# Patient Record
Sex: Female | Born: 1960
Health system: Southern US, Community
[De-identification: ages and names within clinical notes are randomized; demographics above are authoritative.]

## PROBLEM LIST (undated history)

## (undated) DIAGNOSIS — I499 Cardiac arrhythmia, unspecified: Secondary | ICD-10-CM

## (undated) DIAGNOSIS — R569 Unspecified convulsions: Secondary | ICD-10-CM

## (undated) DIAGNOSIS — C801 Malignant (primary) neoplasm, unspecified: Secondary | ICD-10-CM

## (undated) DIAGNOSIS — E079 Disorder of thyroid, unspecified: Secondary | ICD-10-CM

## (undated) DIAGNOSIS — E039 Hypothyroidism, unspecified: Secondary | ICD-10-CM

## (undated) DIAGNOSIS — T753XXA Motion sickness, initial encounter: Secondary | ICD-10-CM

## (undated) HISTORY — PX: BREAST CYST ASPIRATION: SHX578

## (undated) HISTORY — PX: VAGINAL HYSTERECTOMY: SUR661

## (undated) HISTORY — PX: APPENDECTOMY: SHX54

## (undated) HISTORY — PX: THYROIDECTOMY: SHX17

## (undated) HISTORY — DX: Disorder of thyroid, unspecified: E07.9

## (undated) HISTORY — DX: Unspecified convulsions: R56.9

---

## 2014-03-07 ENCOUNTER — Ambulatory Visit: Payer: Self-pay | Admitting: Emergency Medicine

## 2014-12-07 ENCOUNTER — Ambulatory Visit (INDEPENDENT_AMBULATORY_CARE_PROVIDER_SITE_OTHER): Payer: 59 | Admitting: Family Medicine

## 2014-12-07 ENCOUNTER — Encounter: Payer: Self-pay | Admitting: Family Medicine

## 2014-12-07 VITALS — BP 130/80 | HR 72 | Ht 67.0 in | Wt 240.0 lb

## 2014-12-07 DIAGNOSIS — Z1211 Encounter for screening for malignant neoplasm of colon: Secondary | ICD-10-CM

## 2014-12-07 DIAGNOSIS — G40A09 Absence epileptic syndrome, not intractable, without status epilepticus: Secondary | ICD-10-CM

## 2014-12-07 DIAGNOSIS — R69 Illness, unspecified: Secondary | ICD-10-CM

## 2014-12-07 DIAGNOSIS — E039 Hypothyroidism, unspecified: Secondary | ICD-10-CM

## 2014-12-07 DIAGNOSIS — Z1239 Encounter for other screening for malignant neoplasm of breast: Secondary | ICD-10-CM | POA: Diagnosis not present

## 2014-12-07 MED ORDER — LEVOTHYROXINE SODIUM 150 MCG PO TABS: 150.0000 ug | ORAL_TABLET | ORAL | Status: DC

## 2014-12-07 MED ORDER — DIVALPROEX SODIUM 125 MG PO DR TAB: 125.0000 mg | DELAYED_RELEASE_TABLET | Freq: Every day | ORAL | Status: DC

## 2014-12-07 MED ORDER — DIVALPROEX SODIUM ER 500 MG PO TB24: 500.0000 mg | ORAL_TABLET | Freq: Every day | ORAL | Status: DC

## 2014-12-07 MED ORDER — LEVOTHYROXINE SODIUM 175 MCG PO TABS: 175.0000 ug | ORAL_TABLET | ORAL | Status: DC

## 2014-12-07 NOTE — Progress Notes (Signed)
Name: Karen Franco   MRN: [id number]    DOB: 1960/11/19   Date:12/07/2014       Progress Note  Subjective  Chief Complaint  Chief Complaint  Patient presents with  . Seizures  . Thyroid Problem    Seizures  This is a chronic problem. The problem has not changed (stable) since onset.Associated symptoms include confusion and nausea. Pertinent negatives include no headaches, no sore throat, no chest pain, no cough and no diarrhea. nausea/ uable to speak/ postictal stage The seizure(s) had no focality.  Thyroid Problem Presents for follow-up visit. Symptoms include weight gain. Patient reports no anxiety, cold intolerance, constipation, depressed mood, diaphoresis, diarrhea, dry skin, fatigue, hair loss, heat intolerance, hoarse voice, leg swelling, nail problem, palpitations, tremors, visual change or weight loss. The symptoms have been stable. Past treatments include levothyroxine. There are no known risk factors.    No problem-specific assessment & plan notes found for this encounter.   Past Medical History  Diagnosis Date  . Thyroid disease   . Seizures     Past Surgical History  Procedure Laterality Date  . Vaginal hysterectomy    . Cesarean section      x 2  . Appendectomy    . Thyroidectomy      Family History  Problem Relation Age of Onset  . Cancer Mother   . Heart disease Father   . Diabetes Maternal Grandfather     History   Social History  . Marital Status: Married    Spouse Name: N/A  . Number of Children: N/A  . Years of Education: N/A   Occupational History  . Not on file.   Social History Main Topics  . Smoking status: Never Smoker   . Smokeless tobacco: Not on file  . Alcohol Use: 0.0 oz/week    0 Standard drinks or equivalent per week  . Drug Use: No  . Sexual Activity: Yes   Other Topics Concern  . Not on file   Social History Narrative  . No narrative on file    No Known Allergies   Review of Systems  Constitutional:  Positive for weight gain. Negative for fever, chills, weight loss, malaise/fatigue, diaphoresis and fatigue.  HENT: Negative for ear discharge, ear pain, hoarse voice and sore throat.   Eyes: Negative for blurred vision.  Respiratory: Negative for cough, sputum production, shortness of breath and wheezing.   Cardiovascular: Negative for chest pain, palpitations and leg swelling.  Gastrointestinal: Positive for nausea. Negative for heartburn, abdominal pain, diarrhea, constipation, blood in stool and melena.  Genitourinary: Negative for dysuria, urgency, frequency and hematuria.  Musculoskeletal: Positive for back pain. Negative for myalgias, joint pain and neck pain.  Skin: Negative for rash.  Neurological: Positive for seizures. Negative for dizziness, tingling, tremors, sensory change, focal weakness and headaches.  Endo/Heme/Allergies: Negative for environmental allergies, cold intolerance, heat intolerance and polydipsia. Does not bruise/bleed easily.  Psychiatric/Behavioral: Positive for confusion. Negative for depression and suicidal ideas. The patient is not nervous/anxious and does not have insomnia.      Objective  Filed Vitals:   12/07/14 0809  BP: 130/80  Pulse: 72  Height: 5\' 7"  (1.702 m)  Weight: 240 lb (108.863 kg)    Physical Exam  Constitutional: She is well-developed, well-nourished, and in no distress. No distress.  HENT:  Head: Normocephalic and atraumatic.  Right Ear: External ear normal.  Left Ear: External ear normal.  Nose: Nose normal.  Mouth/Throat: Oropharynx is clear and moist.  Eyes: Conjunctivae and EOM are normal. Pupils are equal, round, and reactive to light. Right eye exhibits no discharge. Left eye exhibits no discharge.  Neck: Normal range of motion. Neck supple. No JVD present. No thyromegaly present.  Cardiovascular: Normal rate, regular rhythm, normal heart sounds and intact distal pulses.  Exam reveals no gallop and no friction rub.   No  murmur heard. Pulmonary/Chest: Effort normal and breath sounds normal. Right breast exhibits no inverted nipple, no mass and no skin change. Left breast exhibits no inverted nipple, no mass and no skin change. Breasts are symmetrical.  Abdominal: Soft. Bowel sounds are normal. She exhibits no mass. There is no tenderness. There is no guarding.  Musculoskeletal: Normal range of motion. She exhibits no edema.  Lymphadenopathy:    She has no cervical adenopathy.  Neurological: She is alert. She has normal reflexes.  Skin: Skin is warm and dry. She is not diaphoretic.  Psychiatric: Mood and affect normal.      Assessment & Plan  Problem List Items Addressed This Visit      Endocrine   Acquired hypothyroidism - Primary   Relevant Medications   levothyroxine (SYNTHROID, LEVOTHROID) 150 MCG tablet   levothyroxine (SYNTHROID, LEVOTHROID) 175 MCG tablet   Other Relevant Orders   TSH     Nervous and Auditory   Absence seizure disorder   Relevant Medications   divalproex (DEPAKOTE ER) 500 MG 24 hr tablet   divalproex (DEPAKOTE) 125 MG DR tablet   Other Relevant Orders   Valproic Acid level     Other   Taking medication for chronic disease   Relevant Orders   Renal Function Panel   Lipid Profile   CBC   Hepatic function panel   Valproic Acid level    Other Visit Diagnoses    Breast cancer screening        Relevant Orders    MM Digital Screening    Colon cancer screening        Relevant Orders    Ambulatory referral to Gastroenterology         Dr. Otilio Miu [facility]  12/07/2014

## 2014-12-10 LAB — HEPATIC FUNCTION PANEL
ALT: 22 IU/L (ref 0–32)
AST: 19 IU/L (ref 0–40)
Alkaline Phosphatase: 84 IU/L (ref 39–117)
Bilirubin Total: 0.3 mg/dL (ref 0.0–1.2)
Bilirubin, Direct: 0.08 mg/dL (ref 0.00–0.40)
Total Protein: 6.4 g/dL (ref 6.0–8.5)

## 2014-12-10 LAB — RENAL FUNCTION PANEL
Albumin: 3.8 g/dL (ref 3.5–5.5)
BUN/Creatinine Ratio: 19 (ref 9–23)
BUN: 12 mg/dL (ref 6–24)
CO2: 26 mmol/L (ref 18–29)
Calcium: 9.2 mg/dL (ref 8.7–10.2)
Chloride: 97 mmol/L (ref 97–108)
Creatinine, Ser: 0.63 mg/dL (ref 0.57–1.00)
GFR calc Af Amer: 119 mL/min/{1.73_m2} (ref >59)
GFR, EST NON AFRICAN AMERICAN: 103 mL/min/{1.73_m2} (ref >59)
Glucose: 83 mg/dL (ref 65–99)
Phosphorus: 3.5 mg/dL (ref 2.5–4.5)
Potassium: 5.3 mmol/L — ABNORMAL HIGH (ref 3.5–5.2)
SODIUM: 138 mmol/L (ref 134–144)

## 2014-12-10 LAB — LIPID PANEL
CHOLESTEROL TOTAL: 257 mg/dL — AB (ref 100–199)
Chol/HDL Ratio: 3.9 ratio units (ref 0.0–4.4)
HDL: 66 mg/dL (ref >39)
LDL Calculated: 171 mg/dL — ABNORMAL HIGH (ref 0–99)
Triglycerides: 101 mg/dL (ref 0–149)
VLDL Cholesterol Cal: 20 mg/dL (ref 5–40)

## 2014-12-10 LAB — CBC
HEMOGLOBIN: 14 g/dL (ref 11.1–15.9)
Hematocrit: 42.4 % (ref 34.0–46.6)
MCH: 28.9 pg (ref 26.6–33.0)
MCHC: 33 g/dL (ref 31.5–35.7)
MCV: 88 fL (ref 79–97)
Platelets: 418 10*3/uL — ABNORMAL HIGH (ref 150–379)
RBC: 4.84 x10E6/uL (ref 3.77–5.28)
RDW: 14 % (ref 12.3–15.4)
WBC: 7.8 10*3/uL (ref 3.4–10.8)

## 2014-12-10 LAB — VALPROIC ACID LEVEL: Valproic Acid Lvl: 24 ug/mL — ABNORMAL LOW (ref 50–100)

## 2014-12-10 LAB — TSH: TSH: 0.509 u[IU]/mL (ref 0.450–4.500)

## 2014-12-14 ENCOUNTER — Telehealth: Payer: Self-pay | Admitting: Gastroenterology

## 2014-12-14 ENCOUNTER — Ambulatory Visit
Admission: RE | Admit: 2014-12-14 | Discharge: 2014-12-14 | Disposition: A | Discharge status: Home / Self Care | Payer: 59 | Source: Ambulatory Visit | Attending: Family Medicine | Admitting: Family Medicine

## 2014-12-14 DIAGNOSIS — Z1239 Encounter for other screening for malignant neoplasm of breast: Secondary | ICD-10-CM

## 2014-12-14 DIAGNOSIS — Z1231 Encounter for screening mammogram for malignant neoplasm of breast: Secondary | ICD-10-CM | POA: Diagnosis present

## 2014-12-14 NOTE — Telephone Encounter (Signed)
Patient returning your phone call regarding colonoscopy triage

## 2014-12-16 ENCOUNTER — Other Ambulatory Visit: Payer: Self-pay

## 2014-12-19 NOTE — Telephone Encounter (Signed)
Phoned patient for colon triage, No answer will mail letter

## 2014-12-26 ENCOUNTER — Other Ambulatory Visit: Payer: Self-pay

## 2014-12-26 DIAGNOSIS — Z1212 Encounter for screening for malignant neoplasm of rectum: Principal | ICD-10-CM

## 2014-12-26 DIAGNOSIS — Z1211 Encounter for screening for malignant neoplasm of colon: Secondary | ICD-10-CM

## 2014-12-26 NOTE — Progress Notes (Signed)
Gastroenterology Pre-Procedure Review  Request Date: 01-20-15 Requesting Physician: Dr. Otilio Miu  PATIENT REVIEW QUESTIONS: The patient responded to the following health history questions as indicated:    1. Are you having any GI issues? no 2. Do you have a personal history of Polyps? no 3. Do you have a family history of Colon Cancer or Polyps? no 4. Diabetes Mellitus? no 5. Joint replacements in the past 12 months?no 6. Major health problems in the past 3 months?no 7. Any artificial heart valves, MVP, or defibrillator?no    MEDICATIONS & ALLERGIES:    Patient reports the following regarding taking any anticoagulation/antiplatelet therapy:   Plavix, Coumadin, Eliquis, Xarelto, Lovenox, Pradaxa, Brilinta, or Effient? no Aspirin? no  Patient confirms/reports the following medications:  Current Outpatient Prescriptions  Medication Sig Dispense Refill  . Calcium Carbonate-Vitamin D (CALCIUM-VITAMIN D) 500-200 MG-UNIT per tablet Take 1 tablet by mouth daily.    . divalproex (DEPAKOTE ER) 500 MG 24 hr tablet Take 1 tablet (500 mg total) by mouth daily. 30 tablet 5  . divalproex (DEPAKOTE) 125 MG DR tablet Take 1 tablet (125 mg total) by mouth daily. 30 tablet 5  . levothyroxine (SYNTHROID, LEVOTHROID) 150 MCG tablet Take 1 tablet (150 mcg total) by mouth 3 (three) times a week. 22 tablet 5  . levothyroxine (SYNTHROID, LEVOTHROID) 175 MCG tablet Take 1 tablet (175 mcg total) by mouth 4 (four) times a week. 30 tablet 5  . Multiple Vitamin (MULTIVITAMIN) capsule Take 1 capsule by mouth daily.    . vitamin B-12 (CYANOCOBALAMIN) 1000 MCG tablet Take 1,000 mcg by mouth daily.     No current facility-administered medications for this visit.    Patient confirms/reports the following allergies:  No Known Allergies  No orders of the defined types were placed in this encounter.    AUTHORIZATION INFORMATION Primary Insurance: 1D#: Group #:  Secondary Insurance: 1D#: Group  #:  SCHEDULE INFORMATION: Date: 01-20-15 Time: Location: [place]

## 2015-01-05 ENCOUNTER — Other Ambulatory Visit: Payer: Self-pay

## 2015-01-10 ENCOUNTER — Encounter: Payer: Self-pay | Admitting: *Deleted

## 2015-01-18 NOTE — Discharge Instructions (Signed)

## 2015-01-20 ENCOUNTER — Other Ambulatory Visit: Payer: Self-pay | Admitting: Gastroenterology

## 2015-01-20 ENCOUNTER — Ambulatory Visit: Payer: 59 | Admitting: Anesthesiology

## 2015-01-20 ENCOUNTER — Encounter
Admission: RE | Disposition: A | Discharge status: Home / Self Care | Payer: Self-pay | Source: Ambulatory Visit | Attending: Gastroenterology

## 2015-01-20 ENCOUNTER — Ambulatory Visit
Admission: RE | Admit: 2015-01-20 | Discharge: 2015-01-20 | Disposition: A | Discharge status: Home / Self Care | Payer: 59 | Source: Ambulatory Visit | Attending: Gastroenterology | Admitting: Gastroenterology

## 2015-01-20 DIAGNOSIS — R002 Palpitations: Secondary | ICD-10-CM | POA: Diagnosis not present

## 2015-01-20 DIAGNOSIS — D122 Benign neoplasm of ascending colon: Secondary | ICD-10-CM | POA: Insufficient documentation

## 2015-01-20 DIAGNOSIS — Z803 Family history of malignant neoplasm of breast: Secondary | ICD-10-CM | POA: Insufficient documentation

## 2015-01-20 DIAGNOSIS — Z79899 Other long term (current) drug therapy: Secondary | ICD-10-CM | POA: Insufficient documentation

## 2015-01-20 DIAGNOSIS — K640 First degree hemorrhoids: Secondary | ICD-10-CM | POA: Diagnosis not present

## 2015-01-20 DIAGNOSIS — E039 Hypothyroidism, unspecified: Secondary | ICD-10-CM | POA: Insufficient documentation

## 2015-01-20 DIAGNOSIS — Z833 Family history of diabetes mellitus: Secondary | ICD-10-CM | POA: Diagnosis not present

## 2015-01-20 DIAGNOSIS — Z6837 Body mass index (BMI) 37.0-37.9, adult: Secondary | ICD-10-CM | POA: Insufficient documentation

## 2015-01-20 DIAGNOSIS — Z8249 Family history of ischemic heart disease and other diseases of the circulatory system: Secondary | ICD-10-CM | POA: Insufficient documentation

## 2015-01-20 DIAGNOSIS — Z90710 Acquired absence of both cervix and uterus: Secondary | ICD-10-CM | POA: Diagnosis not present

## 2015-01-20 DIAGNOSIS — Z1211 Encounter for screening for malignant neoplasm of colon: Secondary | ICD-10-CM | POA: Diagnosis not present

## 2015-01-20 HISTORY — PX: POLYPECTOMY: SHX5525

## 2015-01-20 HISTORY — DX: Hypothyroidism, unspecified: E03.9

## 2015-01-20 HISTORY — DX: Motion sickness, initial encounter: T75.3XXA

## 2015-01-20 HISTORY — DX: Cardiac arrhythmia, unspecified: I49.9

## 2015-01-20 HISTORY — PX: COLONOSCOPY WITH PROPOFOL: SHX5780

## 2015-01-20 SURGERY — COLONOSCOPY WITH PROPOFOL
Anesthesia: Monitor Anesthesia Care | Wound class: Contaminated

## 2015-01-20 MED ORDER — SODIUM CHLORIDE 0.9 % IV SOLN: INTRAVENOUS | Status: DC

## 2015-01-20 MED ORDER — LIDOCAINE HCL (CARDIAC) 20 MG/ML IV SOLN
INTRAVENOUS | Status: DC | PRN
Start: 2015-01-20 — End: 2015-01-20
  Administered 2015-01-20: 50 mg via INTRAVENOUS

## 2015-01-20 MED ORDER — OXYCODONE HCL 5 MG PO TABS: 5.0000 mg | ORAL_TABLET | Freq: Once | ORAL | Status: DC | PRN

## 2015-01-20 MED ORDER — PROPOFOL 10 MG/ML IV BOLUS
INTRAVENOUS | Status: DC | PRN
  Administered 2015-01-20: 40 mg via INTRAVENOUS
  Administered 2015-01-20: 60 mg via INTRAVENOUS
  Administered 2015-01-20: 20 mg via INTRAVENOUS
  Administered 2015-01-20 (×4): 30 mg via INTRAVENOUS

## 2015-01-20 MED ORDER — LACTATED RINGERS IV SOLN
INTRAVENOUS | Status: DC
  Administered 2015-01-20: 09:00:00 via INTRAVENOUS

## 2015-01-20 MED ORDER — OXYCODONE HCL 5 MG/5ML PO SOLN: 5.0000 mg | Freq: Once | ORAL | Status: DC | PRN

## 2015-01-20 MED ORDER — STERILE WATER FOR IRRIGATION IR SOLN
Status: DC | PRN
  Administered 2015-01-20: 09:00:00

## 2015-01-20 SURGICAL SUPPLY — 28 items
CANISTER SUCT 1200ML W/VALVE (MISCELLANEOUS) ×4 IMPLANT
FCP ESCP3.2XJMB 240X2.8X (MISCELLANEOUS)
FORCEPS BIOP RAD 4 LRG CAP 4 (CUTTING FORCEPS) IMPLANT
FORCEPS BIOP RJ4 240 W/NDL (MISCELLANEOUS)
FORCEPS ESCP3.2XJMB 240X2.8X (MISCELLANEOUS) IMPLANT
GOWN CVR UNV OPN BCK APRN NK (MISCELLANEOUS) ×4 IMPLANT
GOWN ISOL THUMB LOOP REG UNIV (MISCELLANEOUS) ×4
HEMOCLIP INSTINCT (CLIP) IMPLANT
INJECTOR VARIJECT VIN23 (MISCELLANEOUS) IMPLANT
KIT CO2 TUBING (TUBING) IMPLANT
KIT DEFENDO VALVE AND CONN (KITS) IMPLANT
KIT ENDO PROCEDURE OLY (KITS) ×4 IMPLANT
LIGATOR MULTIBAND 6SHOOTER MBL (MISCELLANEOUS) IMPLANT
MARKER SPOT ENDO TATTOO 5ML (MISCELLANEOUS) IMPLANT
PAD GROUND ADULT SPLIT (MISCELLANEOUS) IMPLANT
SNARE SHORT THROW 13M SML OVAL (MISCELLANEOUS) ×4 IMPLANT
SNARE SHORT THROW 30M LRG OVAL (MISCELLANEOUS) IMPLANT
SPOT EX ENDOSCOPIC TATTOO (MISCELLANEOUS)
SUCTION POLY TRAP 4CHAMBER (MISCELLANEOUS) IMPLANT
TRAP SUCTION POLY (MISCELLANEOUS) ×4 IMPLANT
TUBING CONN 6MMX3.1M (TUBING)
TUBING SUCTION CONN 0.25 STRL (TUBING) IMPLANT
UNDERPAD 30X60 958B10 (PK) (MISCELLANEOUS) IMPLANT
VALVE BIOPSY ENDO (VALVE) IMPLANT
VARIJECT INJECTOR VIN23 (MISCELLANEOUS)
WATER AUXILLARY (MISCELLANEOUS) IMPLANT
WATER STERILE IRR 250ML POUR (IV SOLUTION) ×4 IMPLANT
WATER STERILE IRR 500ML POUR (IV SOLUTION) IMPLANT

## 2015-01-20 NOTE — Anesthesia Procedure Notes (Signed)
Procedure Name: MAC Performed by: Syretta Kochel Pre-anesthesia Checklist: Patient identified, Emergency Drugs available, Suction available, Timeout performed and Patient being monitored Patient Re-evaluated:Patient Re-evaluated prior to inductionOxygen Delivery Method: Nasal cannula Placement Confirmation: positive ETCO2       

## 2015-01-20 NOTE — Anesthesia Preprocedure Evaluation (Signed)
Anesthesia Evaluation  Patient identified by MRN, date of birth, ID band  Reviewed: NPO status   History of Anesthesia Complications Negative for: history of anesthetic complications  Airway Mallampati: II  TM Distance: >3 FB Neck ROM: full    Dental no notable dental hx.    Pulmonary neg pulmonary ROS,    Pulmonary exam normal        Cardiovascular Exercise Tolerance: Good negative cardio ROS Normal cardiovascular exam     Neuro/Psych Seizures - (petit mal sz under control),  negative psych ROS   GI/Hepatic negative GI ROS, Neg liver ROS,   Endo/Other  Hypothyroidism Morbid obesity (bmi=37)  Renal/GU negative Renal ROS  negative genitourinary   Musculoskeletal   Abdominal   Peds  Hematology negative hematology ROS (+)   Anesthesia Other Findings   Reproductive/Obstetrics                             Anesthesia Physical Anesthesia Plan  ASA: II  Anesthesia Plan: MAC   Post-op Pain Management:    Induction:   Airway Management Planned:   Additional Equipment:   Intra-op Plan:   Post-operative Plan:   Informed Consent: I have reviewed the patients History and Physical, chart, labs and discussed the procedure including the risks, benefits and alternatives for the proposed anesthesia with the patient or authorized representative who has indicated his/her understanding and acceptance.     Plan Discussed with: CRNA  Anesthesia Plan Comments:         Anesthesia Quick Evaluation

## 2015-01-20 NOTE — Op Note (Signed)
[facility] Patient Name: Karen Franco Procedure Date: 01/20/2015 9:18 AM MRN: [id number] Account #: [id number] Date of Birth: 01-13-1961 Admit Type: Outpatient Age: 54 Room: [facility] OR ROOM 01 Gender: Female Note Status: Finalized Procedure:         Colonoscopy Indications:       Screening for colorectal malignant neoplasm Providers:         Lucilla Lame, MD Referring MD:      Juline Patch, MD (Referring MD) Medicines:         Propofol per Anesthesia Complications:     No immediate complications. Procedure:         Pre-Anesthesia Assessment:                    - Prior to the procedure, a History and Physical was                     performed, and patient medications and allergies were                     reviewed. The patient's tolerance of previous anesthesia                     was also reviewed. The risks and benefits of the procedure                     and the sedation options and risks were discussed with the                     patient. All questions were answered, and informed consent                     was obtained. Prior Anticoagulants: The patient has taken                     no previous anticoagulant or antiplatelet agents. ASA                     Grade Assessment: II - A patient with mild systemic                     disease. After reviewing the risks and benefits, the                     patient was deemed in satisfactory condition to undergo                     the procedure.                    After obtaining informed consent, the colonoscope was                     passed under direct vision. Throughout the procedure, the                     patient's blood pressure, pulse, and oxygen saturations                     were monitored continuously. The was introduced through                     the anus and advanced to the the cecum, identified by  appendiceal orifice and ileocecal valve. The colonoscopy               was performed without difficulty. The patient tolerated                     the procedure well. The quality of the bowel preparation                     was excellent. Findings:      The perianal and digital rectal examinations were normal.      A 4 mm polyp was found in the ascending colon. The polyp was sessile.       The polyp was removed with a cold snare. Resection and retrieval were       complete.      Non-bleeding internal hemorrhoids were found during retroflexion. The       hemorrhoids were Grade I (internal hemorrhoids that do not prolapse). Impression:        - One 4 mm polyp in the ascending colon. Resected and                     retrieved.                    - Non-bleeding internal hemorrhoids. Recommendation:    - Await pathology results.                    - Repeat colonoscopy in 5 years if polyp adenoma and 10                     years if hyperplastic Procedure Code(s): --- Professional ---                    [phone], Colonoscopy, flexible; with removal of tumor(s),                     polyp(s), or other lesion(s) by snare technique Diagnosis Code(s): --- Professional ---                    Z12.11, Encounter for screening for malignant neoplasm of                     colon                    D12.2, Benign neoplasm of ascending colon CPT copyright 2014 American Medical Association. All rights reserved. The codes documented in this report are preliminary and upon coder review may  be revised to meet current compliance requirements. Lucilla Lame, MD 01/20/2015 9:42:25 AM This report has been signed electronically. Number of Addenda: 0 Note Initiated On: 01/20/2015 9:18 AM Scope Withdrawal Time: 0 hours 8 minutes 6 seconds  Total Procedure Duration: 0 hours 10 minutes 56 seconds       [facility]

## 2015-01-20 NOTE — Anesthesia Postprocedure Evaluation (Signed)
  Anesthesia Post-op Note  Patient: Karen Franco  Procedure(s) Performed: Procedure(s): COLONOSCOPY WITH PROPOFOL (N/A) POLYPECTOMY  Anesthesia type:MAC  Patient location: PACU  Post pain: Pain level controlled  Post assessment: Post-op Vital signs reviewed, Patient's Cardiovascular Status Stable, Respiratory Function Stable, Patent Airway and No signs of Nausea or vomiting  Post vital signs: Reviewed and stable  Last Vitals:  Filed Vitals:   01/20/15 0941  Pulse: 79  Temp: 36.4 C  Resp: 20    Level of consciousness: awake, alert  and patient cooperative  Complications: No apparent anesthesia complications

## 2015-01-20 NOTE — Transfer of Care (Signed)
Immediate Anesthesia Transfer of Care Note  Patient: Karen Franco  Procedure(s) Performed: Procedure(s): COLONOSCOPY WITH PROPOFOL (N/A) POLYPECTOMY  Patient Location: PACU  Anesthesia Type: MAC  Level of Consciousness: awake, alert  and patient cooperative  Airway and Oxygen Therapy: Patient Spontanous Breathing and Patient connected to supplemental oxygen  Post-op Assessment: Post-op Vital signs reviewed, Patient's Cardiovascular Status Stable, Respiratory Function Stable, Patent Airway and No signs of Nausea or vomiting  Post-op Vital Signs: Reviewed and stable  Complications: No apparent anesthesia complications

## 2015-01-20 NOTE — H&P (Signed)
[facility]  [address] [place] [employer], [place] [address] Phone: [phone] Fax : [phone]  Primary Care Physician:  Otilio Miu, MD Primary Gastroenterologist:  Dr. Allen Norris  Pre-Procedure History & Physical: HPI:  Karen Franco is a 54 y.o. female is here for a screening colonoscopy.   Past Medical History  Diagnosis Date  . Thyroid disease   . Seizures     22 yrs ago - controlled on Depakote  . Dysrhythmia     palpatations if thyroid meds are "off"  . Hypothyroidism   . Motion sickness     cars - back seat    Past Surgical History  Procedure Laterality Date  . Vaginal hysterectomy    . Cesarean section      x 2  . Appendectomy    . Thyroidectomy    . Breast cyst aspiration      not sure which side-neg    Prior to Admission medications   Medication Sig Start Date End Date Taking? Authorizing Provider  Calcium Carbonate-Vitamin D (CALCIUM-VITAMIN D) 500-200 MG-UNIT per tablet Take 1 tablet by mouth daily.   Yes Historical Provider, MD  divalproex (DEPAKOTE ER) 500 MG 24 hr tablet Take 1 tablet (500 mg total) by mouth daily. 12/07/14  Yes Juline Patch, MD  divalproex (DEPAKOTE) 125 MG DR tablet Take 1 tablet (125 mg total) by mouth daily. 12/07/14  Yes Juline Patch, MD  levothyroxine (SYNTHROID, LEVOTHROID) 150 MCG tablet Take 1 tablet (150 mcg total) by mouth 3 (three) times a week. 12/07/14  Yes Juline Patch, MD  levothyroxine (SYNTHROID, LEVOTHROID) 175 MCG tablet Take 1 tablet (175 mcg total) by mouth 4 (four) times a week. 12/07/14  Yes Juline Patch, MD  Multiple Vitamin (MULTIVITAMIN) capsule Take 1 capsule by mouth daily.   Yes Historical Provider, MD  vitamin B-12 (CYANOCOBALAMIN) 1000 MCG tablet Take 1,000 mcg by mouth daily.   Yes Historical Provider, MD    Allergies as of 12/26/2014  . (No Known Allergies)    Family History  Problem Relation Age of Onset  . Cancer Mother   . Heart disease Father   . Diabetes Maternal  Grandfather   . Breast cancer Paternal Aunt     Social History   Social History  . Marital Status: Married    Spouse Name: N/A  . Number of Children: N/A  . Years of Education: N/A   Occupational History  . Not on file.   Social History Main Topics  . Smoking status: Never Smoker   . Smokeless tobacco: Not on file  . Alcohol Use: 0.0 oz/week    0 Standard drinks or equivalent per week     Comment: 1 glass wine/month  . Drug Use: No  . Sexual Activity: Yes   Other Topics Concern  . Not on file   Social History Narrative    Review of Systems: See HPI, otherwise negative ROS  Physical Exam: Temp(Src) 97.9 F (36.6 C) (Tympanic)  Resp 16  Ht 5\' 7"  (1.702 m)  Wt 239 lb (108.41 kg)  BMI 37.42 kg/m2 General:   Alert,  pleasant and cooperative in NAD Head:  Normocephalic and atraumatic. Neck:  Supple; no masses or thyromegaly. Lungs:  Clear throughout to auscultation.    Heart:  Regular rate and rhythm. Abdomen:  Soft, nontender and nondistended. Normal bowel sounds, without guarding, and without rebound.   Neurologic:  Alert and  oriented x4;  grossly normal neurologically.  Impression/Plan: Karen Franco  Karen Franco is now here to undergo a screening colonoscopy.  Risks, benefits, and alternatives regarding colonoscopy have been reviewed with the patient.  Questions have been answered.  All parties agreeable.

## 2015-01-23 ENCOUNTER — Encounter: Payer: Self-pay | Admitting: Gastroenterology

## 2015-05-14 HISTORY — PX: MOHS SURGERY: SHX181

## 2015-06-09 ENCOUNTER — Ambulatory Visit
Admission: RE | Admit: 2015-06-09 | Discharge: 2015-06-09 | Disposition: A | Discharge status: Home / Self Care | Payer: 59 | Source: Ambulatory Visit | Attending: Family Medicine | Admitting: Family Medicine

## 2015-06-09 ENCOUNTER — Ambulatory Visit (INDEPENDENT_AMBULATORY_CARE_PROVIDER_SITE_OTHER): Payer: 59 | Admitting: Family Medicine

## 2015-06-09 ENCOUNTER — Encounter: Payer: Self-pay | Admitting: Family Medicine

## 2015-06-09 ENCOUNTER — Ambulatory Visit: Payer: Self-pay | Admitting: Family Medicine

## 2015-06-09 VITALS — BP 130/90 | HR 78 | Ht 67.0 in | Wt 249.0 lb

## 2015-06-09 DIAGNOSIS — E039 Hypothyroidism, unspecified: Secondary | ICD-10-CM | POA: Diagnosis not present

## 2015-06-09 DIAGNOSIS — M533 Sacrococcygeal disorders, not elsewhere classified: Secondary | ICD-10-CM | POA: Diagnosis present

## 2015-06-09 DIAGNOSIS — M519 Unspecified thoracic, thoracolumbar and lumbosacral intervertebral disc disorder: Secondary | ICD-10-CM

## 2015-06-09 DIAGNOSIS — G40909 Epilepsy, unspecified, not intractable, without status epilepticus: Secondary | ICD-10-CM | POA: Diagnosis not present

## 2015-06-09 DIAGNOSIS — G40A09 Absence epileptic syndrome, not intractable, without status epilepticus: Secondary | ICD-10-CM

## 2015-06-09 DIAGNOSIS — M4647 Discitis, unspecified, lumbosacral region: Secondary | ICD-10-CM | POA: Diagnosis present

## 2015-06-09 MED ORDER — DIVALPROEX SODIUM 125 MG PO DR TAB: 125.0000 mg | DELAYED_RELEASE_TABLET | Freq: Every day | ORAL | Status: DC

## 2015-06-09 MED ORDER — DIVALPROEX SODIUM ER 500 MG PO TB24: 500.0000 mg | ORAL_TABLET | Freq: Every day | ORAL | Status: DC

## 2015-06-09 MED ORDER — LEVOTHYROXINE SODIUM 175 MCG PO TABS: 175.0000 ug | ORAL_TABLET | ORAL | Status: DC

## 2015-06-09 MED ORDER — LEVOTHYROXINE SODIUM 150 MCG PO TABS: 150.0000 ug | ORAL_TABLET | ORAL | Status: DC

## 2015-06-09 NOTE — Progress Notes (Signed)
Name: Karen Franco   MRN: [id number]    DOB: 16-Jan-1961   Date:06/09/2015       Progress Note  Subjective  Chief Complaint  Chief Complaint  Patient presents with  . Seizures  . Hypothyroidism  . Back Pain    constant pain in tailbone area. Standing up helps pain    Seizures  This is a chronic problem. The current episode started more than 1 week ago. The problem has been gradually improving. Pertinent negatives include no sleepiness, no confusion, no headaches, no speech difficulty, no visual disturbance, no neck stiffness, no sore throat, no chest pain, no cough, no nausea, no vomiting, no diarrhea and no muscle weakness. Characteristics do not include eye blinking, eye deviation, bowel incontinence, bladder incontinence, rhythmic jerking, loss of consciousness, bit tongue, apnea or cyanosis. The seizure(s) had no focality. There has been no fever.  Back Pain This is a recurrent problem. The current episode started more than 1 year ago. The problem occurs daily. The problem has been gradually worsening since onset. The pain is present in the sacro-iliac and lumbar spine. The quality of the pain is described as aching. The pain is at a severity of 3/10. The symptoms are aggravated by bending. Pertinent negatives include no abdominal pain, bladder incontinence, bowel incontinence, chest pain, dysuria, fever, headaches, leg pain, numbness, paresis, pelvic pain, perianal numbness, tingling, weakness or weight loss. She has tried analgesics and chiropractic manipulation for the symptoms. The treatment provided no relief.  Thyroid Problem Patient reports no anxiety, cold intolerance, constipation, depressed mood, diaphoresis, diarrhea, dry skin, fatigue, hair loss, heat intolerance, hoarse voice, leg swelling, menstrual problem, nail problem, palpitations, tremors, visual change, weight gain or weight loss. The symptoms have been stable. Past treatments include nothing. The treatment provided  mild relief. There is no history of atrial fibrillation, dementia, diabetes, Graves' ophthalmopathy, heart failure, hyperlipidemia, neuropathy, obesity or osteopenia.    No problem-specific assessment & plan notes found for this encounter.   Past Medical History  Diagnosis Date  . Thyroid disease   . Seizures ([place])     22 yrs ago - controlled on Depakote  . Dysrhythmia     palpatations if thyroid meds are "off"  . Hypothyroidism   . Motion sickness     cars - back seat    Past Surgical History  Procedure Laterality Date  . Vaginal hysterectomy    . Cesarean section      x 2  . Appendectomy    . Thyroidectomy    . Breast cyst aspiration      not sure which side-neg  . Colonoscopy with propofol N/A 01/20/2015    Procedure: COLONOSCOPY WITH PROPOFOL;  Surgeon: Lucilla Lame, MD;  Location: [place];  Service: Endoscopy;  Laterality: N/A;  . Polypectomy  01/20/2015    Procedure: POLYPECTOMY;  Surgeon: Lucilla Lame, MD;  Location: [place];  Service: Endoscopy;;  . Mohs surgery  05/2015    L) ear    Family History  Problem Relation Age of Onset  . Cancer Mother   . Heart disease Father   . Diabetes Maternal Grandfather   . Breast cancer Paternal Aunt     Social History   Social History  . Marital Status: Married    Spouse Name: N/A  . Number of Children: N/A  . Years of Education: N/A   Occupational History  . Not on file.   Social History Main Topics  . Smoking status: Never Smoker   .  Smokeless tobacco: Not on file  . Alcohol Use: 0.0 oz/week    0 Standard drinks or equivalent per week     Comment: 1 glass wine/month  . Drug Use: No  . Sexual Activity: Yes   Other Topics Concern  . Not on file   Social History Narrative    No Known Allergies   Review of Systems  Constitutional: Negative for fever, chills, weight loss, weight gain, malaise/fatigue, diaphoresis and fatigue.  HENT: Negative for ear discharge, ear pain, hoarse voice  and sore throat.   Eyes: Negative for blurred vision and visual disturbance.  Respiratory: Negative for apnea, cough, sputum production, shortness of breath and wheezing.   Cardiovascular: Negative for chest pain, palpitations, leg swelling and cyanosis.  Gastrointestinal: Negative for heartburn, nausea, vomiting, abdominal pain, diarrhea, constipation, blood in stool, melena and bowel incontinence.  Genitourinary: Negative for bladder incontinence, dysuria, urgency, frequency, hematuria, menstrual problem and pelvic pain.  Musculoskeletal: Positive for back pain. Negative for myalgias, joint pain and neck pain.  Skin: Negative for rash.  Neurological: Positive for seizures. Negative for dizziness, tingling, tremors, sensory change, focal weakness, loss of consciousness, speech difficulty, weakness, numbness and headaches.  Endo/Heme/Allergies: Negative for environmental allergies, cold intolerance, heat intolerance and polydipsia. Does not bruise/bleed easily.  Psychiatric/Behavioral: Negative for depression, suicidal ideas and confusion. The patient is not nervous/anxious and does not have insomnia.      Objective  Filed Vitals:   06/09/15 1530  BP: 130/90  Pulse: 78  Height: 5\' 7"  (1.702 m)  Weight: 249 lb (112.946 kg)    Physical Exam  Constitutional: She is well-developed, well-nourished, and in no distress. No distress.  HENT:  Head: Normocephalic and atraumatic.  Right Ear: Tympanic membrane, external ear and ear canal normal.  Left Ear: Tympanic membrane, external ear and ear canal normal.  Nose: Nose normal.  Mouth/Throat: Oropharynx is clear and moist.  Eyes: Conjunctivae and EOM are normal. Pupils are equal, round, and reactive to light. Right eye exhibits no discharge. Left eye exhibits no discharge.  Neck: Normal range of motion. Neck supple. No JVD present. No thyromegaly present.  Cardiovascular: Normal rate, regular rhythm, normal heart sounds and intact distal  pulses.  Exam reveals no gallop and no friction rub.   No murmur heard. Pulmonary/Chest: Effort normal and breath sounds normal.  Abdominal: Soft. Bowel sounds are normal. She exhibits no mass. There is no tenderness. There is no guarding.  Musculoskeletal: Normal range of motion. She exhibits no edema.  Lymphadenopathy:    She has no cervical adenopathy.  Neurological: She is alert. She has normal reflexes.  Skin: Skin is warm and dry. She is not diaphoretic.  Psychiatric: Mood and affect normal.  Nursing note and vitals reviewed.     Assessment & Plan  Problem List Items Addressed This Visit      Endocrine   Acquired hypothyroidism - Primary   Relevant Medications   levothyroxine (SYNTHROID, LEVOTHROID) 150 MCG tablet   levothyroxine (SYNTHROID, LEVOTHROID) 175 MCG tablet   Other Relevant Orders   TSH    Other Visit Diagnoses    Seizure disorder ([place])        Sacroiliac joint pain        Relevant Orders    DG Lumbar Spine 2-3 Views    Lumbosacral disc disease        Relevant Orders    DG Lumbar Spine 2-3 Views    Absence epileptic syndrome, not intractable, without status epilepticus ([place])  Relevant Medications    divalproex (DEPAKOTE ER) 500 MG 24 hr tablet    divalproex (DEPAKOTE) 125 MG DR tablet         Dr. [facility]  06/09/2015

## 2015-06-09 NOTE — Patient Instructions (Signed)
Sacroiliac Joint Dysfunction Sacroiliac joint dysfunction is a condition that causes inflammation on one or both sides of the sacroiliac (SI) joint. The SI joint connects the lower part of the spine (sacrum) with the two upper portions of the pelvis (ilium). This condition causes deep aching or burning pain in the low back. In some cases, the pain may also spread into one or both buttocks or hips or spread down the legs. CAUSES This condition may be caused by:  Pregnancy. During pregnancy, extra stress is put on the SI joints because the pelvis widens.  Injury, such as:  Car accidents.  Sport-related injuries.  Work-related injuries.  Having one leg that is shorter than the other.  Conditions that affect the joints, such as:  Rheumatoid arthritis.  Gout.  Psoriatic arthritis.  Joint infection (septic arthritis). Sometimes, the cause of SI joint dysfunction is not known. SYMPTOMS Symptoms of this condition include:  Aching or burning pain in the lower back. The pain may also spread to other areas, such as:  Buttocks.  Groin.  Thighs and legs.  Muscle spasms in or around the painful areas.  Increased pain when standing, walking, running, stair climbing, bending, or lifting. DIAGNOSIS Your health care provider will do a physical exam and take your medical history. During the exam, the health care provider may move one or both of your legs to different positions to check for pain. Various tests may be done to help verify the diagnosis, including:  Imaging tests to look for other causes of pain. These may include:  MRI.  CT scan.  Bone scan.  Diagnostic injection. A numbing medicine is injected into the SI joint using a needle. If the pain is temporarily improved or stopped after the injection, this can indicate that SI joint dysfunction is the problem. TREATMENT Treatment may vary depending on the cause and severity of your condition. Treatment options may  include:  Applying ice or heat to the lower back area. This can help to reduce pain and muscle spasms.  Medicines to relieve pain or inflammation or to relax the muscles.  Wearing a back brace (sacroiliac brace) to help support the joint while your back is healing.  Physical therapy to increase muscle strength around the joint and flexibility at the joint. This may also involve learning proper body positions and ways of moving to relieve stress on the joint.  Direct manipulation of the SI joint.  Injections of steroid medicine into the joint in order to reduce pain and swelling.  Radiofrequency ablation to burn away nerves that are carrying pain messages from the joint.  Use of a device that provides electrical stimulation in order to reduce pain at the joint.  Surgery to put in screws and plates that limit or prevent joint motion. This is rare. HOME CARE INSTRUCTIONS  Rest as needed. Limit your activities as directed by your health care provider.  Take medicines only as directed by your health care provider.  If directed, apply ice to the affected area:  Put ice in a plastic bag.  Place a towel between your skin and the bag.  Leave the ice on for 20 minutes, 2-3 times per day.  Use a heating pad or a moist heat pack as directed by your health care provider.  Exercise as directed by your health care provider or physical therapist.  Keep all follow-up visits as directed by your health care provider. This is important. SEEK MEDICAL CARE IF:  Your pain is not controlled   with medicine.  You have a fever.  You have increasingly severe pain. SEEK IMMEDIATE MEDICAL CARE IF:  You have weakness, numbness, or tingling in your legs or feet.  You lose control of your bladder or bowel.   This information is not intended to replace advice given to you by your health care provider. Make sure you discuss any questions you have with your health care provider.   Document Released:  07/26/2008 Document Revised: 09/13/2014 Document Reviewed: 01/04/2014 Elsevier Interactive Patient Education 2016 [employer]  

## 2015-06-10 LAB — TSH: TSH: 1.33 u[IU]/mL (ref 0.450–4.500)

## 2015-06-25 ENCOUNTER — Other Ambulatory Visit: Payer: Self-pay | Admitting: Family Medicine

## 2015-11-07 ENCOUNTER — Ambulatory Visit (INDEPENDENT_AMBULATORY_CARE_PROVIDER_SITE_OTHER): Payer: 59 | Admitting: Family Medicine

## 2015-11-07 ENCOUNTER — Encounter: Payer: Self-pay | Admitting: Family Medicine

## 2015-11-07 VITALS — BP 120/88 | HR 72 | Ht 67.0 in | Wt 228.0 lb

## 2015-11-07 DIAGNOSIS — R69 Illness, unspecified: Secondary | ICD-10-CM | POA: Diagnosis not present

## 2015-11-07 DIAGNOSIS — Z1239 Encounter for other screening for malignant neoplasm of breast: Secondary | ICD-10-CM | POA: Diagnosis not present

## 2015-11-07 DIAGNOSIS — E039 Hypothyroidism, unspecified: Secondary | ICD-10-CM

## 2015-11-07 DIAGNOSIS — G40A09 Absence epileptic syndrome, not intractable, without status epilepticus: Secondary | ICD-10-CM

## 2015-11-07 MED ORDER — DIVALPROEX SODIUM 125 MG PO DR TAB: 125.0000 mg | DELAYED_RELEASE_TABLET | Freq: Every day | ORAL | Status: DC

## 2015-11-07 MED ORDER — LEVOTHYROXINE SODIUM 150 MCG PO TABS: 150.0000 ug | ORAL_TABLET | ORAL | Status: DC

## 2015-11-07 MED ORDER — DIVALPROEX SODIUM ER 500 MG PO TB24: 500.0000 mg | ORAL_TABLET | Freq: Every day | ORAL | Status: DC

## 2015-11-07 MED ORDER — LEVOTHYROXINE SODIUM 175 MCG PO TABS: 175.0000 ug | ORAL_TABLET | ORAL | Status: DC

## 2015-11-07 NOTE — Progress Notes (Signed)
Name: Karen Franco   MRN: [id number]    DOB: 02/21/1961   Date:11/07/2015       Progress Note  Subjective  Chief Complaint  Chief Complaint  Patient presents with  . Hypothyroidism  . Seizures    Seizures  This is a chronic problem. The problem has not changed since onset.Number of times: zero. Associated symptoms include speech difficulty and nausea. Pertinent negatives include no headaches, no sore throat, no chest pain, no cough and no diarrhea. nausea/ unable to talk There was the sensation of an aura present (taste/ euphoric memory).  Thyroid Problem Presents for follow-up visit. Symptoms include weight loss. Patient reports no anxiety, cold intolerance, constipation, depressed mood, diaphoresis, diarrhea, dry skin, fatigue, hair loss, heat intolerance, hoarse voice, leg swelling, nail problem, palpitations, tremors, visual change or weight gain. (Weight loss by design) The symptoms have been stable. Prior procedures include thyroidectomy.  Gastroesophageal Reflux She complains of nausea. She reports no abdominal pain, no chest pain, no coughing, no heartburn, no hoarse voice, no sore throat or no wheezing. Associated symptoms include weight loss. Pertinent negatives include no fatigue or melena.    No problem-specific assessment & plan notes found for this encounter.   Past Medical History  Diagnosis Date  . Thyroid disease   . Seizures ([place])     22 yrs ago - controlled on Depakote  . Dysrhythmia     palpatations if thyroid meds are "off"  . Hypothyroidism   . Motion sickness     cars - back seat    Past Surgical History  Procedure Laterality Date  . Vaginal hysterectomy    . Cesarean section      x 2  . Appendectomy    . Thyroidectomy    . Breast cyst aspiration      not sure which side-neg  . Colonoscopy with propofol N/A 01/20/2015    Procedure: COLONOSCOPY WITH PROPOFOL;  Surgeon: Lucilla Lame, MD;  Location: [place];  Service: Endoscopy;   Laterality: N/A;  . Polypectomy  01/20/2015    Procedure: POLYPECTOMY;  Surgeon: Lucilla Lame, MD;  Location: [place];  Service: Endoscopy;;  . Mohs surgery  05/2015    L) ear    Family History  Problem Relation Age of Onset  . Cancer Mother   . Heart disease Father   . Diabetes Maternal Grandfather   . Breast cancer Paternal Aunt     Social History   Social History  . Marital Status: Married    Spouse Name: N/A  . Number of Children: N/A  . Years of Education: N/A   Occupational History  . Not on file.   Social History Main Topics  . Smoking status: Never Smoker   . Smokeless tobacco: Not on file  . Alcohol Use: 0.0 oz/week    0 Standard drinks or equivalent per week     Comment: 1 glass wine/month  . Drug Use: No  . Sexual Activity: Yes   Other Topics Concern  . Not on file   Social History Narrative    No Known Allergies   Review of Systems  Constitutional: Positive for weight loss. Negative for fever, chills, weight gain, malaise/fatigue, diaphoresis and fatigue.  HENT: Negative for ear discharge, ear pain, hoarse voice and sore throat.   Eyes: Negative for blurred vision.  Respiratory: Negative for cough, sputum production, shortness of breath and wheezing.   Cardiovascular: Negative for chest pain, palpitations and leg swelling.  Gastrointestinal: Positive for  nausea. Negative for heartburn, abdominal pain, diarrhea, constipation, blood in stool and melena.  Genitourinary: Negative for dysuria, urgency, frequency and hematuria.  Musculoskeletal: Negative for myalgias, back pain, joint pain and neck pain.  Skin: Negative for rash.  Neurological: Positive for seizures and speech difficulty. Negative for dizziness, tingling, tremors, sensory change, focal weakness and headaches.  Endo/Heme/Allergies: Negative for environmental allergies, cold intolerance, heat intolerance and polydipsia. Does not bruise/bleed easily.  Psychiatric/Behavioral:  Negative for depression and suicidal ideas. The patient is not nervous/anxious and does not have insomnia.      Objective  Filed Vitals:   11/07/15 0805  BP: 120/88  Pulse: 72  Height: 5\' 7"  (1.702 m)  Weight: 228 lb (103.42 kg)    Physical Exam  Constitutional: She is well-developed, well-nourished, and in no distress. No distress.  HENT:  Head: Normocephalic and atraumatic.  Right Ear: External ear normal.  Left Ear: External ear normal.  Nose: Nose normal.  Mouth/Throat: Oropharynx is clear and moist.  Eyes: Conjunctivae and EOM are normal. Pupils are equal, round, and reactive to light. Right eye exhibits no discharge. Left eye exhibits no discharge.  Neck: Normal range of motion. Neck supple. No JVD present. No thyromegaly present.  Cardiovascular: Normal rate, regular rhythm, normal heart sounds and intact distal pulses.  Exam reveals no gallop and no friction rub.   No murmur heard. Pulmonary/Chest: Effort normal and breath sounds normal. She has no wheezes. She has no rales.  Abdominal: Soft. Bowel sounds are normal. She exhibits no mass. There is no hepatosplenomegaly. There is no tenderness. There is no guarding.  Musculoskeletal: Normal range of motion. She exhibits no edema.  Lymphadenopathy:    She has no cervical adenopathy.  Neurological: She is alert.  Skin: Skin is warm and dry. She is not diaphoretic.  Psychiatric: Mood and affect normal.  Nursing note and vitals reviewed.     Assessment & Plan  Problem List Items Addressed This Visit      Endocrine   Acquired hypothyroidism - Primary   Relevant Medications   levothyroxine (SYNTHROID, LEVOTHROID) 150 MCG tablet   levothyroxine (SYNTHROID, LEVOTHROID) 175 MCG tablet     Nervous and Auditory   Absence seizure disorder (HCC)   Relevant Medications   divalproex (DEPAKOTE ER) 500 MG 24 hr tablet   divalproex (DEPAKOTE) 125 MG DR tablet     Other   Taking medication for chronic disease   Relevant  Orders   Renal Function Panel   Hepatic Function Panel (6)   CBC    Other Visit Diagnoses    Absence epileptic syndrome, not intractable, without status epilepticus (HCC)        Relevant Medications    divalproex (DEPAKOTE ER) 500 MG 24 hr tablet    divalproex (DEPAKOTE) 125 MG DR tablet    Other Relevant Orders    Valproic Acid level    Renal Function Panel    Hepatic Function Panel (6)    CBC    Breast cancer screening        Relevant Orders    MM Digital Screening         Dr. Otilio Miu [facility]  11/07/2015

## 2015-11-08 LAB — CBC
Hematocrit: 43.7 % (ref 34.0–46.6)
Hemoglobin: 14.4 g/dL (ref 11.1–15.9)
MCH: 29.1 pg (ref 26.6–33.0)
MCHC: 33 g/dL (ref 31.5–35.7)
MCV: 88 fL (ref 79–97)
PLATELETS: 404 10*3/uL — AB (ref 150–379)
RBC: 4.95 x10E6/uL (ref 3.77–5.28)
RDW: 14.5 % (ref 12.3–15.4)
WBC: 7.4 10*3/uL (ref 3.4–10.8)

## 2015-11-08 LAB — HEPATIC FUNCTION PANEL (6)
ALK PHOS: 85 IU/L (ref 39–117)
ALT: 21 IU/L (ref 0–32)
AST: 16 IU/L (ref 0–40)
Bilirubin Total: 0.3 mg/dL (ref 0.0–1.2)
Bilirubin, Direct: 0.08 mg/dL (ref 0.00–0.40)

## 2015-11-08 LAB — RENAL FUNCTION PANEL
ALBUMIN: 4.1 g/dL (ref 3.5–5.5)
BUN/Creatinine Ratio: 18 (ref 9–23)
BUN: 12 mg/dL (ref 6–24)
CHLORIDE: 98 mmol/L (ref 96–106)
CO2: 23 mmol/L (ref 18–29)
CREATININE: 0.65 mg/dL (ref 0.57–1.00)
Calcium: 9.1 mg/dL (ref 8.7–10.2)
GFR, EST AFRICAN AMERICAN: 117 mL/min/{1.73_m2} (ref >59)
GFR, EST NON AFRICAN AMERICAN: 102 mL/min/{1.73_m2} (ref >59)
Glucose: 65 mg/dL (ref 65–99)
Phosphorus: 3.4 mg/dL (ref 2.5–4.5)
Potassium: 5.4 mmol/L — ABNORMAL HIGH (ref 3.5–5.2)
Sodium: 141 mmol/L (ref 134–144)

## 2015-11-08 LAB — VALPROIC ACID LEVEL: VALPROIC ACID LVL: 47 ug/mL — AB (ref 50–100)

## 2015-12-07 ENCOUNTER — Ambulatory Visit: Payer: Self-pay | Admitting: Family Medicine

## 2016-05-08 ENCOUNTER — Encounter: Payer: Self-pay | Admitting: Family Medicine

## 2016-05-08 ENCOUNTER — Ambulatory Visit (INDEPENDENT_AMBULATORY_CARE_PROVIDER_SITE_OTHER): Payer: 59 | Admitting: Family Medicine

## 2016-05-08 VITALS — BP 100/60 | HR 70 | Ht 67.0 in | Wt 216.0 lb

## 2016-05-08 DIAGNOSIS — E039 Hypothyroidism, unspecified: Secondary | ICD-10-CM

## 2016-05-08 DIAGNOSIS — Z23 Encounter for immunization: Secondary | ICD-10-CM

## 2016-05-08 DIAGNOSIS — G40A09 Absence epileptic syndrome, not intractable, without status epilepticus: Secondary | ICD-10-CM

## 2016-05-08 MED ORDER — LEVOTHYROXINE SODIUM 150 MCG PO TABS: 150.0000 ug | ORAL_TABLET | ORAL | 5 refills | Status: DC

## 2016-05-08 MED ORDER — LEVOTHYROXINE SODIUM 175 MCG PO TABS: 175.0000 ug | ORAL_TABLET | ORAL | 5 refills | Status: DC

## 2016-05-08 MED ORDER — DIVALPROEX SODIUM ER 500 MG PO TB24: 500.0000 mg | ORAL_TABLET | Freq: Every day | ORAL | 5 refills | Status: DC

## 2016-05-08 MED ORDER — DIVALPROEX SODIUM 125 MG PO DR TAB: 125.0000 mg | DELAYED_RELEASE_TABLET | Freq: Every day | ORAL | 5 refills | Status: DC

## 2016-05-08 NOTE — Progress Notes (Signed)
Name: Karen Franco   MRN: [id number]    DOB: 10/20/1960   Date:05/08/2016       Progress Note  Subjective  Chief Complaint  Chief Complaint  Patient presents with  . Hypothyroidism  . Seizures    Seizures   This is a chronic problem. The problem has been resolved (controlled on medications). Pertinent negatives include no sleepiness, no confusion, no headaches, no speech difficulty, no visual disturbance, no neck stiffness, no sore throat, no chest pain, no cough, no nausea, no vomiting, no diarrhea and no muscle weakness. There has been no fever.  Thyroid Problem  Presents for follow-up visit. Patient reports no anxiety, cold intolerance, constipation, depressed mood, diaphoresis, diarrhea, dry skin, fatigue, hair loss, heat intolerance, hoarse voice, leg swelling, menstrual problem, nail problem, palpitations, tremors, visual change, weight gain or weight loss. The symptoms have been stable.    No problem-specific Assessment & Plan notes found for this encounter.   Past Medical History:  Diagnosis Date  . Dysrhythmia    palpatations if thyroid meds are "off"  . Hypothyroidism   . Motion sickness    cars - back seat  . Seizures ([place])    22 yrs ago - controlled on Depakote  . Thyroid disease     Past Surgical History:  Procedure Laterality Date  . APPENDECTOMY    . BREAST CYST ASPIRATION     not sure which side-neg  . CESAREAN SECTION     x 2  . COLONOSCOPY WITH PROPOFOL N/A 01/20/2015   Procedure: COLONOSCOPY WITH PROPOFOL;  Surgeon: Lucilla Lame, MD;  Location: [place];  Service: Endoscopy;  Laterality: N/A;  . MOHS SURGERY  05/2015   L) ear  . POLYPECTOMY  01/20/2015   Procedure: POLYPECTOMY;  Surgeon: Lucilla Lame, MD;  Location: [place];  Service: Endoscopy;;  . THYROIDECTOMY    . VAGINAL HYSTERECTOMY      Family History  Problem Relation Age of Onset  . Cancer Mother   . Heart disease Father   . Diabetes Maternal Grandfather   .  Breast cancer Paternal Aunt     Social History   Social History  . Marital status: Married    Spouse name: N/A  . Number of children: N/A  . Years of education: N/A   Occupational History  . Not on file.   Social History Main Topics  . Smoking status: Never Smoker  . Smokeless tobacco: Not on file  . Alcohol use 0.0 oz/week     Comment: 1 glass wine/month  . Drug use: No  . Sexual activity: Yes   Other Topics Concern  . Not on file   Social History Narrative  . No narrative on file    No Known Allergies   Review of Systems  Constitutional: Negative for chills, diaphoresis, fatigue, fever, malaise/fatigue, weight gain and weight loss.  HENT: Negative for ear discharge, ear pain, hoarse voice and sore throat.   Eyes: Negative for blurred vision and visual disturbance.  Respiratory: Negative for cough, sputum production, shortness of breath and wheezing.   Cardiovascular: Negative for chest pain, palpitations and leg swelling.  Gastrointestinal: Negative for abdominal pain, blood in stool, constipation, diarrhea, heartburn, melena, nausea and vomiting.  Genitourinary: Negative for dysuria, frequency, hematuria, menstrual problem and urgency.  Musculoskeletal: Negative for back pain, joint pain, myalgias and neck pain.  Skin: Negative for rash.  Neurological: Positive for seizures. Negative for dizziness, tingling, tremors, sensory change, focal weakness, speech difficulty and  headaches.  Endo/Heme/Allergies: Negative for environmental allergies, cold intolerance, heat intolerance and polydipsia. Does not bruise/bleed easily.  Psychiatric/Behavioral: Negative for confusion, depression and suicidal ideas. The patient is not nervous/anxious and does not have insomnia.      Objective  Vitals:   05/08/16 0803  BP: 100/60  Pulse: 70  Weight: 216 lb (98 kg)  Height: 5\' 7"  (1.702 m)    Physical Exam  Constitutional: She is well-developed, well-nourished, and in no  distress. No distress.  HENT:  Head: Normocephalic and atraumatic.  Right Ear: External ear normal.  Left Ear: External ear normal.  Nose: Nose normal.  Mouth/Throat: Oropharynx is clear and moist.  Eyes: Conjunctivae and EOM are normal. Pupils are equal, round, and reactive to light. Right eye exhibits no discharge. Left eye exhibits no discharge.  Neck: Normal range of motion. Neck supple. No JVD present. No thyromegaly present.  Cardiovascular: Normal rate, regular rhythm, normal heart sounds and intact distal pulses.  Exam reveals no gallop and no friction rub.   No murmur heard. Pulmonary/Chest: Effort normal and breath sounds normal. She has no wheezes. She has no rales.  Abdominal: Soft. Bowel sounds are normal. She exhibits no mass. There is no tenderness. There is no guarding.  Musculoskeletal: Normal range of motion. She exhibits no edema, tenderness or deformity.  Lymphadenopathy:    She has no cervical adenopathy.  Neurological: She is alert. She has normal reflexes.  Skin: Skin is warm and dry. She is not diaphoretic.  Psychiatric: Mood and affect normal.  Nursing note and vitals reviewed.     Assessment & Plan  Problem List Items Addressed This Visit      Endocrine   Acquired hypothyroidism - Primary   Relevant Medications   levothyroxine (SYNTHROID, LEVOTHROID) 150 MCG tablet   levothyroxine (SYNTHROID, LEVOTHROID) 175 MCG tablet (Start on 05/09/2016)   Other Relevant Orders   Flu Vaccine QUAD 36+ mos PF IM (Fluarix & Fluzone Quad PF) (Completed)   TSH     Nervous and Auditory   Absence seizure disorder (HCC)   Relevant Medications   divalproex (DEPAKOTE) 125 MG DR tablet   divalproex (DEPAKOTE ER) 500 MG 24 hr tablet    Other Visit Diagnoses    Absence epileptic syndrome, not intractable, without status epilepticus (HCC)       Relevant Medications   divalproex (DEPAKOTE) 125 MG DR tablet   divalproex (DEPAKOTE ER) 500 MG 24 hr tablet   Immunization due        Relevant Orders   Tdap vaccine greater than or equal to 7yo IM (Completed)        Dr. [facility]  05/08/16

## 2016-05-09 LAB — TSH: TSH: 3.97 u[IU]/mL (ref 0.450–4.500)

## 2016-10-30 ENCOUNTER — Encounter: Payer: Self-pay | Admitting: Family Medicine

## 2016-10-30 ENCOUNTER — Ambulatory Visit (INDEPENDENT_AMBULATORY_CARE_PROVIDER_SITE_OTHER): Payer: 59 | Admitting: Family Medicine

## 2016-10-30 VITALS — BP 120/64 | HR 76 | Ht 67.0 in | Wt 201.0 lb

## 2016-10-30 DIAGNOSIS — E039 Hypothyroidism, unspecified: Secondary | ICD-10-CM | POA: Diagnosis not present

## 2016-10-30 DIAGNOSIS — G40A09 Absence epileptic syndrome, not intractable, without status epilepticus: Secondary | ICD-10-CM | POA: Diagnosis not present

## 2016-10-30 DIAGNOSIS — Z1159 Encounter for screening for other viral diseases: Secondary | ICD-10-CM

## 2016-10-30 DIAGNOSIS — Z114 Encounter for screening for human immunodeficiency virus [HIV]: Secondary | ICD-10-CM

## 2016-10-30 MED ORDER — DIVALPROEX SODIUM 125 MG PO DR TAB: 125.0000 mg | DELAYED_RELEASE_TABLET | Freq: Every day | ORAL | 11 refills | Status: DC

## 2016-10-30 MED ORDER — DIVALPROEX SODIUM ER 500 MG PO TB24: 500.0000 mg | ORAL_TABLET | Freq: Every day | ORAL | 11 refills | Status: DC

## 2016-10-30 MED ORDER — LEVOTHYROXINE SODIUM 175 MCG PO TABS: 175.0000 ug | ORAL_TABLET | ORAL | 11 refills | Status: DC

## 2016-10-30 MED ORDER — LEVOTHYROXINE SODIUM 150 MCG PO TABS: 150.0000 ug | ORAL_TABLET | ORAL | 11 refills | Status: DC

## 2016-10-30 NOTE — Progress Notes (Signed)
Name: Karen Franco   MRN: [id number]    DOB: 13-May-1961   Date:10/30/2016       Progress Note  Subjective  Chief Complaint  Chief Complaint  Patient presents with  . Hypothyroidism  . Seizures    Seizures   This is a chronic (last seizure > 91yrs) problem. The problem has not changed since onset.Associated symptoms include nausea. Pertinent negatives include no sleepiness, no confusion, no headaches, no speech difficulty, no visual disturbance, no neck stiffness, no sore throat, no chest pain, no cough, no vomiting, no diarrhea and no muscle weakness. nausea There was the sensation of an aura present.  Thyroid Problem  Presents for follow-up visit. Symptoms include cold intolerance and weight loss. Patient reports no anxiety, constipation, depressed mood, diaphoresis, diarrhea, dry skin, fatigue, hair loss, heat intolerance, hoarse voice, nail problem, palpitations or weight gain. (Loss by design) The symptoms have been stable.    No problem-specific Assessment & Plan notes found for this encounter.   Past Medical History:  Diagnosis Date  . Dysrhythmia    palpatations if thyroid meds are "off"  . Hypothyroidism   . Motion sickness    cars - back seat  . Seizures ([place])    22 yrs ago - controlled on Depakote  . Thyroid disease     Past Surgical History:  Procedure Laterality Date  . APPENDECTOMY    . BREAST CYST ASPIRATION     not sure which side-neg  . CESAREAN SECTION     x 2  . COLONOSCOPY WITH PROPOFOL N/A 01/20/2015   Procedure: COLONOSCOPY WITH PROPOFOL;  Surgeon: Lucilla Lame, MD;  Location: [facility];  Service: Endoscopy;  Laterality: N/A;  . MOHS SURGERY  05/2015   L) ear  . POLYPECTOMY  01/20/2015   Procedure: POLYPECTOMY;  Surgeon: Lucilla Lame, MD;  Location: [facility];  Service: Endoscopy;;  . THYROIDECTOMY    . VAGINAL HYSTERECTOMY      Family History  Problem Relation Age of Onset  . Cancer Mother   . Heart disease Father   .  Diabetes Maternal Grandfather   . Breast cancer Paternal Aunt     Social History   Social History  . Marital status: Married    Spouse name: N/A  . Number of children: N/A  . Years of education: N/A   Occupational History  . Not on file.   Social History Main Topics  . Smoking status: Never Smoker  . Smokeless tobacco: Never Used  . Alcohol use 0.0 oz/week     Comment: 1 glass wine/month  . Drug use: No  . Sexual activity: Yes   Other Topics Concern  . Not on file   Social History Narrative  . No narrative on file    No Known Allergies  Outpatient Medications Prior to Visit  Medication Sig Dispense Refill  . Calcium Carbonate-Vitamin D (CALCIUM-VITAMIN D) 500-200 MG-UNIT per tablet Take 1 tablet by mouth daily.    . cholecalciferol (VITAMIN D) 1000 units tablet Take 1,000 Units by mouth daily.    . Multiple Vitamin (MULTIVITAMIN) capsule Take 1 capsule by mouth daily.    . vitamin B-12 (CYANOCOBALAMIN) 1000 MCG tablet Take 1,000 mcg by mouth daily.    . divalproex (DEPAKOTE ER) 500 MG 24 hr tablet Take 1 tablet (500 mg total) by mouth daily. 30 tablet 5  . divalproex (DEPAKOTE) 125 MG DR tablet Take 1 tablet (125 mg total) by mouth daily. 30 tablet 5  .  levothyroxine (SYNTHROID, LEVOTHROID) 150 MCG tablet Take 1 tablet (150 mcg total) by mouth 3 (three) times a week. 22 tablet 5  . levothyroxine (SYNTHROID, LEVOTHROID) 175 MCG tablet Take 1 tablet (175 mcg total) by mouth 4 (four) times a week. 30 tablet 5   No facility-administered medications prior to visit.     Review of Systems  Constitutional: Positive for weight loss. Negative for chills, diaphoresis, fatigue, fever, malaise/fatigue and weight gain.  HENT: Negative for ear discharge, ear pain, hoarse voice and sore throat.   Eyes: Negative for blurred vision and visual disturbance.  Respiratory: Negative for cough, sputum production, shortness of breath and wheezing.   Cardiovascular: Negative for chest pain,  palpitations and leg swelling.  Gastrointestinal: Positive for nausea. Negative for abdominal pain, blood in stool, constipation, diarrhea, heartburn, melena and vomiting.  Genitourinary: Negative for dysuria, frequency, hematuria and urgency.  Musculoskeletal: Negative for back pain, joint pain, myalgias and neck pain.  Skin: Negative for rash.  Neurological: Positive for seizures. Negative for dizziness, tingling, sensory change, focal weakness, speech difficulty and headaches.  Endo/Heme/Allergies: Positive for cold intolerance. Negative for environmental allergies, heat intolerance and polydipsia. Does not bruise/bleed easily.  Psychiatric/Behavioral: Negative for confusion, depression and suicidal ideas. The patient is not nervous/anxious and does not have insomnia.      Objective  Vitals:   10/30/16 0817  BP: 120/64  Pulse: 76  Weight: 201 lb (91.2 kg)  Height: 5\' 7"  (1.702 m)    Physical Exam  Constitutional: She is well-developed, well-nourished, and in no distress. No distress.  HENT:  Head: Normocephalic and atraumatic.  Right Ear: External ear normal.  Left Ear: External ear normal.  Nose: Nose normal.  Mouth/Throat: Oropharynx is clear and moist.  Eyes: Conjunctivae and EOM are normal. Pupils are equal, round, and reactive to light. Right eye exhibits no discharge. Left eye exhibits no discharge.  Neck: Normal range of motion. Neck supple. No JVD present. No thyromegaly present.  Cardiovascular: Normal rate, regular rhythm, normal heart sounds and intact distal pulses.  Exam reveals no gallop and no friction rub.   No murmur heard. Pulmonary/Chest: Effort normal and breath sounds normal.  Abdominal: Soft. Bowel sounds are normal. She exhibits no mass. There is no tenderness. There is no guarding.  Musculoskeletal: Normal range of motion. She exhibits no edema.  Lymphadenopathy:    She has no cervical adenopathy.  Neurological: She is alert. She has normal reflexes.   Skin: Skin is warm and dry. She is not diaphoretic.  Psychiatric: Mood and affect normal.  Nursing note and vitals reviewed.     Assessment & Plan  Problem List Items Addressed This Visit      Endocrine   Acquired hypothyroidism - Primary   Relevant Medications   levothyroxine (SYNTHROID, LEVOTHROID) 175 MCG tablet (Start on 10/31/2016)   levothyroxine (SYNTHROID, LEVOTHROID) 150 MCG tablet   Other Relevant Orders   Lipid Profile   Renal Function Panel   TSH     Nervous and Auditory   Absence seizure disorder (HCC)   Relevant Medications   divalproex (DEPAKOTE) 125 MG DR tablet   divalproex (DEPAKOTE ER) 500 MG 24 hr tablet    Other Visit Diagnoses    Absence epileptic syndrome, not intractable, without status epilepticus (HCC)       Relevant Medications   divalproex (DEPAKOTE) 125 MG DR tablet   divalproex (DEPAKOTE ER) 500 MG 24 hr tablet   Other Relevant Orders   Renal Function Panel  Need for hepatitis C screening test       Relevant Orders   Hepatitis C antibody   Encounter for screening for HIV       Relevant Orders   HIV antibody      Meds ordered this encounter  Medications  . divalproex (DEPAKOTE) 125 MG DR tablet    Sig: Take 1 tablet (125 mg total) by mouth daily.    Dispense:  30 tablet    Refill:  11  . divalproex (DEPAKOTE ER) 500 MG 24 hr tablet    Sig: Take 1 tablet (500 mg total) by mouth daily.    Dispense:  30 tablet    Refill:  11  . levothyroxine (SYNTHROID, LEVOTHROID) 175 MCG tablet    Sig: Take 1 tablet (175 mcg total) by mouth 4 (four) times a week.    Dispense:  30 tablet    Refill:  11  . levothyroxine (SYNTHROID, LEVOTHROID) 150 MCG tablet    Sig: Take 1 tablet (150 mcg total) by mouth 3 (three) times a week.    Dispense:  22 tablet    Refill:  11      Dr. Quinisha Mould [facility]  10/30/16

## 2016-10-31 LAB — RENAL FUNCTION PANEL
ALBUMIN: 4.2 g/dL (ref 3.5–5.5)
BUN/Creatinine Ratio: 28 — ABNORMAL HIGH (ref 9–23)
BUN: 19 mg/dL (ref 6–24)
CALCIUM: 8.9 mg/dL (ref 8.7–10.2)
CO2: 26 mmol/L (ref 20–29)
CREATININE: 0.69 mg/dL (ref 0.57–1.00)
Chloride: 100 mmol/L (ref 96–106)
GFR calc Af Amer: 114 mL/min/{1.73_m2} (ref >59)
GFR calc non Af Amer: 99 mL/min/{1.73_m2} (ref >59)
Glucose: 65 mg/dL (ref 65–99)
PHOSPHORUS: 3.6 mg/dL (ref 2.5–4.5)
Potassium: 5.4 mmol/L — ABNORMAL HIGH (ref 3.5–5.2)
Sodium: 142 mmol/L (ref 134–144)

## 2016-10-31 LAB — TSH: TSH: 4.46 u[IU]/mL (ref 0.450–4.500)

## 2016-10-31 LAB — LIPID PANEL
CHOL/HDL RATIO: 3.4 ratio (ref 0.0–4.4)
CHOLESTEROL TOTAL: 270 mg/dL — AB (ref 100–199)
HDL: 79 mg/dL (ref >39)
LDL CALC: 177 mg/dL — AB (ref 0–99)
TRIGLYCERIDES: 72 mg/dL (ref 0–149)
VLDL Cholesterol Cal: 14 mg/dL (ref 5–40)

## 2016-10-31 LAB — HEPATITIS C ANTIBODY: Hep C Virus Ab: <0.1 s/co ratio (ref 0.0–0.9)

## 2016-10-31 LAB — HIV ANTIBODY (ROUTINE TESTING W REFLEX): HIV Screen 4th Generation wRfx: NONREACTIVE

## 2016-11-06 ENCOUNTER — Ambulatory Visit: Payer: Self-pay | Admitting: Family Medicine

## 2017-02-27 ENCOUNTER — Ambulatory Visit (INDEPENDENT_AMBULATORY_CARE_PROVIDER_SITE_OTHER): Payer: 59

## 2017-02-27 DIAGNOSIS — Z23 Encounter for immunization: Secondary | ICD-10-CM

## 2017-03-18 ENCOUNTER — Other Ambulatory Visit: Payer: Self-pay

## 2017-03-19 ENCOUNTER — Ambulatory Visit (INDEPENDENT_AMBULATORY_CARE_PROVIDER_SITE_OTHER): Payer: 59 | Admitting: Family Medicine

## 2017-03-19 ENCOUNTER — Encounter: Payer: Self-pay | Admitting: Family Medicine

## 2017-03-19 VITALS — BP 120/80 | HR 80 | Ht 67.0 in | Wt 212.0 lb

## 2017-03-19 DIAGNOSIS — F411 Generalized anxiety disorder: Secondary | ICD-10-CM | POA: Diagnosis not present

## 2017-03-19 MED ORDER — CLONAZEPAM 0.5 MG PO TABS: 0.5000 mg | ORAL_TABLET | Freq: Two times a day (BID) | ORAL | 1 refills | Status: DC | PRN

## 2017-03-19 NOTE — Progress Notes (Signed)
Name: Karen Franco   MRN: [id number]    DOB: Nov 19, 1960   Date:03/19/2017       Progress Note  Subjective  Chief Complaint  Chief Complaint  Patient presents with  . Anxiety    daughter getting married- needs something for anxiety    Anxiety  Presents for initial visit. Onset was 1 to 4 weeks ago. The problem has been gradually worsening. Symptoms include excessive worry, irritability, nervous/anxious behavior and palpitations. Patient reports no chest pain, depressed mood, dizziness, insomnia, muscle tension, nausea, shortness of breath or suicidal ideas. Symptoms occur occasionally. The severity of symptoms is moderate. The symptoms are aggravated by family issues (wedding stress). The quality of sleep is good.   Her past medical history is significant for anxiety/panic attacks. (Seizure disorder) Past treatments include nothing.    No problem-specific Assessment & Plan notes found for this encounter.   Past Medical History:  Diagnosis Date  . Dysrhythmia    palpatations if thyroid meds are "off"  . Hypothyroidism   . Motion sickness    cars - back seat  . Seizures ([place])    22 yrs ago - controlled on Depakote  . Thyroid disease     Past Surgical History:  Procedure Laterality Date  . APPENDECTOMY    . BREAST CYST ASPIRATION     not sure which side-neg  . CESAREAN SECTION     x 2  . MOHS SURGERY  05/2015   L) ear  . THYROIDECTOMY    . VAGINAL HYSTERECTOMY      Family History  Problem Relation Age of Onset  . Cancer Mother   . Heart disease Father   . Diabetes Maternal Grandfather   . Breast cancer Paternal Aunt     Social History   Socioeconomic History  . Marital status: Married    Spouse name: Not on file  . Number of children: Not on file  . Years of education: Not on file  . Highest education level: Not on file  Social Needs  . Financial resource strain: Not on file  . Food insecurity - worry: Not on file  . Food insecurity - inability: Not  on file  . Transportation needs - medical: Not on file  . Transportation needs - non-medical: Not on file  Occupational History  . Not on file  Tobacco Use  . Smoking status: Never Smoker  . Smokeless tobacco: Never Used  Substance and Sexual Activity  . Alcohol use: Yes    Alcohol/week: 0.0 oz    Comment: 1 glass wine/month  . Drug use: No  . Sexual activity: Yes  Other Topics Concern  . Not on file  Social History Narrative  . Not on file    No Known Allergies  Outpatient Medications Prior to Visit  Medication Sig Dispense Refill  . Calcium Carbonate-Vitamin D (CALCIUM-VITAMIN D) 500-200 MG-UNIT per tablet Take 1 tablet by mouth daily.    . cholecalciferol (VITAMIN D) 1000 units tablet Take 1,000 Units by mouth daily.    . divalproex (DEPAKOTE ER) 500 MG 24 hr tablet Take 1 tablet (500 mg total) by mouth daily. 30 tablet 11  . divalproex (DEPAKOTE) 125 MG DR tablet Take 1 tablet (125 mg total) by mouth daily. 30 tablet 11  . levothyroxine (SYNTHROID, LEVOTHROID) 150 MCG tablet Take 1 tablet (150 mcg total) by mouth 3 (three) times a week. 22 tablet 11  . levothyroxine (SYNTHROID, LEVOTHROID) 175 MCG tablet Take 1 tablet (175 mcg total)  by mouth 4 (four) times a week. 30 tablet 11  . Multiple Vitamin (MULTIVITAMIN) capsule Take 1 capsule by mouth daily.    . vitamin B-12 (CYANOCOBALAMIN) 1000 MCG tablet Take 1,000 mcg by mouth daily.     No facility-administered medications prior to visit.     Review of Systems  Constitutional: Positive for irritability. Negative for chills, fever, malaise/fatigue and weight loss.  HENT: Negative for ear discharge, ear pain and sore throat.   Eyes: Negative for blurred vision.  Respiratory: Negative for cough, sputum production, shortness of breath and wheezing.   Cardiovascular: Positive for palpitations. Negative for chest pain and leg swelling.  Gastrointestinal: Negative for abdominal pain, blood in stool, constipation, diarrhea,  heartburn, melena and nausea.  Genitourinary: Negative for dysuria, frequency, hematuria and urgency.  Musculoskeletal: Negative for back pain, joint pain, myalgias and neck pain.  Skin: Negative for rash.  Neurological: Negative for dizziness, tingling, sensory change, focal weakness and headaches.  Endo/Heme/Allergies: Negative for environmental allergies and polydipsia. Does not bruise/bleed easily.  Psychiatric/Behavioral: Negative for depression and suicidal ideas. The patient is nervous/anxious. The patient does not have insomnia.      Objective  Vitals:   03/19/17 0834  BP: 120/80  Pulse: 80  Weight: 212 lb (96.2 kg)  Height: 5\' 7"  (1.702 m)    Physical Exam  Constitutional: She is well-developed, well-nourished, and in no distress. No distress.  HENT:  Head: Normocephalic and atraumatic.  Right Ear: External ear normal.  Left Ear: External ear normal.  Nose: Nose normal.  Mouth/Throat: Oropharynx is clear and moist.  Eyes: Conjunctivae and EOM are normal. Pupils are equal, round, and reactive to light. Right eye exhibits no discharge. Left eye exhibits no discharge.  Neck: Normal range of motion. Neck supple. No JVD present. No thyromegaly present.  Cardiovascular: Normal rate, regular rhythm, normal heart sounds and intact distal pulses. Exam reveals no gallop and no friction rub.  No murmur heard. Pulmonary/Chest: Effort normal and breath sounds normal. She has no wheezes. She has no rales.  Abdominal: Soft. Bowel sounds are normal. She exhibits no mass. There is no tenderness. There is no guarding.  Musculoskeletal: Normal range of motion. She exhibits no edema.  Lymphadenopathy:    She has no cervical adenopathy.  Neurological: She is alert.  Skin: Skin is warm and dry. She is not diaphoretic.  Psychiatric: Mood and affect normal.  Nursing note and vitals reviewed.     Assessment & Plan  Problem List Items Addressed This Visit    None    Visit Diagnoses     Generalized anxiety disorder    -  Primary   Relevant Medications   clonazePAM (KLONOPIN) 0.5 MG tablet      Meds ordered this encounter  Medications  . clonazePAM (KLONOPIN) 0.5 MG tablet    Sig: Take 1 tablet (0.5 mg total) 2 (two) times daily as needed by mouth for anxiety.    Dispense:  20 tablet    Refill:  1      Dr. Deanna Jones [facility]  03/19/17

## 2017-10-02 ENCOUNTER — Ambulatory Visit: Payer: Self-pay | Admitting: Family Medicine

## 2017-10-03 ENCOUNTER — Ambulatory Visit (INDEPENDENT_AMBULATORY_CARE_PROVIDER_SITE_OTHER): Payer: 59 | Admitting: Family Medicine

## 2017-10-03 ENCOUNTER — Encounter: Payer: Self-pay | Admitting: Family Medicine

## 2017-10-03 VITALS — BP 112/78 | HR 68 | Ht 67.0 in | Wt 214.0 lb

## 2017-10-03 DIAGNOSIS — F411 Generalized anxiety disorder: Secondary | ICD-10-CM | POA: Diagnosis not present

## 2017-10-03 DIAGNOSIS — E039 Hypothyroidism, unspecified: Secondary | ICD-10-CM

## 2017-10-03 DIAGNOSIS — R69 Illness, unspecified: Secondary | ICD-10-CM | POA: Diagnosis not present

## 2017-10-03 DIAGNOSIS — L723 Sebaceous cyst: Secondary | ICD-10-CM | POA: Diagnosis not present

## 2017-10-03 DIAGNOSIS — G40A09 Absence epileptic syndrome, not intractable, without status epilepticus: Secondary | ICD-10-CM | POA: Diagnosis not present

## 2017-10-03 MED ORDER — LEVOTHYROXINE SODIUM 150 MCG PO TABS: 150.0000 ug | ORAL_TABLET | ORAL | 11 refills | Status: DC

## 2017-10-03 MED ORDER — LEVOTHYROXINE SODIUM 175 MCG PO TABS: 175.0000 ug | ORAL_TABLET | ORAL | 11 refills | Status: DC

## 2017-10-03 MED ORDER — CLONAZEPAM 0.5 MG PO TABS: 0.5000 mg | ORAL_TABLET | Freq: Every day | ORAL | 0 refills | Status: DC

## 2017-10-03 MED ORDER — DIVALPROEX SODIUM 125 MG PO DR TAB: 125.0000 mg | DELAYED_RELEASE_TABLET | Freq: Every day | ORAL | 11 refills | Status: DC

## 2017-10-03 MED ORDER — DIVALPROEX SODIUM ER 500 MG PO TB24: 500.0000 mg | ORAL_TABLET | Freq: Every day | ORAL | 11 refills | Status: DC

## 2017-10-03 NOTE — Assessment & Plan Note (Signed)
Possible breakthough over past couple of weeks /neurology consult

## 2017-10-03 NOTE — Assessment & Plan Note (Signed)
Stable on split dosage levothyroxin

## 2017-10-03 NOTE — Progress Notes (Signed)
Name: Karen Franco   MRN: [id number]    DOB: 28-Dec-1960   Date:10/03/2017       Progress Note  Subjective  Chief Complaint  Chief Complaint  Patient presents with  . Hypothyroidism  . Seizures    Concern about a bump with swelling/tenderness of right axillary  Seizures   This is a chronic problem. Episode onset: since 38 years/near episodes over past 4-5 weeks. The problem has been gradually worsening. The most recent episode lasted less than 30 seconds. Associated symptoms include confusion, speech difficulty, chest pain and nausea. Pertinent negatives include no headaches, no neck stiffness, no sore throat, no cough and no diarrhea. Characteristics do not include eye blinking, eye deviation, bowel incontinence, bladder incontinence, rhythmic jerking, loss of consciousness, bit tongue, apnea or cyanosis. There was the sensation of an aura present (thought and a taste). The seizure(s) had no focality. Possible causes do not include medication or dosage change, sleep deprivation, missed seizure meds, recent illness or change in alcohol use. There has been no fever.  Thyroid Problem  Presents for follow-up (hypothyroidism) visit. Symptoms include anxiety, cold intolerance and weight gain. Patient reports no constipation, depressed mood, diaphoresis, diarrhea, dry skin, fatigue, hair loss, heat intolerance, hoarse voice, leg swelling, menstrual problem, nail problem, palpitations, tremors, visual change or weight loss.  Anxiety  Presents for follow-up visit. Symptoms include chest pain, confusion, nausea, nervous/anxious behavior and panic. Patient reports no compulsions, depressed mood, dizziness, dry mouth, excessive worry, insomnia, irritability, malaise, palpitations, restlessness, shortness of breath or suicidal ideas. Symptoms occur occasionally. The severity of symptoms is mild. The quality of sleep is good.      Absence seizure disorder Possible breakthough over past couple of  weeks /neurology consult  Acquired hypothyroidism Stable on split dosage levothyroxin   Past Medical History:  Diagnosis Date  . Dysrhythmia    palpatations if thyroid meds are "off"  . Hypothyroidism   . Motion sickness    cars - back seat  . Seizures ([place])    22 yrs ago - controlled on Depakote  . Thyroid disease     Past Surgical History:  Procedure Laterality Date  . APPENDECTOMY    . BREAST CYST ASPIRATION     not sure which side-neg  . CESAREAN SECTION     x 2  . COLONOSCOPY WITH PROPOFOL N/A 01/20/2015   Procedure: COLONOSCOPY WITH PROPOFOL;  Surgeon: Lucilla Lame, MD;  Location: [facility];  Service: Endoscopy;  Laterality: N/A;  . MOHS SURGERY  05/2015   L) ear  . POLYPECTOMY  01/20/2015   Procedure: POLYPECTOMY;  Surgeon: Lucilla Lame, MD;  Location: [facility];  Service: Endoscopy;;  . THYROIDECTOMY    . VAGINAL HYSTERECTOMY      Family History  Problem Relation Age of Onset  . Cancer Mother   . Heart disease Father   . Diabetes Maternal Grandfather   . Breast cancer Paternal Aunt     Social History   Socioeconomic History  . Marital status: Married    Spouse name: Not on file  . Number of children: Not on file  . Years of education: Not on file  . Highest education level: Not on file  Occupational History  . Not on file  Social Needs  . Financial resource strain: Not on file  . Food insecurity:    Worry: Not on file    Inability: Not on file  . Transportation needs:    Medical: Not on file  Non-medical: Not on file  Tobacco Use  . Smoking status: Never Smoker  . Smokeless tobacco: Never Used  Substance and Sexual Activity  . Alcohol use: Yes    Alcohol/week: 0.0 oz    Comment: 1 glass wine/month  . Drug use: No  . Sexual activity: Yes  Lifestyle  . Physical activity:    Days per week: Not on file    Minutes per session: Not on file  . Stress: Not on file  Relationships  . Social connections:    Talks on phone: Not  on file    Gets together: Not on file    Attends religious service: Not on file    Active member of club or organization: Not on file    Attends meetings of clubs or organizations: Not on file    Relationship status: Not on file  . Intimate partner violence:    Fear of current or ex partner: Not on file    Emotionally abused: Not on file    Physically abused: Not on file    Forced sexual activity: Not on file  Other Topics Concern  . Not on file  Social History Narrative  . Not on file    No Known Allergies  Outpatient Medications Prior to Visit  Medication Sig Dispense Refill  . Calcium Carbonate-Vitamin D (CALCIUM-VITAMIN D) 500-200 MG-UNIT per tablet Take 1 tablet by mouth daily.    . cholecalciferol (VITAMIN D) 1000 units tablet Take 1,000 Units by mouth daily.    . Multiple Vitamin (MULTIVITAMIN) capsule Take 1 capsule by mouth daily.    . vitamin B-12 (CYANOCOBALAMIN) 1000 MCG tablet Take 1,000 mcg by mouth daily.    . clonazePAM (KLONOPIN) 0.5 MG tablet Take 1 tablet (0.5 mg total) 2 (two) times daily as needed by mouth for anxiety. (Patient taking differently: Take 0.5 mg by mouth daily. #30 per 6 months) 20 tablet 1  . divalproex (DEPAKOTE ER) 500 MG 24 hr tablet Take 1 tablet (500 mg total) by mouth daily. 30 tablet 11  . divalproex (DEPAKOTE) 125 MG DR tablet Take 1 tablet (125 mg total) by mouth daily. 30 tablet 11  . levothyroxine (SYNTHROID, LEVOTHROID) 150 MCG tablet Take 1 tablet (150 mcg total) by mouth 3 (three) times a week. 22 tablet 11  . levothyroxine (SYNTHROID, LEVOTHROID) 175 MCG tablet Take 1 tablet (175 mcg total) by mouth 4 (four) times a week. 30 tablet 11   No facility-administered medications prior to visit.     Review of Systems  Constitutional: Positive for malaise/fatigue and weight gain. Negative for chills, diaphoresis, fatigue, fever, irritability and weight loss.  HENT: Negative for ear discharge, ear pain, hoarse voice and sore throat.    Eyes: Negative for blurred vision.  Respiratory: Negative for apnea, cough, sputum production, shortness of breath and wheezing.   Cardiovascular: Positive for chest pain. Negative for palpitations, leg swelling and cyanosis.  Gastrointestinal: Positive for nausea. Negative for abdominal pain, blood in stool, bowel incontinence, constipation, diarrhea, heartburn and melena.  Genitourinary: Negative for bladder incontinence, dysuria, frequency, hematuria, menstrual problem and urgency.  Musculoskeletal: Negative for back pain, joint pain, myalgias and neck pain.  Skin: Negative for rash.  Neurological: Positive for seizures and speech difficulty. Negative for dizziness, tingling, tremors, sensory change, speech change, focal weakness, loss of consciousness, weakness and headaches.  Endo/Heme/Allergies: Positive for cold intolerance. Negative for environmental allergies, heat intolerance and polydipsia. Does not bruise/bleed easily.  Psychiatric/Behavioral: Positive for confusion. Negative for depression and  suicidal ideas. The patient is nervous/anxious. The patient does not have insomnia.      Objective  Vitals:   10/03/17 1342  BP: 112/78  Pulse: 68  Weight: 214 lb (97.1 kg)  Height: 5\' 7"  (1.702 m)    Physical Exam  Constitutional: She is oriented to person, place, and time. She appears well-developed and well-nourished.  HENT:  Head: Normocephalic.  Right Ear: External ear normal.  Left Ear: External ear normal.  Mouth/Throat: Oropharynx is clear and moist.  Eyes: Pupils are equal, round, and reactive to light. Conjunctivae and EOM are normal. Lids are everted and swept, no foreign bodies found. Left eye exhibits no hordeolum. No foreign body present in the left eye. Right conjunctiva is not injected. Left conjunctiva is not injected. No scleral icterus.  Neck: Normal range of motion. Neck supple. No JVD present. No tracheal deviation present. No thyromegaly present.   Cardiovascular: Normal rate, regular rhythm, normal heart sounds and intact distal pulses. Exam reveals no gallop and no friction rub.  No murmur heard. Pulmonary/Chest: Effort normal and breath sounds normal. No respiratory distress. She has no wheezes. She has no rales.  Abdominal: Soft. Bowel sounds are normal. She exhibits no mass. There is no hepatosplenomegaly. There is no tenderness. There is no rebound and no guarding.  Musculoskeletal: Normal range of motion. She exhibits no edema or tenderness.  Lymphadenopathy:    She has no cervical adenopathy.  Neurological: She is alert and oriented to person, place, and time. She has normal strength. She displays normal reflexes. No cranial nerve deficit or sensory deficit. She exhibits normal muscle tone. Coordination normal.  Skin: Skin is warm. No rash noted.  Residual cyst remnant right axillary/no tenderness nor erythema  Psychiatric: She has a normal mood and affect. Her behavior is normal. Thought content normal. Her mood appears not anxious. She does not exhibit a depressed mood.  Nursing note and vitals reviewed.     Assessment & Plan  Problem List Items Addressed This Visit      Endocrine   Acquired hypothyroidism    Stable on split dosage levothyroxin      Relevant Medications   levothyroxine (SYNTHROID, LEVOTHROID) 150 MCG tablet   levothyroxine (SYNTHROID, LEVOTHROID) 175 MCG tablet (Start on 10/04/2017)   Other Relevant Orders   TSH     Nervous and Auditory   Absence seizure disorder ([employer])    Possible breakthough over past couple of weeks /neurology consult      Relevant Medications   clonazePAM (KLONOPIN) 0.5 MG tablet   divalproex (DEPAKOTE ER) 500 MG 24 hr tablet   divalproex (DEPAKOTE) 125 MG DR tablet     Other   Taking medication for chronic disease   Relevant Orders   Valproic Acid level   Renal Function Panel   Hepatic function panel    Other Visit Diagnoses    Absence epileptic syndrome, not  intractable, without status epilepticus ([facility])    -  Primary   Relevant Medications   clonazePAM (KLONOPIN) 0.5 MG tablet   divalproex (DEPAKOTE ER) 500 MG 24 hr tablet   divalproex (DEPAKOTE) 125 MG DR tablet   Other Relevant Orders   Valproic Acid level   Ambulatory referral to Neurology   Generalized anxiety disorder       rare near panic episodes /clonazepam as needed   Relevant Medications   clonazePAM (KLONOPIN) 0.5 MG tablet   Sebaceous cyst       not infect /antibacterial soap /neosporin prn  Meds ordered this encounter  Medications  . clonazePAM (KLONOPIN) 0.5 MG tablet    Sig: Take 1 tablet (0.5 mg total) by mouth daily. #30 per 6 months    Dispense:  30 tablet    Refill:  0  . divalproex (DEPAKOTE ER) 500 MG 24 hr tablet    Sig: Take 1 tablet (500 mg total) by mouth daily.    Dispense:  30 tablet    Refill:  11  . divalproex (DEPAKOTE) 125 MG DR tablet    Sig: Take 1 tablet (125 mg total) by mouth daily.    Dispense:  30 tablet    Refill:  11  . levothyroxine (SYNTHROID, LEVOTHROID) 150 MCG tablet    Sig: Take 1 tablet (150 mcg total) by mouth 3 (three) times a week.    Dispense:  22 tablet    Refill:  11  . levothyroxine (SYNTHROID, LEVOTHROID) 175 MCG tablet    Sig: Take 1 tablet (175 mcg total) by mouth 4 (four) times a week.    Dispense:  30 tablet    Refill:  11      Dr. [facility]  10/03/17

## 2017-10-04 LAB — RENAL FUNCTION PANEL
Albumin: 4.1 g/dL (ref 3.5–5.5)
BUN / CREAT RATIO: 21 (ref 9–23)
BUN: 20 mg/dL (ref 6–24)
CHLORIDE: 100 mmol/L (ref 96–106)
CO2: 24 mmol/L (ref 20–29)
Calcium: 9.5 mg/dL (ref 8.7–10.2)
Creatinine, Ser: 0.94 mg/dL (ref 0.57–1.00)
GFR calc Af Amer: 80 mL/min/{1.73_m2} (ref >59)
GFR calc non Af Amer: 69 mL/min/{1.73_m2} (ref >59)
GLUCOSE: 73 mg/dL (ref 65–99)
PHOSPHORUS: 4 mg/dL (ref 2.5–4.5)
POTASSIUM: 4.7 mmol/L (ref 3.5–5.2)
SODIUM: 140 mmol/L (ref 134–144)

## 2017-10-04 LAB — HEPATIC FUNCTION PANEL
ALT: 18 IU/L (ref 0–32)
AST: 16 IU/L (ref 0–40)
Alkaline Phosphatase: 63 IU/L (ref 39–117)
Bilirubin Total: <0.2 mg/dL (ref 0.0–1.2)
Bilirubin, Direct: 0.07 mg/dL (ref 0.00–0.40)
TOTAL PROTEIN: 6.5 g/dL (ref 6.0–8.5)

## 2017-10-04 LAB — VALPROIC ACID LEVEL: Valproic Acid Lvl: 21 ug/mL — ABNORMAL LOW (ref 50–100)

## 2017-10-04 LAB — TSH: TSH: 9.73 u[IU]/mL — ABNORMAL HIGH (ref 0.450–4.500)

## 2017-10-07 ENCOUNTER — Encounter: Payer: Self-pay | Admitting: Family Medicine

## 2017-10-07 ENCOUNTER — Ambulatory Visit (INDEPENDENT_AMBULATORY_CARE_PROVIDER_SITE_OTHER): Payer: 59 | Admitting: Family Medicine

## 2017-10-07 VITALS — BP 120/80 | HR 64 | Ht 67.0 in | Wt 215.0 lb

## 2017-10-07 DIAGNOSIS — G40A09 Absence epileptic syndrome, not intractable, without status epilepticus: Secondary | ICD-10-CM | POA: Diagnosis not present

## 2017-10-07 DIAGNOSIS — Z1211 Encounter for screening for malignant neoplasm of colon: Secondary | ICD-10-CM

## 2017-10-07 DIAGNOSIS — Z1272 Encounter for screening for malignant neoplasm of vagina: Secondary | ICD-10-CM | POA: Diagnosis not present

## 2017-10-07 DIAGNOSIS — Z1231 Encounter for screening mammogram for malignant neoplasm of breast: Secondary | ICD-10-CM

## 2017-10-07 DIAGNOSIS — E039 Hypothyroidism, unspecified: Secondary | ICD-10-CM | POA: Diagnosis not present

## 2017-10-07 DIAGNOSIS — Z0000 Encounter for general adult medical examination without abnormal findings: Secondary | ICD-10-CM

## 2017-10-07 DIAGNOSIS — Z01419 Encounter for gynecological examination (general) (routine) without abnormal findings: Secondary | ICD-10-CM

## 2017-10-07 DIAGNOSIS — Z1239 Encounter for other screening for malignant neoplasm of breast: Secondary | ICD-10-CM

## 2017-10-07 LAB — HEMOCCULT GUIAC POC 1CARD (OFFICE): FECAL OCCULT BLD: NEGATIVE

## 2017-10-07 MED ORDER — DIVALPROEX SODIUM 125 MG PO DR TAB: 125.0000 mg | DELAYED_RELEASE_TABLET | Freq: Every day | ORAL | 0 refills | Status: DC

## 2017-10-07 MED ORDER — LEVOTHYROXINE SODIUM 175 MCG PO TABS: 175.0000 ug | ORAL_TABLET | ORAL | 1 refills | Status: DC

## 2017-10-07 NOTE — Assessment & Plan Note (Signed)
Depakote level decreased/ Will increase immediate depakote to bid until neurology consult

## 2017-10-07 NOTE — Progress Notes (Addendum)
Name: Karen Franco   MRN: [id number]    DOB: Oct 13, 1960   Date:10/07/2017       Progress Note  Subjective  Chief Complaint  Chief Complaint  Patient presents with  . Annual Exam    pap and mammo    Patient presents for annual physical exam with Pap and pelvic.    Thyroid Problem  Presents for follow-up visit. Symptoms include weight gain. Patient reports no anxiety, cold intolerance, constipation, depressed mood, diaphoresis, diarrhea, dry skin, fatigue, hair loss, heat intolerance, hoarse voice, leg swelling, menstrual problem, nail problem, palpitations, tremors, visual change or weight loss. The symptoms have been stable.  Seizures   This is a recurrent problem. The current episode started more than 1 week ago (breakthrough 4-6 week /since new refill). The problem has been gradually worsening. Number of times: none for a week. Pertinent negatives include no headaches, no sore throat, no chest pain, no cough, no nausea and no diarrhea. absence The episode was witnessed. There was the sensation of an aura present. Possible causes include medication or dosage change.    Acquired hypothyroidism TSH elevated /will increase to daily 175 mcgm  Absence seizure disorder Depakote level decreased/ Will increase immediate depakote to bid until neurology consult   Past Medical History:  Diagnosis Date  . Dysrhythmia    palpatations if thyroid meds are "off"  . Hypothyroidism   . Motion sickness    cars - back seat  . Seizures ([place])    22 yrs ago - controlled on Depakote  . Thyroid disease     Past Surgical History:  Procedure Laterality Date  . APPENDECTOMY    . BREAST CYST ASPIRATION     not sure which side-neg  . CESAREAN SECTION     x 2  . COLONOSCOPY WITH PROPOFOL N/A 01/20/2015   Procedure: COLONOSCOPY WITH PROPOFOL;  Surgeon: Lucilla Lame, MD;  Location: [facility];  Service: Endoscopy;  Laterality: N/A;  . MOHS SURGERY  05/2015   L) ear  . POLYPECTOMY   01/20/2015   Procedure: POLYPECTOMY;  Surgeon: Lucilla Lame, MD;  Location: [facility];  Service: Endoscopy;;  . THYROIDECTOMY    . VAGINAL HYSTERECTOMY      Family History  Problem Relation Age of Onset  . Cancer Mother   . Heart disease Father   . Diabetes Maternal Grandfather   . Breast cancer Paternal Aunt     Social History   Socioeconomic History  . Marital status: Married    Spouse name: Not on file  . Number of children: Not on file  . Years of education: Not on file  . Highest education level: Not on file  Occupational History  . Not on file  Social Needs  . Financial resource strain: Not on file  . Food insecurity:    Worry: Not on file    Inability: Not on file  . Transportation needs:    Medical: Not on file    Non-medical: Not on file  Tobacco Use  . Smoking status: Never Smoker  . Smokeless tobacco: Never Used  Substance and Sexual Activity  . Alcohol use: Yes    Alcohol/week: 0.0 oz    Comment: 1 glass wine/month  . Drug use: No  . Sexual activity: Yes  Lifestyle  . Physical activity:    Days per week: Not on file    Minutes per session: Not on file  . Stress: Not on file  Relationships  . Social connections:  Talks on phone: Not on file    Gets together: Not on file    Attends religious service: Not on file    Active member of club or organization: Not on file    Attends meetings of clubs or organizations: Not on file    Relationship status: Not on file  . Intimate partner violence:    Fear of current or ex partner: Not on file    Emotionally abused: Not on file    Physically abused: Not on file    Forced sexual activity: Not on file  Other Topics Concern  . Not on file  Social History Narrative  . Not on file    No Known Allergies  Outpatient Medications Prior to Visit  Medication Sig Dispense Refill  . Calcium Carbonate-Vitamin D (CALCIUM-VITAMIN D) 500-200 MG-UNIT per tablet Take 1 tablet by mouth daily.    .  cholecalciferol (VITAMIN D) 1000 units tablet Take 1,000 Units by mouth daily.    . clonazePAM (KLONOPIN) 0.5 MG tablet Take 1 tablet (0.5 mg total) by mouth daily. #30 per 6 months 30 tablet 0  . divalproex (DEPAKOTE ER) 500 MG 24 hr tablet Take 1 tablet (500 mg total) by mouth daily. 30 tablet 11  . Multiple Vitamin (MULTIVITAMIN) capsule Take 1 capsule by mouth daily.    . vitamin B-12 (CYANOCOBALAMIN) 1000 MCG tablet Take 1,000 mcg by mouth daily.    . divalproex (DEPAKOTE) 125 MG DR tablet Take 1 tablet (125 mg total) by mouth daily. 30 tablet 11  . levothyroxine (SYNTHROID, LEVOTHROID) 150 MCG tablet Take 1 tablet (150 mcg total) by mouth 3 (three) times a week. 22 tablet 11  . levothyroxine (SYNTHROID, LEVOTHROID) 175 MCG tablet Take 1 tablet (175 mcg total) by mouth 4 (four) times a week. 30 tablet 11   No facility-administered medications prior to visit.     Review of Systems  Constitutional: Positive for weight gain. Negative for chills, diaphoresis, fatigue, fever, malaise/fatigue and weight loss.  HENT: Negative for ear discharge, ear pain, hoarse voice and sore throat.   Eyes: Negative for blurred vision.  Respiratory: Negative for cough, sputum production, shortness of breath and wheezing.   Cardiovascular: Negative for chest pain, palpitations and leg swelling.  Gastrointestinal: Negative for abdominal pain, blood in stool, constipation, diarrhea, heartburn, melena and nausea.  Genitourinary: Negative for dysuria, frequency, hematuria, menstrual problem and urgency.  Musculoskeletal: Negative for back pain, joint pain, myalgias and neck pain.  Skin: Negative for rash.  Neurological: Positive for seizures. Negative for dizziness, tingling, tremors, sensory change, focal weakness and headaches.  Endo/Heme/Allergies: Negative for environmental allergies, cold intolerance, heat intolerance and polydipsia. Does not bruise/bleed easily.  Psychiatric/Behavioral: Negative for  depression and suicidal ideas. The patient is not nervous/anxious and does not have insomnia.      Objective  Vitals:   10/07/17 0843  BP: 120/80  Pulse: 64  Weight: 215 lb (97.5 kg)  Height: 5\' 7"  (1.702 m)    Physical Exam  Constitutional: Vital signs are normal. She appears well-developed. No distress.  HENT:  Head: Normocephalic and atraumatic.  Right Ear: Hearing, tympanic membrane, external ear and ear canal normal.  Left Ear: Hearing, tympanic membrane, external ear and ear canal normal.  Nose: Nose normal. No mucosal edema.  Mouth/Throat: Uvula is midline, oropharynx is clear and moist and mucous membranes are normal. Normal dentition.  Eyes: Pupils are equal, round, and reactive to light. Conjunctivae, EOM and lids are normal. Right eye exhibits no  discharge. Left eye exhibits no discharge.  Fundoscopic exam:      The right eye shows no arteriolar narrowing, no AV nicking and no exudate.       The left eye shows no arteriolar narrowing, no AV nicking and no exudate.  Neck: Trachea normal and normal range of motion. Neck supple. Normal carotid pulses, no hepatojugular reflux and no JVD present. Carotid bruit is not present. No thyromegaly present.  Cardiovascular: Normal rate, regular rhythm, S1 normal, S2 normal, normal heart sounds and intact distal pulses. PMI is not displaced. Exam reveals no gallop, no S3, no S4, no friction rub and no decreased pulses.  No murmur heard.  No systolic murmur is present.  No diastolic murmur is present. Pulses:      Carotid pulses are 2+ on the right side, and 2+ on the left side.      Radial pulses are 2+ on the right side, and 2+ on the left side.       Femoral pulses are 2+ on the right side, and 2+ on the left side.      Popliteal pulses are 2+ on the right side, and 2+ on the left side.       Dorsalis pedis pulses are 2+ on the right side, and 2+ on the left side.       Posterior tibial pulses are 2+ on the right side, and 2+ on  the left side.  Pulmonary/Chest: Effort normal and breath sounds normal. She has no decreased breath sounds. Chest wall is not dull to percussion. She exhibits no mass, no tenderness, no laceration, no crepitus, no edema, no deformity, no swelling and no retraction. Right breast exhibits no inverted nipple, no mass, no nipple discharge, no skin change and no tenderness. Left breast exhibits no inverted nipple, no mass, no nipple discharge, no skin change and no tenderness. No breast swelling, tenderness, discharge or bleeding. Breasts are symmetrical.  Abdominal: Soft. Normal appearance and bowel sounds are normal. She exhibits no distension and no mass. There is no tenderness. There is no guarding.  Genitourinary: Rectum normal and vagina normal. Pelvic exam was performed with patient supine. No labial fusion. There is no rash, tenderness, lesion or injury on the right labia. There is no rash, tenderness, lesion or injury on the left labia. Right adnexum displays no mass, no tenderness and no fullness. Left adnexum displays no mass, no tenderness and no fullness.  Musculoskeletal: Normal range of motion. She exhibits no edema.  Lymphadenopathy:       Head (right side): No submandibular adenopathy present.       Head (left side): No submandibular adenopathy present.    She has no cervical adenopathy.    She has no axillary adenopathy.  Neurological: She is alert. She has normal strength and normal reflexes. No cranial nerve deficit or sensory deficit.  Reflex Scores:      Tricep reflexes are 2+ on the right side and 2+ on the left side.      Bicep reflexes are 2+ on the right side and 2+ on the left side.      Brachioradialis reflexes are 2+ on the right side and 2+ on the left side.      Patellar reflexes are 2+ on the right side and 2+ on the left side.      Achilles reflexes are 2+ on the right side and 2+ on the left side. Skin: Skin is warm, dry and intact. She is not diaphoretic.  No pallor.       Assessment & Plan  Problem List Items Addressed This Visit      Endocrine   Acquired hypothyroidism    TSH elevated /will increase to daily 175 mcgm      Relevant Medications   levothyroxine (SYNTHROID, LEVOTHROID) 175 MCG tablet    Other Visit Diagnoses    Annual physical exam    -  Primary   Patient with normal exam and gyn exam normal   Relevant Orders   Lipid panel   Pap IG (Image Guided)   POCT Occult Blood Stool (Completed)   Absence epileptic syndrome, not intractable, without status epilepticus ([place])       send to neurology for breakthrough seizures   Relevant Medications   divalproex (DEPAKOTE) 125 MG DR tablet   Encounter for gynecological examination with Papanicolaou smear of cervix       Breast cancer screening       scheduled mammo   Relevant Orders   MM Digital Screening   Screening for vaginal cancer       Relevant Orders   Pap IG (Image Guided)   Colon cancer screening       Relevant Orders   POCT Occult Blood Stool (Completed)      Meds ordered this encounter  Medications  . divalproex (DEPAKOTE) 125 MG DR tablet    Sig: Take 1 tablet (125 mg total) by mouth daily. Take an extra for coverage/ break through    Dispense:  44 tablet    Refill:  0  . levothyroxine (SYNTHROID, LEVOTHROID) 175 MCG tablet    Sig: Take 1 tablet (175 mcg total) by mouth 4 (four) times a week. Change to daily dosing- drop 173mcg    Dispense:  30 tablet    Refill:  1  Karen Franco is a 57 y.o. female who presents today for her Complete Annual Exam. She feels well. She reports exercising some. She reports she is sleeping well.  Immunizations are reviewed and recommendations provided.   Age appropriate screening tests are discussed. Counseling given for risk factor reduction interventions.   Dr. [facility]  10/07/17

## 2017-10-07 NOTE — Assessment & Plan Note (Signed)
TSH elevated /will increase to daily 175 mcgm

## 2017-10-08 LAB — LIPID PANEL
CHOL/HDL RATIO: 3.2 ratio (ref 0.0–4.4)
Cholesterol, Total: 246 mg/dL — ABNORMAL HIGH (ref 100–199)
HDL: 76 mg/dL (ref >39)
LDL Calculated: 149 mg/dL — ABNORMAL HIGH (ref 0–99)
TRIGLYCERIDES: 107 mg/dL (ref 0–149)
VLDL Cholesterol Cal: 21 mg/dL (ref 5–40)

## 2017-10-09 LAB — PAP IG (IMAGE GUIDED): PAP Smear Comment: 0

## 2017-10-20 ENCOUNTER — Telehealth: Payer: Self-pay | Admitting: Family Medicine

## 2017-10-20 ENCOUNTER — Ambulatory Visit
Admission: RE | Admit: 2017-10-20 | Discharge: 2017-10-20 | Disposition: A | Discharge status: Home / Self Care | Payer: 59 | Source: Ambulatory Visit | Attending: Family Medicine | Admitting: Family Medicine

## 2017-10-20 DIAGNOSIS — Z1231 Encounter for screening mammogram for malignant neoplasm of breast: Secondary | ICD-10-CM | POA: Diagnosis not present

## 2017-10-20 DIAGNOSIS — Z1239 Encounter for other screening for malignant neoplasm of breast: Secondary | ICD-10-CM

## 2017-10-20 HISTORY — DX: Malignant (primary) neoplasm, unspecified: C80.1

## 2017-10-20 NOTE — Telephone Encounter (Signed)
Ms Blough walked in requesting to speak with nurse about her med's request at the pharmacy. She will also call and lvm for request change at the pharmacy on Levothyroxine 150mg (wrong dose).

## 2017-11-19 ENCOUNTER — Ambulatory Visit: Payer: Self-pay | Admitting: Family Medicine

## 2017-12-10 ENCOUNTER — Other Ambulatory Visit: Payer: Self-pay | Admitting: Family Medicine

## 2017-12-10 DIAGNOSIS — E039 Hypothyroidism, unspecified: Secondary | ICD-10-CM

## 2017-12-22 ENCOUNTER — Encounter: Payer: Self-pay | Admitting: Family Medicine

## 2017-12-22 ENCOUNTER — Ambulatory Visit (INDEPENDENT_AMBULATORY_CARE_PROVIDER_SITE_OTHER): Payer: 59 | Admitting: Family Medicine

## 2017-12-22 VITALS — BP 130/80 | HR 76 | Ht 67.0 in | Wt 219.0 lb

## 2017-12-22 DIAGNOSIS — G40A09 Absence epileptic syndrome, not intractable, without status epilepticus: Secondary | ICD-10-CM

## 2017-12-22 NOTE — Progress Notes (Signed)
Name: Karen Franco   MRN: [id number]    DOB: 03-11-1961   Date:12/22/2017       Progress Note  Subjective  Chief Complaint  Chief Complaint  Patient presents with  . Follow-up    seizures/ went to Dr Melrose Nakayama for EEG- doesn't know results    Seizures   This is a chronic problem. The current episode started more than 1 week ago. The problem has not changed since onset.Number of times: none in past week. The most recent episode lasted 30 to 120 seconds. Pertinent negatives include no headaches, no sore throat, no chest pain, no cough, no nausea and no diarrhea. nausea/post ictal disorientation Possible causes include medication or dosage change.    Absence seizure disorder Followed with [facility].  Continue Depakote at present dosing. Upcoming appt. Aug 20th at 1:00 Pm.   Past Medical History:  Diagnosis Date  . Cancer ([place])    melanoma- on left ear  . Dysrhythmia    palpatations if thyroid meds are "off"  . Hypothyroidism   . Motion sickness    cars - back seat  . Seizures ([place])    22 yrs ago - controlled on Depakote  . Thyroid disease     Past Surgical History:  Procedure Laterality Date  . APPENDECTOMY    . BREAST CYST ASPIRATION     not sure which side-neg  . CESAREAN SECTION     x 2  . COLONOSCOPY WITH PROPOFOL N/A 01/20/2015   Procedure: COLONOSCOPY WITH PROPOFOL;  Surgeon: Lucilla Lame, MD;  Location: [facility];  Service: Endoscopy;  Laterality: N/A;  . MOHS SURGERY  05/2015   L) ear  . POLYPECTOMY  01/20/2015   Procedure: POLYPECTOMY;  Surgeon: Lucilla Lame, MD;  Location: [facility];  Service: Endoscopy;;  . THYROIDECTOMY    . VAGINAL HYSTERECTOMY      Family History  Problem Relation Age of Onset  . Cancer Mother   . Heart disease Father   . Diabetes Maternal Grandfather   . Breast cancer Paternal Aunt     Social History   Socioeconomic History  . Marital status: Married    Spouse name: Not on file  . Number of children:  Not on file  . Years of education: Not on file  . Highest education level: Not on file  Occupational History  . Not on file  Social Needs  . Financial resource strain: Not on file  . Food insecurity:    Worry: Not on file    Inability: Not on file  . Transportation needs:    Medical: Not on file    Non-medical: Not on file  Tobacco Use  . Smoking status: Never Smoker  . Smokeless tobacco: Never Used  Substance and Sexual Activity  . Alcohol use: Yes    Alcohol/week: 0.0 standard drinks    Comment: 1 glass wine/month  . Drug use: No  . Sexual activity: Yes  Lifestyle  . Physical activity:    Days per week: Not on file    Minutes per session: Not on file  . Stress: Not on file  Relationships  . Social connections:    Talks on phone: Not on file    Gets together: Not on file    Attends religious service: Not on file    Active member of club or organization: Not on file    Attends meetings of clubs or organizations: Not on file    Relationship status: Not on file  .  Intimate partner violence:    Fear of current or ex partner: Not on file    Emotionally abused: Not on file    Physically abused: Not on file    Forced sexual activity: Not on file  Other Topics Concern  . Not on file  Social History Narrative  . Not on file    No Known Allergies  Outpatient Medications Prior to Visit  Medication Sig Dispense Refill  . Calcium Carbonate-Vitamin D (CALCIUM-VITAMIN D) 500-200 MG-UNIT per tablet Take 1 tablet by mouth daily.    . cholecalciferol (VITAMIN D) 1000 units tablet Take 1,000 Units by mouth daily.    . clonazePAM (KLONOPIN) 0.5 MG tablet Take 1 tablet (0.5 mg total) by mouth daily. #30 per 6 months 30 tablet 0  . divalproex (DEPAKOTE ER) 500 MG 24 hr tablet Take 1 tablet (500 mg total) by mouth daily. 30 tablet 11  . divalproex (DEPAKOTE) 125 MG DR tablet Take 1 tablet (125 mg total) by mouth daily. Take an extra for coverage/ break through 44 tablet 0  .  levothyroxine (SYNTHROID, LEVOTHROID) 175 MCG tablet TAKE 1 TABLET BY MOUTH DAILY 30 tablet 0  . Multiple Vitamin (MULTIVITAMIN) capsule Take 1 capsule by mouth daily.    . vitamin B-12 (CYANOCOBALAMIN) 1000 MCG tablet Take 1,000 mcg by mouth daily.     No facility-administered medications prior to visit.     Review of Systems  Constitutional: Negative for chills, fever, malaise/fatigue and weight loss.  HENT: Negative for ear discharge, ear pain and sore throat.   Eyes: Negative for blurred vision.  Respiratory: Negative for cough, sputum production, shortness of breath and wheezing.   Cardiovascular: Negative for chest pain, palpitations and leg swelling.  Gastrointestinal: Negative for abdominal pain, blood in stool, constipation, diarrhea, heartburn, melena and nausea.  Genitourinary: Negative for dysuria, frequency, hematuria and urgency.  Musculoskeletal: Negative for back pain, joint pain, myalgias and neck pain.  Skin: Negative for rash.  Neurological: Positive for seizures. Negative for dizziness, tingling, sensory change, focal weakness and headaches.  Endo/Heme/Allergies: Negative for environmental allergies and polydipsia. Does not bruise/bleed easily.  Psychiatric/Behavioral: Negative for depression and suicidal ideas. The patient is not nervous/anxious and does not have insomnia.      Objective  Vitals:   12/22/17 1038  BP: 130/80  Pulse: 76  Weight: 219 lb (99.3 kg)  Height: 5\' 7"  (1.702 m)    Physical Exam  Constitutional: No distress.  HENT:  Head: Normocephalic and atraumatic.  Right Ear: External ear normal.  Left Ear: External ear normal.  Nose: Nose normal.  Mouth/Throat: Oropharynx is clear and moist.  Eyes: Pupils are equal, round, and reactive to light. Conjunctivae and EOM are normal. Right eye exhibits no discharge. Left eye exhibits no discharge.  Neck: Normal range of motion. Neck supple. No JVD present. No thyromegaly present.  Cardiovascular:  Normal rate, regular rhythm, normal heart sounds and intact distal pulses. Exam reveals no gallop and no friction rub.  No murmur heard. Pulmonary/Chest: Effort normal and breath sounds normal.  Abdominal: Soft. Bowel sounds are normal. She exhibits no mass. There is no tenderness. There is no guarding.  Musculoskeletal: Normal range of motion. She exhibits no edema.  Lymphadenopathy:    She has no cervical adenopathy.  Neurological: She is alert. She has normal reflexes.  Skin: Skin is warm and dry. She is not diaphoretic.      Assessment & Plan  Problem List Items Addressed This Visit  Nervous and Auditory   Absence seizure disorder ([place]) - Primary    Followed with [facility].  Continue Depakote at present dosing. Upcoming appt. Aug 20th at 1:00 Pm.         No orders of the defined types were placed in this encounter.     Dr. [facility]  12/22/17

## 2017-12-22 NOTE — Assessment & Plan Note (Signed)
Followed with [facility].  Continue Depakote at present dosing. Upcoming appt. Aug 20th at 1:00 Pm.

## 2018-01-20 ENCOUNTER — Other Ambulatory Visit: Payer: Self-pay | Admitting: Family Medicine

## 2018-01-20 DIAGNOSIS — E039 Hypothyroidism, unspecified: Secondary | ICD-10-CM

## 2018-01-23 ENCOUNTER — Ambulatory Visit (INDEPENDENT_AMBULATORY_CARE_PROVIDER_SITE_OTHER): Payer: 59 | Admitting: Family Medicine

## 2018-01-23 ENCOUNTER — Encounter: Payer: Self-pay | Admitting: Family Medicine

## 2018-01-23 VITALS — BP 124/82 | HR 65 | Resp 16 | Ht 67.0 in | Wt 218.0 lb

## 2018-01-23 DIAGNOSIS — Z23 Encounter for immunization: Secondary | ICD-10-CM | POA: Diagnosis not present

## 2018-01-23 DIAGNOSIS — E039 Hypothyroidism, unspecified: Secondary | ICD-10-CM

## 2018-01-23 NOTE — Progress Notes (Signed)
Name: Karen Franco   MRN: [id number]    DOB: 1961-03-24   Date:01/23/2018       Progress Note  Subjective  Chief Complaint  Chief Complaint  Patient presents with  . Thyroid Problem    Thyroid Problem  Presents for follow-up visit. Patient reports no anxiety, cold intolerance, constipation, depressed mood, diaphoresis, diarrhea, dry skin, fatigue, hair loss, heat intolerance, hoarse voice, leg swelling, menstrual problem, nail problem, palpitations, tremors, visual change, weight gain or weight loss. The symptoms have been stable.    Acquired hypothyroidism Chronic Controlled Will check tsh and adjust levothyroxin  accordingly   Past Medical History:  Diagnosis Date  . Cancer ([place])    melanoma- on left ear  . Dysrhythmia    palpatations if thyroid meds are "off"  . Hypothyroidism   . Motion sickness    cars - back seat  . Seizures ([place])    22 yrs ago - controlled on Depakote  . Thyroid disease     Past Surgical History:  Procedure Laterality Date  . APPENDECTOMY    . BREAST CYST ASPIRATION     not sure which side-neg  . CESAREAN SECTION     x 2  . COLONOSCOPY WITH PROPOFOL N/A 01/20/2015   Procedure: COLONOSCOPY WITH PROPOFOL;  Surgeon: Lucilla Lame, MD;  Location: [facility];  Service: Endoscopy;  Laterality: N/A;  . MOHS SURGERY  05/2015   L) ear  . POLYPECTOMY  01/20/2015   Procedure: POLYPECTOMY;  Surgeon: Lucilla Lame, MD;  Location: [facility];  Service: Endoscopy;;  . THYROIDECTOMY    . VAGINAL HYSTERECTOMY      Family History  Problem Relation Age of Onset  . Cancer Mother   . Heart disease Father   . Diabetes Maternal Grandfather   . Breast cancer Paternal Aunt     Social History   Socioeconomic History  . Marital status: Married    Spouse name: Not on file  . Number of children: Not on file  . Years of education: Not on file  . Highest education level: Not on file  Occupational History  . Not on file  Social Needs  .  Financial resource strain: Not on file  . Food insecurity:    Worry: Not on file    Inability: Not on file  . Transportation needs:    Medical: Not on file    Non-medical: Not on file  Tobacco Use  . Smoking status: Never Smoker  . Smokeless tobacco: Never Used  Substance and Sexual Activity  . Alcohol use: Yes    Alcohol/week: 0.0 standard drinks    Comment: 1 glass wine/month  . Drug use: No  . Sexual activity: Yes  Lifestyle  . Physical activity:    Days per week: Not on file    Minutes per session: Not on file  . Stress: Not on file  Relationships  . Social connections:    Talks on phone: Not on file    Gets together: Not on file    Attends religious service: Not on file    Active member of club or organization: Not on file    Attends meetings of clubs or organizations: Not on file    Relationship status: Not on file  . Intimate partner violence:    Fear of current or ex partner: Not on file    Emotionally abused: Not on file    Physically abused: Not on file    Forced sexual activity: Not  on file  Other Topics Concern  . Not on file  Social History Narrative  . Not on file    No Known Allergies  Outpatient Medications Prior to Visit  Medication Sig Dispense Refill  . Calcium Carbonate-Vitamin D (CALCIUM-VITAMIN D) 500-200 MG-UNIT per tablet Take 1 tablet by mouth daily.    . cholecalciferol (VITAMIN D) 1000 units tablet Take 1,000 Units by mouth daily.    . clonazePAM (KLONOPIN) 0.5 MG tablet Take 1 tablet (0.5 mg total) by mouth daily. #30 per 6 months 30 tablet 0  . levothyroxine (SYNTHROID, LEVOTHROID) 175 MCG tablet TAKE 1 TABLET BY MOUTH DAILY 30 tablet 0  . Multiple Vitamin (MULTIVITAMIN) capsule Take 1 capsule by mouth daily.    . vitamin B-12 (CYANOCOBALAMIN) 1000 MCG tablet Take 1,000 mcg by mouth daily.    . divalproex (DEPAKOTE ER) 500 MG 24 hr tablet Take 1 tablet (500 mg total) by mouth daily. 30 tablet 11  . divalproex (DEPAKOTE) 125 MG DR tablet  Take 1 tablet (125 mg total) by mouth daily. Take an extra for coverage/ break through 44 tablet 0   No facility-administered medications prior to visit.     Review of Systems  Constitutional: Negative for chills, diaphoresis, fatigue, fever, malaise/fatigue, weight gain and weight loss.  HENT: Negative for ear discharge, ear pain, hoarse voice and sore throat.   Eyes: Negative for blurred vision.  Respiratory: Negative for cough, sputum production, shortness of breath and wheezing.   Cardiovascular: Negative for chest pain, palpitations and leg swelling.  Gastrointestinal: Negative for abdominal pain, blood in stool, constipation, diarrhea, heartburn, melena and nausea.  Genitourinary: Negative for dysuria, frequency, hematuria, menstrual problem and urgency.  Musculoskeletal: Negative for back pain, joint pain, myalgias and neck pain.  Skin: Negative for rash.  Neurological: Negative for dizziness, tingling, tremors, sensory change, focal weakness and headaches.  Endo/Heme/Allergies: Negative for environmental allergies, cold intolerance, heat intolerance and polydipsia. Does not bruise/bleed easily.  Psychiatric/Behavioral: Negative for depression and suicidal ideas. The patient is not nervous/anxious and does not have insomnia.      Objective  Vitals:   01/23/18 0810  BP: 124/82  Pulse: 65  Resp: 16  SpO2: 98%  Weight: 218 lb (98.9 kg)  Height: 5\' 7"  (1.702 m)    Physical Exam  Constitutional: She is oriented to person, place, and time. She appears well-developed and well-nourished.  HENT:  Head: Normocephalic.  Right Ear: External ear normal.  Left Ear: External ear normal.  Mouth/Throat: Oropharynx is clear and moist.  Eyes: Pupils are equal, round, and reactive to light. Conjunctivae and EOM are normal. Lids are everted and swept, no foreign bodies found. Left eye exhibits no hordeolum. No foreign body present in the left eye. Right conjunctiva is not injected. Left  conjunctiva is not injected. No scleral icterus.  Neck: Normal range of motion. Neck supple. Normal carotid pulses, no hepatojugular reflux and no JVD present. Carotid bruit is not present. No neck rigidity. No tracheal deviation, no edema and no erythema present. No thyroid mass and no thyromegaly present.  Cardiovascular: Normal rate, regular rhythm, normal heart sounds and intact distal pulses. Exam reveals no gallop and no friction rub.  No murmur heard. Pulmonary/Chest: Effort normal and breath sounds normal. No respiratory distress. She has no wheezes. She has no rales.  Abdominal: Soft. Bowel sounds are normal. She exhibits no mass. There is no hepatosplenomegaly. There is no tenderness. There is no rebound and no guarding.  Musculoskeletal: Normal range  of motion. She exhibits no edema or tenderness.  Lymphadenopathy:    She has no cervical adenopathy.  Neurological: She is alert and oriented to person, place, and time. She has normal strength. She displays normal reflexes. No cranial nerve deficit.  Skin: Skin is warm. No rash noted.  Psychiatric: She has a normal mood and affect. Her mood appears not anxious. She does not exhibit a depressed mood.  Nursing note and vitals reviewed.     Assessment & Plan  Problem List Items Addressed This Visit      Endocrine   Acquired hypothyroidism - Primary    Chronic Controlled Will check tsh and adjust levothyroxin  accordingly      Relevant Orders   TSH    Other Visit Diagnoses    Flu vaccine need       Dicussed and administered.   Relevant Orders   Flu Vaccine QUAD 6+ mos PF IM (Fluarix Quad PF)      No orders of the defined types were placed in this encounter.     Dr. [facility]  01/23/18

## 2018-01-23 NOTE — Assessment & Plan Note (Signed)
Chronic Controlled Will check tsh and adjust levothyroxin  accordingly

## 2018-01-24 LAB — TSH: TSH: 9.79 u[IU]/mL — AB (ref 0.450–4.500)

## 2018-01-27 ENCOUNTER — Other Ambulatory Visit: Payer: Self-pay

## 2018-01-27 MED ORDER — LEVOTHYROXINE SODIUM 200 MCG PO TABS: 200.0000 ug | ORAL_TABLET | Freq: Every day | ORAL | 0 refills | Status: DC

## 2018-03-06 ENCOUNTER — Other Ambulatory Visit: Payer: 59

## 2018-03-06 DIAGNOSIS — E039 Hypothyroidism, unspecified: Secondary | ICD-10-CM

## 2018-03-07 ENCOUNTER — Other Ambulatory Visit: Payer: Self-pay | Admitting: Family Medicine

## 2018-03-07 LAB — THYROID PANEL WITH TSH
FREE THYROXINE INDEX: 2.3 (ref 1.2–4.9)
T3 UPTAKE RATIO: 25 % (ref 24–39)
T4, Total: 9.1 ug/dL (ref 4.5–12.0)
TSH: 6.44 u[IU]/mL — ABNORMAL HIGH (ref 0.450–4.500)

## 2018-03-09 ENCOUNTER — Other Ambulatory Visit: Payer: Self-pay

## 2018-03-09 DIAGNOSIS — E039 Hypothyroidism, unspecified: Secondary | ICD-10-CM

## 2018-03-09 MED ORDER — LEVOTHYROXINE SODIUM 25 MCG PO TABS: 25.0000 ug | ORAL_TABLET | Freq: Every day | ORAL | 1 refills | Status: DC

## 2018-04-13 ENCOUNTER — Other Ambulatory Visit: Payer: Self-pay | Admitting: Family Medicine

## 2018-05-14 ENCOUNTER — Other Ambulatory Visit: Payer: Self-pay

## 2018-05-14 DIAGNOSIS — E039 Hypothyroidism, unspecified: Secondary | ICD-10-CM

## 2018-05-14 MED ORDER — LEVOTHYROXINE SODIUM 25 MCG PO TABS: 25.0000 ug | ORAL_TABLET | Freq: Every day | ORAL | 0 refills | Status: DC

## 2018-05-14 MED ORDER — LEVOTHYROXINE SODIUM 200 MCG PO TABS: ORAL_TABLET | ORAL | 0 refills | Status: DC

## 2018-05-18 ENCOUNTER — Ambulatory Visit: Payer: Self-pay | Admitting: Family Medicine

## 2018-05-25 ENCOUNTER — Ambulatory Visit: Payer: 59

## 2018-05-25 DIAGNOSIS — E039 Hypothyroidism, unspecified: Secondary | ICD-10-CM

## 2018-05-26 ENCOUNTER — Other Ambulatory Visit: Payer: Self-pay

## 2018-05-26 DIAGNOSIS — E039 Hypothyroidism, unspecified: Secondary | ICD-10-CM

## 2018-05-26 LAB — THYROID PANEL WITH TSH
Free Thyroxine Index: 3.6 (ref 1.2–4.9)
T3 Uptake Ratio: 31 % (ref 24–39)
T4, Total: 11.6 ug/dL (ref 4.5–12.0)
TSH: 0.268 u[IU]/mL — ABNORMAL LOW (ref 0.450–4.500)

## 2018-05-26 MED ORDER — LEVOTHYROXINE SODIUM 25 MCG PO TABS: 25.0000 ug | ORAL_TABLET | Freq: Every day | ORAL | 2 refills | Status: DC

## 2018-05-26 MED ORDER — LEVOTHYROXINE SODIUM 200 MCG PO TABS: ORAL_TABLET | ORAL | 2 refills | Status: DC

## 2018-06-12 ENCOUNTER — Encounter: Payer: Self-pay | Admitting: Family Medicine

## 2018-06-12 ENCOUNTER — Ambulatory Visit (INDEPENDENT_AMBULATORY_CARE_PROVIDER_SITE_OTHER): Payer: 59 | Admitting: Family Medicine

## 2018-06-12 VITALS — BP 120/80 | HR 72 | Ht 67.0 in | Wt 213.0 lb

## 2018-06-12 DIAGNOSIS — T8090XA Unspecified complication following infusion and therapeutic injection, initial encounter: Secondary | ICD-10-CM | POA: Diagnosis not present

## 2018-06-12 NOTE — Progress Notes (Signed)
Date:  06/12/2018   Name:  Karen Franco   DOB:  1960/09/13   MRN:  [id number]   Chief Complaint: reaction to MMR (received MMR on back of R) arm on 05/22/2018- has left a knot on the back of arm. Is this normal for this long?)  Rash  This is a new (local reaction to mmr) problem. The current episode started 1 to 4 weeks ago (january 10th). The problem has been gradually improving since onset. The affected locations include the right arm. The rash is characterized by redness (burning pain). Associated with: MMR. Pertinent negatives include no anorexia, congestion, cough, diarrhea, eye pain, facial edema, fatigue, fever, joint pain, nail changes, rhinorrhea, shortness of breath, sore throat or vomiting. Treatments tried: advil. The treatment provided moderate relief.    Review of Systems  Constitutional: Negative.  Negative for chills, fatigue, fever and unexpected weight change.  HENT: Negative for congestion, ear discharge, ear pain, rhinorrhea, sinus pressure, sneezing and sore throat.   Eyes: Negative for photophobia, pain, discharge, redness and itching.  Respiratory: Negative for cough, shortness of breath, wheezing and stridor.   Gastrointestinal: Negative for abdominal pain, anorexia, blood in stool, constipation, diarrhea, nausea and vomiting.  Endocrine: Negative for cold intolerance, heat intolerance, polydipsia, polyphagia and polyuria.  Genitourinary: Negative for dysuria, flank pain, frequency, hematuria, menstrual problem, pelvic pain, urgency, vaginal bleeding and vaginal discharge.  Musculoskeletal: Negative for arthralgias, back pain, joint pain and myalgias.  Skin: Positive for rash. Negative for nail changes.  Allergic/Immunologic: Negative for environmental allergies and food allergies.  Neurological: Negative for dizziness, weakness, light-headedness, numbness and headaches.  Hematological: Negative for adenopathy. Does not bruise/bleed easily.    Psychiatric/Behavioral: Negative for dysphoric mood. The patient is not nervous/anxious.     Patient Active Problem List   Diagnosis Date Noted  . Special screening for malignant neoplasms, colon   . Benign neoplasm of ascending colon   . Acquired hypothyroidism 12/07/2014  . Taking medication for chronic disease 12/07/2014    No Known Allergies  Past Surgical History:  Procedure Laterality Date  . APPENDECTOMY    . BREAST CYST ASPIRATION     not sure which side-neg  . CESAREAN SECTION     x 2  . COLONOSCOPY WITH PROPOFOL N/A 01/20/2015   Procedure: COLONOSCOPY WITH PROPOFOL;  Surgeon: Lucilla Lame, MD;  Location: [facility];  Service: Endoscopy;  Laterality: N/A;  . MOHS SURGERY  05/2015   L) ear  . POLYPECTOMY  01/20/2015   Procedure: POLYPECTOMY;  Surgeon: Lucilla Lame, MD;  Location: [facility];  Service: Endoscopy;;  . THYROIDECTOMY    . VAGINAL HYSTERECTOMY      Social History   Tobacco Use  . Smoking status: Never Smoker  . Smokeless tobacco: Never Used  Substance Use Topics  . Alcohol use: Yes    Alcohol/week: 0.0 standard drinks    Comment: 1 glass wine/month  . Drug use: No     Medication list has been reviewed and updated.  Current Meds  Medication Sig  . Calcium Carbonate-Vitamin D (CALCIUM-VITAMIN D) 500-200 MG-UNIT per tablet Take 1 tablet by mouth daily.  . cholecalciferol (VITAMIN D) 1000 units tablet Take 1,000 Units by mouth daily.  Marland Kitchen levothyroxine (SYNTHROID, LEVOTHROID) 200 MCG tablet TAKE 1 TABLET BY MOUTH DAILY BEFORE BREAKFAST. TAKE IN PLACE OF 175 MCG  . levothyroxine (SYNTHROID, LEVOTHROID) 25 MCG tablet Take 1 tablet (25 mcg total) by mouth daily before breakfast. Add to 240mg= 2270m  daily due to labs  . Multiple Vitamin (MULTIVITAMIN) capsule Take 1 capsule by mouth daily.  . vitamin B-12 (CYANOCOBALAMIN) 1000 MCG tablet Take 1,000 mcg by mouth daily.    PHQ 2/9 Scores 01/23/2018 10/03/2017 11/07/2015 06/09/2015  PHQ - 2  Score 0 0 0 0  PHQ- 9 Score - 0 - -    Physical Exam Vitals signs and nursing note reviewed. Exam conducted with a chaperone present.  Constitutional:      General: She is not in acute distress.    Appearance: She is not diaphoretic.  HENT:     Head: Normocephalic and atraumatic.     Right Ear: External ear normal.     Left Ear: External ear normal.     Nose: Nose normal.  Eyes:     General:        Right eye: No discharge.        Left eye: No discharge.     Conjunctiva/sclera: Conjunctivae normal.     Pupils: Pupils are equal, round, and reactive to light.  Neck:     Musculoskeletal: Normal range of motion and neck supple.     Thyroid: No thyromegaly.     Vascular: No JVD.  Cardiovascular:     Rate and Rhythm: Normal rate and regular rhythm.     Heart sounds: Normal heart sounds. No murmur. No friction rub. No gallop.   Pulmonary:     Effort: Pulmonary effort is normal.     Breath sounds: Normal breath sounds.  Abdominal:     General: Bowel sounds are normal.     Palpations: Abdomen is soft. There is no mass.     Tenderness: There is no abdominal tenderness. There is no guarding.  Musculoskeletal: Normal range of motion.  Lymphadenopathy:     Cervical: No cervical adenopathy.  Skin:    General: Skin is warm and dry.     Comments: Indurated/ mild tender/ no erythema.  Neurological:     Mental Status: She is alert.     Deep Tendon Reflexes: Reflexes are normal and symmetric.     BP 120/80   Pulse 72   Ht 5' 7"  (1.702 m)   Wt 213 lb (96.6 kg)   BMI 33.36 kg/m   Assessment and Plan: 1. Injection site reaction, initial encounter And had a reaction to an MMR injection.  This occurred about 10 days later with induration and tenderness and some mild erythema and has gradually been improving.  Likely was a reaction to a additive to the MMR.  Was instructed to use antihistamine if the swelling should recur and nonsteroidal anti-inflammatory as needed pain.

## 2018-08-10 ENCOUNTER — Other Ambulatory Visit: Payer: Self-pay

## 2018-08-10 ENCOUNTER — Other Ambulatory Visit: Payer: 59

## 2018-08-10 DIAGNOSIS — E039 Hypothyroidism, unspecified: Secondary | ICD-10-CM

## 2018-08-10 NOTE — Progress Notes (Signed)
Labs printed.

## 2018-08-12 ENCOUNTER — Other Ambulatory Visit: Payer: Self-pay

## 2018-08-12 DIAGNOSIS — E039 Hypothyroidism, unspecified: Secondary | ICD-10-CM

## 2018-08-12 LAB — THYROID PANEL WITH TSH
Free Thyroxine Index: 2.3 (ref 1.2–4.9)
T3 Uptake Ratio: 20 % — ABNORMAL LOW (ref 24–39)
T4, Total: 11.7 ug/dL (ref 4.5–12.0)
TSH: 6.18 u[IU]/mL — ABNORMAL HIGH (ref 0.450–4.500)

## 2018-08-12 MED ORDER — LEVOTHYROXINE SODIUM 200 MCG PO TABS: ORAL_TABLET | ORAL | 2 refills | Status: DC

## 2018-08-12 MED ORDER — LEVOTHYROXINE SODIUM 25 MCG PO TABS: 25.0000 ug | ORAL_TABLET | Freq: Every day | ORAL | 2 refills | Status: DC

## 2018-08-19 ENCOUNTER — Encounter: Payer: Self-pay | Admitting: Emergency Medicine

## 2018-08-19 ENCOUNTER — Ambulatory Visit
Admission: EM | Admit: 2018-08-19 | Discharge: 2018-08-19 | Disposition: A | Discharge status: Home / Self Care | Payer: 59 | Attending: Family Medicine | Admitting: Family Medicine

## 2018-08-19 ENCOUNTER — Other Ambulatory Visit: Payer: Self-pay

## 2018-08-19 DIAGNOSIS — R319 Hematuria, unspecified: Secondary | ICD-10-CM | POA: Diagnosis not present

## 2018-08-19 DIAGNOSIS — N390 Urinary tract infection, site not specified: Secondary | ICD-10-CM

## 2018-08-19 LAB — URINALYSIS, COMPLETE (UACMP) WITH MICROSCOPIC
Glucose, UA: NEGATIVE mg/dL
Ketones, ur: NEGATIVE mg/dL
Nitrite: POSITIVE — AB
Protein, ur: 100 mg/dL — AB
Specific Gravity, Urine: 1.025 (ref 1.005–1.030)
pH: 5.5 (ref 5.0–8.0)

## 2018-08-19 MED ORDER — CEPHALEXIN 500 MG PO CAPS: 500.0000 mg | ORAL_CAPSULE | Freq: Two times a day (BID) | ORAL | 0 refills | Status: AC

## 2018-08-19 NOTE — ED Provider Notes (Signed)
[facility] ____________________________________________  Time seen: Approximately 6:53 PM  I have reviewed the triage vital signs and the nursing notes.   HISTORY  Chief Complaint Dysuria   HPI Karen Franco is a 58 y.o. female presenting for evaluation of urinary frequency, burning with urination that started yesterday, and worsened today.  States today he also noticed some blood in her urine.  Denies any vaginal bleeding, vaginal discharge or vaginal discomfort.  Denies abdominal pain, back, fevers.  Did buy some over-the-counter Azo to begin taking.  Denies other aggravating alleviating factors.  States feels similar to previous urinary infections.  Reports otherwise feeling well denies other complaints.  No recent sickness.  No recent antibiotic use.  Juline Patch, MD: PCP  Past Medical History:  Diagnosis Date  . Cancer ([place])    melanoma- on left ear  . Dysrhythmia    palpatations if thyroid meds are "off"  . Hypothyroidism   . Motion sickness    cars - back seat  . Seizures ([place])    22 yrs ago - controlled on Depakote  . Thyroid disease     Patient Active Problem List   Diagnosis Date Noted  . Special screening for malignant neoplasms, colon   . Benign neoplasm of ascending colon   . Acquired hypothyroidism 12/07/2014  . Taking medication for chronic disease 12/07/2014    Past Surgical History:  Procedure Laterality Date  . APPENDECTOMY    . BREAST CYST ASPIRATION     not sure which side-neg  . CESAREAN SECTION     x 2  . COLONOSCOPY WITH PROPOFOL N/A 01/20/2015   Procedure: COLONOSCOPY WITH PROPOFOL;  Surgeon: Lucilla Lame, MD;  Location: [place];  Service: Endoscopy;  Laterality: N/A;  . MOHS SURGERY  05/2015   L) ear  . POLYPECTOMY  01/20/2015   Procedure: POLYPECTOMY;  Surgeon: Lucilla Lame, MD;  Location: [place];  Service: Endoscopy;;  . THYROIDECTOMY    . VAGINAL HYSTERECTOMY       No current  facility-administered medications for this encounter.   Current Outpatient Medications:  .  Calcium Carbonate-Vitamin D (CALCIUM-VITAMIN D) 500-200 MG-UNIT per tablet, Take 1 tablet by mouth daily., Disp: , Rfl:  .  cholecalciferol (VITAMIN D) 1000 units tablet, Take 1,000 Units by mouth daily., Disp: , Rfl:  .  clonazePAM (KLONOPIN) 0.5 MG tablet, Take 1 tablet (0.5 mg total) by mouth daily. #30 per 6 months, Disp: 30 tablet, Rfl: 0 .  levothyroxine (SYNTHROID, LEVOTHROID) 200 MCG tablet, TAKE 1 TABLET BY MOUTH DAILY BEFORE BREAKFAST. TAKE IN PLACE OF 175 MCG, Disp: 30 tablet, Rfl: 2 .  levothyroxine (SYNTHROID, LEVOTHROID) 25 MCG tablet, Take 1 tablet (25 mcg total) by mouth daily before breakfast. Add to 238mcg= 28mcg daily due to labs, Disp: 30 tablet, Rfl: 2 .  Multiple Vitamin (MULTIVITAMIN) capsule, Take 1 capsule by mouth daily., Disp: , Rfl:  .  vitamin B-12 (CYANOCOBALAMIN) 1000 MCG tablet, Take 1,000 mcg by mouth daily., Disp: , Rfl:  .  cephALEXin (KEFLEX) 500 MG capsule, Take 1 capsule (500 mg total) by mouth 2 (two) times daily for 7 days., Disp: 14 capsule, Rfl: 0  Allergies Patient has no known allergies.  Family History  Problem Relation Age of Onset  . Cancer Mother   . Heart disease Father   . Diabetes Maternal Grandfather   . Breast cancer Paternal Aunt     Social History Social History   Tobacco Use  . Smoking  status: Never Smoker  . Smokeless tobacco: Never Used  Substance Use Topics  . Alcohol use: Yes    Alcohol/week: 0.0 standard drinks    Comment: 1 glass wine/month  . Drug use: No    Review of Systems Constitutional: No fever Cardiovascular: Denies chest pain. Respiratory: Denies shortness of breath. Gastrointestinal: No abdominal pain.   Genitourinary: positive for dysuria. Musculoskeletal: Negative for back pain. Skin: Negative for rash.   ____________________________________________   PHYSICAL EXAM:  VITAL SIGNS: ED Triage Vitals   Enc Vitals Group     BP 08/19/18 1823 129/77     Pulse Rate 08/19/18 1823 80     Resp 08/19/18 1823 14     Temp 08/19/18 1823 98.1 F (36.7 C)     Temp Source 08/19/18 1823 Oral     SpO2 08/19/18 1823 98 %     Weight 08/19/18 1820 212 lb (96.2 kg)     Height 08/19/18 1820 5' 7.5" (1.715 m)     Head Circumference --      Peak Flow --      Pain Score 08/19/18 1820 8     Pain Loc --      Pain Edu? --      Excl. in [place]? --     Constitutional: Alert and oriented. Well appearing and in no acute distress. ENT      Head: Normocephalic and atraumatic. Cardiovascular: Normal rate, regular rhythm. Grossly normal heart sounds.  Good peripheral circulation. Respiratory: Normal respiratory effort without tachypnea nor retractions. Breath sounds are clear and equal bilaterally. No wheezes, rales, rhonchi. Gastrointestinal: Soft and nontender.No CVA tenderness. Musculoskeletal:  No midline cervical, thoracic or lumbar tenderness to palpation.  Neurologic:  Normal speech and language. Speech is normal. No gait instability.  Skin:  Skin is warm, dry and intact. No rash noted. Psychiatric: Mood and affect are normal. Speech and behavior are normal. Patient exhibits appropriate insight and judgment   ___________________________________________   LABS (all labs ordered are listed, but only abnormal results are displayed)  Labs Reviewed  URINALYSIS, COMPLETE (UACMP) WITH MICROSCOPIC - Abnormal; Notable for the following components:      Result Value   Color, Urine RED (*)    APPearance CLOUDY (*)    Hgb urine dipstick LARGE (*)    Bilirubin Urine SMALL (*)    Protein, ur 100 (*)    Nitrite POSITIVE (*)    Leukocytes,Ua LARGE (*)    Bacteria, UA FEW (*)    All other components within normal limits  URINE CULTURE    PROCEDURES Procedures    INITIAL IMPRESSION / ASSESSMENT AND PLAN / ED COURSE  Pertinent labs & imaging results that were available during my care of the patient were  reviewed by me and considered in my medical decision making (see chart for details).  Well-appearing patient.  No acute distress.  Dysuria symptoms.  Urinalysis reviewed, consistent with UTI.  We will culture urine.  Will empirically start on oral Keflex.  Encourage rest, fluids, supportive care.Discussed indication, risks and benefits of medications with patient.  Discussed follow up with Primary care physician this week. Discussed follow up and return parameters including no resolution or any worsening concerns. Patient verbalized understanding and agreed to plan.   ____________________________________________   FINAL CLINICAL IMPRESSION(S) / ED DIAGNOSES  Final diagnoses:  Urinary tract infection with hematuria, site unspecified     ED Discharge Orders         Ordered    cephALEXin (  KEFLEX) 500 MG capsule  2 times daily     08/19/18 1853           Note: This dictation was prepared with Dragon dictation along with smaller phrase technology. Any transcriptional errors that result from this process are unintentional.         Marylene Land, NP 08/19/18 1909

## 2018-08-19 NOTE — ED Triage Notes (Signed)
Patient c/o increase in urinary urgency and burning when urinating that started yesterday.

## 2018-08-19 NOTE — Discharge Instructions (Signed)
Take medication as prescribed. Drink plenty of fluids.  ° °Follow up with your primary care physician this week as needed. Return to Urgent care for new or worsening concerns.  ° °

## 2018-08-21 LAB — URINE CULTURE: Culture: >=100000 — AB

## 2018-08-24 ENCOUNTER — Telehealth (HOSPITAL_COMMUNITY): Payer: Self-pay | Admitting: Emergency Medicine

## 2018-08-24 NOTE — Telephone Encounter (Signed)
Urine culture was positive for e coli and was given keflex at urgent care visit. Pt contacted and made aware, educated on completing antibiotic and to follow up if symptoms are persistent. Verbalized understanding.    

## 2018-10-07 ENCOUNTER — Other Ambulatory Visit: Payer: Self-pay

## 2018-10-07 DIAGNOSIS — E039 Hypothyroidism, unspecified: Secondary | ICD-10-CM

## 2018-10-10 LAB — TSH: TSH: 0.207 u[IU]/mL — ABNORMAL LOW (ref 0.450–4.500)

## 2018-10-12 ENCOUNTER — Encounter: Payer: Self-pay | Admitting: Family Medicine

## 2018-10-12 ENCOUNTER — Other Ambulatory Visit: Payer: Self-pay

## 2018-10-12 ENCOUNTER — Ambulatory Visit (INDEPENDENT_AMBULATORY_CARE_PROVIDER_SITE_OTHER): Payer: 59 | Admitting: Family Medicine

## 2018-10-12 VITALS — BP 130/88 | HR 80 | Ht 67.5 in | Wt 219.0 lb

## 2018-10-12 DIAGNOSIS — F419 Anxiety disorder, unspecified: Secondary | ICD-10-CM

## 2018-10-12 DIAGNOSIS — E039 Hypothyroidism, unspecified: Secondary | ICD-10-CM | POA: Diagnosis not present

## 2018-10-12 MED ORDER — LEVOTHYROXINE SODIUM 200 MCG PO TABS: ORAL_TABLET | ORAL | 2 refills | Status: DC

## 2018-10-12 MED ORDER — BUSPIRONE HCL 7.5 MG PO TABS: 7.5000 mg | ORAL_TABLET | Freq: Three times a day (TID) | ORAL | 5 refills | Status: DC

## 2018-10-12 MED ORDER — LEVOTHYROXINE SODIUM 25 MCG PO TABS: 25.0000 ug | ORAL_TABLET | Freq: Every day | ORAL | 1 refills | Status: DC

## 2018-10-12 MED ORDER — LEVOTHYROXINE SODIUM 25 MCG PO TABS: 25.0000 ug | ORAL_TABLET | Freq: Every day | ORAL | 2 refills | Status: DC

## 2018-10-12 MED ORDER — LEVOTHYROXINE SODIUM 200 MCG PO TABS: ORAL_TABLET | ORAL | 1 refills | Status: DC

## 2018-10-12 NOTE — Patient Instructions (Signed)
Mediterranean Diet  A Mediterranean diet refers to food and lifestyle choices that are based on the traditions of countries located on the [place]. This way of eating has been shown to help prevent certain conditions and improve outcomes for people who have chronic diseases, like kidney disease and heart disease.  What are tips for following this plan?  Lifestyle   Cook and eat meals together with your family, when possible.   Drink enough fluid to keep your urine clear or pale yellow.   Be physically active every day. This includes:  ? Aerobic exercise like running or swimming.  ? Leisure activities like gardening, walking, or housework.   Get 7-8 hours of sleep each night.   If recommended by your health care provider, drink red wine in moderation. This means 1 glass a day for nonpregnant women and 2 glasses a day for men. A glass of wine equals 5 oz (150 mL).  Reading food labels     Check the serving size of packaged foods. For foods such as rice and pasta, the serving size refers to the amount of cooked product, not dry.   Check the total fat in packaged foods. Avoid foods that have saturated fat or trans fats.   Check the ingredients list for added sugars, such as corn syrup.  Shopping   At the grocery store, buy most of your food from the areas near the walls of the store. This includes:  ? Fresh fruits and vegetables (produce).  ? Grains, beans, nuts, and seeds. Some of these may be available in unpackaged forms or large amounts (in bulk).  ? Fresh seafood.  ? Poultry and eggs.  ? Low-fat dairy products.   Buy whole ingredients instead of prepackaged foods.   Buy fresh fruits and vegetables in-season from local farmers markets.   Buy frozen fruits and vegetables in resealable bags.   If you do not have access to quality fresh seafood, buy precooked frozen shrimp or canned fish, such as tuna, salmon, or sardines.   Buy small amounts of raw or cooked vegetables, salads, or olives from  the deli or salad bar at your store.   Stock your pantry so you always have certain foods on hand, such as olive oil, canned tuna, canned tomatoes, rice, pasta, and beans.  Cooking   Cook foods with extra-virgin olive oil instead of using butter or other vegetable oils.   Have meat as a side dish, and have vegetables or grains as your main dish. This means having meat in small portions or adding small amounts of meat to foods like pasta or stew.   Use beans or vegetables instead of meat in common dishes like chili or lasagna.   Experiment with different cooking methods. Try roasting or broiling vegetables instead of steaming or sauteing them.   Add frozen vegetables to soups, stews, pasta, or rice.   Add nuts or seeds for added healthy fat at each meal. You can add these to yogurt, salads, or vegetable dishes.   Marinate fish or vegetables using olive oil, lemon juice, garlic, and fresh herbs.  Meal planning     Plan to eat 1 vegetarian meal one day each week. Try to work up to 2 vegetarian meals, if possible.   Eat seafood 2 or more times a week.   Have healthy snacks readily available, such as:  ? Vegetable sticks with hummus.  ? Greek yogurt.  ? Fruit and nut trail mix.   Eat balanced   meals throughout the week. This includes:  ? Fruit: 2-3 servings a day  ? Vegetables: 4-5 servings a day  ? Low-fat dairy: 2 servings a day  ? Fish, poultry, or lean meat: 1 serving a day  ? Beans and legumes: 2 or more servings a week  ? Nuts and seeds: 1-2 servings a day  ? Whole grains: 6-8 servings a day  ? Extra-virgin olive oil: 3-4 servings a day   Limit red meat and sweets to only a few servings a month  What are my food choices?   Mediterranean diet  ? Recommended  ? Grains: Whole-grain pasta. Brown rice. Bulgar wheat. Polenta. Couscous. Whole-wheat bread. Oatmeal. Quinoa.  ? Vegetables: Artichokes. Beets. Broccoli. Cabbage. Carrots. Eggplant. Green beans. Chard. Kale. Spinach. Onions. Leeks. Peas. Squash.  Tomatoes. Peppers. Radishes.  ? Fruits: Apples. Apricots. Avocado. Berries. Bananas. Cherries. Dates. Figs. Grapes. Lemons. Melon. Oranges. Peaches. Plums. Pomegranate.  ? Meats and other protein foods: Beans. Almonds. Sunflower seeds. Pine nuts. Peanuts. Cod. Salmon. Scallops. Shrimp. Tuna. Tilapia. Clams. Oysters. Eggs.  ? Dairy: Low-fat milk. Cheese. Greek yogurt.  ? Beverages: Water. Red wine. Herbal tea.  ? Fats and oils: Extra virgin olive oil. Avocado oil. Grape seed oil.  ? Sweets and desserts: Greek yogurt with honey. Baked apples. Poached pears. Trail mix.  ? Seasoning and other foods: Basil. Cilantro. Coriander. Cumin. Mint. Parsley. Sage. Rosemary. Tarragon. Garlic. Oregano. Thyme. Pepper. Balsalmic vinegar. Tahini. Hummus. Tomato sauce. Olives. Mushrooms.  ? Limit these  ? Grains: Prepackaged pasta or rice dishes. Prepackaged cereal with added sugar.  ? Vegetables: Deep fried potatoes (french fries).  ? Fruits: Fruit canned in syrup.  ? Meats and other protein foods: Beef. Pork. Lamb. Poultry with skin. Hot dogs. Bacon.  ? Dairy: Ice cream. Sour cream. Whole milk.  ? Beverages: Juice. Sugar-sweetened soft drinks. Beer. Liquor and spirits.  ? Fats and oils: Butter. Canola oil. Vegetable oil. Beef fat (tallow). Lard.  ? Sweets and desserts: Cookies. Cakes. Pies. Candy.  ? Seasoning and other foods: Mayonnaise. Premade sauces and marinades.  ? The items listed may not be a complete list. Talk with your dietitian about what dietary choices are right for you.  Summary   The Mediterranean diet includes both food and lifestyle choices.   Eat a variety of fresh fruits and vegetables, beans, nuts, seeds, and whole grains.   Limit the amount of red meat and sweets that you eat.   Talk with your health care provider about whether it is safe for you to drink red wine in moderation. This means 1 glass a day for nonpregnant women and 2 glasses a day for men. A glass of wine equals 5 oz (150 mL).  This information  is not intended to replace advice given to you by your health care provider. Make sure you discuss any questions you have with your health care provider.  Document Released: 12/21/2015 Document Revised: 01/23/2016 Document Reviewed: 12/21/2015  Elsevier Interactive Patient Education  2019 [employer]

## 2018-10-12 NOTE — Progress Notes (Signed)
Date:  10/12/2018   Name:  Karen Franco   DOB:  Apr 27, 1961   MRN:  [id number]   Chief Complaint: Hypothyroidism (not tired, no muscle jumping) and Anxiety ( GAD7=0 wants a refill on clonapin/ or something else)  Anxiety  Presents for follow-up visit. Symptoms include excessive worry and nervous/anxious behavior. Patient reports no chest pain, compulsions, confusion, decreased concentration, depressed mood, dizziness, dry mouth, feeling of choking, hyperventilation, impotence, insomnia, irritability, malaise, muscle tension, nausea, obsessions, palpitations, panic, restlessness, shortness of breath or suicidal ideas. Symptoms occur constantly. The severity of symptoms is mild. The quality of sleep is good.    Thyroid Problem  Presents for follow-up visit. Symptoms include anxiety. Patient reports no cold intolerance, constipation, depressed mood, diaphoresis, diarrhea, dry skin, fatigue, hair loss, heat intolerance, hoarse voice, leg swelling, menstrual problem, nail problem, palpitations, tremors, visual change, weight gain or weight loss. The symptoms have been stable.    Review of Systems  Constitutional: Negative for diaphoresis, fatigue, irritability, weight gain and weight loss.  HENT: Negative for hoarse voice.   Respiratory: Negative for shortness of breath.   Cardiovascular: Negative for chest pain and palpitations.  Gastrointestinal: Negative for constipation, diarrhea and nausea.  Endocrine: Negative for cold intolerance and heat intolerance.  Genitourinary: Negative for impotence and menstrual problem.  Neurological: Negative for dizziness and tremors.  Psychiatric/Behavioral: Negative for confusion, decreased concentration and suicidal ideas. The patient is nervous/anxious. The patient does not have insomnia.     Patient Active Problem List   Diagnosis Date Noted  . Special screening for malignant neoplasms, colon   . Benign neoplasm of ascending colon   . Acquired  hypothyroidism 12/07/2014  . Taking medication for chronic disease 12/07/2014    No Known Allergies  Past Surgical History:  Procedure Laterality Date  . APPENDECTOMY    . BREAST CYST ASPIRATION     not sure which side-neg  . CESAREAN SECTION     x 2  . COLONOSCOPY WITH PROPOFOL N/A 01/20/2015   Procedure: COLONOSCOPY WITH PROPOFOL;  Surgeon: Lucilla Lame, MD;  Location: [place];  Service: Endoscopy;  Laterality: N/A;  . MOHS SURGERY  05/2015   L) ear  . POLYPECTOMY  01/20/2015   Procedure: POLYPECTOMY;  Surgeon: Lucilla Lame, MD;  Location: [place];  Service: Endoscopy;;  . THYROIDECTOMY    . VAGINAL HYSTERECTOMY      Social History   Tobacco Use  . Smoking status: Never Smoker  . Smokeless tobacco: Never Used  Substance Use Topics  . Alcohol use: Yes    Alcohol/week: 0.0 standard drinks    Comment: 1 glass wine/month  . Drug use: No     Medication list has been reviewed and updated.  Current Meds  Medication Sig  . Calcium Carbonate-Vitamin D (CALCIUM-VITAMIN D) 500-200 MG-UNIT per tablet Take 1 tablet by mouth daily.  . cholecalciferol (VITAMIN D) 1000 units tablet Take 1,000 Units by mouth daily.  . clonazePAM (KLONOPIN) 0.5 MG tablet Take 1 tablet (0.5 mg total) by mouth daily. #30 per 6 months (Patient taking differently: Take 0.25 mg by mouth daily. #30 per 6 months)  . levothyroxine (SYNTHROID, LEVOTHROID) 200 MCG tablet TAKE 1 TABLET BY MOUTH DAILY BEFORE BREAKFAST. TAKE IN PLACE OF 175 MCG  . levothyroxine (SYNTHROID, LEVOTHROID) 25 MCG tablet Take 1 tablet (25 mcg total) by mouth daily before breakfast. Add to 274mcg= 235mcg daily due to labs  . Multiple Vitamin (MULTIVITAMIN) capsule Take 1 capsule by  mouth daily.  . vitamin B-12 (CYANOCOBALAMIN) 1000 MCG tablet Take 1,000 mcg by mouth daily.    PHQ 2/9 Scores 01/23/2018 10/03/2017 11/07/2015 06/09/2015  PHQ - 2 Score 0 0 0 0  PHQ- 9 Score - 0 - -    BP Readings from Last 3 Encounters:   10/12/18 130/88  08/19/18 129/77  06/12/18 120/80    Physical Exam  Wt Readings from Last 3 Encounters:  10/12/18 219 lb (99.3 kg)  08/19/18 212 lb (96.2 kg)  06/12/18 213 lb (96.6 kg)    BP 130/88   Pulse 80   Ht 5' 7.5" (1.715 m)   Wt 219 lb (99.3 kg)   BMI 33.79 kg/m   Assessment and Plan:  1. Acquired hypothyroidism Patient has had fluctuating TSH levels.  This is been treated in the past as well as present.  The patient states she feels very very well at the current dosing of 225 and therefore we will continue this dosing for the next 6 months. - levothyroxine (SYNTHROID) 25 MCG tablet; Take 1 tablet (25 mcg total) by mouth daily before breakfast. Add to 273mcg= 29mcg daily due to labs  Dispense: 90 tablet; Refill: 1 - levothyroxine (SYNTHROID) 200 MCG tablet; TAKE 1 TABLET BY MOUTH DAILY BEFORE BREAKFAST. TAKE IN PLACE OF 175 MCG  Dispense: 90 tablet; Refill: 1  2. Anxiety Patient has been on Klonopin for the last 6 months for which we did not intend to remain.  Patient continues to have anxiety particularly with the COVID situation and the stress at work and would like to continue something.  Therefore we will begin buspirone 7.5 twice a day will recheck in 6 months. - busPIRone (BUSPAR) 7.5 MG tablet; Take 1 tablet (7.5 mg total) by mouth 3 (three) times daily.  Dispense: 60 tablet; Refill: 5

## 2019-03-01 ENCOUNTER — Observation Stay
Admission: EM | Admit: 2019-03-01 | Discharge: 2019-03-02 | Disposition: A | Discharge status: Home / Self Care | Payer: 59 | Attending: General Surgery | Admitting: General Surgery

## 2019-03-01 ENCOUNTER — Emergency Department: Payer: 59

## 2019-03-01 ENCOUNTER — Inpatient Hospital Stay: Payer: 59 | Admitting: Certified Registered"

## 2019-03-01 ENCOUNTER — Other Ambulatory Visit: Payer: Self-pay

## 2019-03-01 ENCOUNTER — Encounter: Payer: Self-pay | Admitting: Emergency Medicine

## 2019-03-01 ENCOUNTER — Encounter
Admission: EM | Disposition: A | Discharge status: Home / Self Care | Payer: Self-pay | Source: Home / Self Care | Attending: Emergency Medicine

## 2019-03-01 DIAGNOSIS — Z85820 Personal history of malignant melanoma of skin: Secondary | ICD-10-CM | POA: Insufficient documentation

## 2019-03-01 DIAGNOSIS — Z79890 Hormone replacement therapy (postmenopausal): Secondary | ICD-10-CM | POA: Diagnosis not present

## 2019-03-01 DIAGNOSIS — R569 Unspecified convulsions: Secondary | ICD-10-CM | POA: Insufficient documentation

## 2019-03-01 DIAGNOSIS — Z9049 Acquired absence of other specified parts of digestive tract: Secondary | ICD-10-CM

## 2019-03-01 DIAGNOSIS — K819 Cholecystitis, unspecified: Secondary | ICD-10-CM

## 2019-03-01 DIAGNOSIS — Z20828 Contact with and (suspected) exposure to other viral communicable diseases: Secondary | ICD-10-CM | POA: Diagnosis not present

## 2019-03-01 DIAGNOSIS — E890 Postprocedural hypothyroidism: Secondary | ICD-10-CM | POA: Insufficient documentation

## 2019-03-01 DIAGNOSIS — K810 Acute cholecystitis: Secondary | ICD-10-CM | POA: Diagnosis not present

## 2019-03-01 DIAGNOSIS — K8000 Calculus of gallbladder with acute cholecystitis without obstruction: Principal | ICD-10-CM | POA: Insufficient documentation

## 2019-03-01 HISTORY — PX: CHOLECYSTECTOMY: SHX55

## 2019-03-01 LAB — COMPREHENSIVE METABOLIC PANEL
ALT: 18 U/L (ref 0–44)
AST: 19 U/L (ref 15–41)
Albumin: 3.7 g/dL (ref 3.5–5.0)
Alkaline Phosphatase: 90 U/L (ref 38–126)
Anion gap: 7 (ref 5–15)
BUN: 15 mg/dL (ref 6–20)
CO2: 27 mmol/L (ref 22–32)
Calcium: 9.1 mg/dL (ref 8.9–10.3)
Chloride: 104 mmol/L (ref 98–111)
Creatinine, Ser: 0.56 mg/dL (ref 0.44–1.00)
GFR calc Af Amer: >60 mL/min (ref >60)
GFR calc non Af Amer: >60 mL/min (ref >60)
Glucose, Bld: 110 mg/dL — ABNORMAL HIGH (ref 70–99)
Potassium: 4.4 mmol/L (ref 3.5–5.1)
Sodium: 138 mmol/L (ref 135–145)
Total Bilirubin: 0.5 mg/dL (ref 0.3–1.2)
Total Protein: 7.1 g/dL (ref 6.5–8.1)

## 2019-03-01 LAB — CBC
HCT: 42.8 % (ref 36.0–46.0)
Hemoglobin: 14 g/dL (ref 12.0–15.0)
MCH: 28.5 pg (ref 26.0–34.0)
MCHC: 32.7 g/dL (ref 30.0–36.0)
MCV: 87.2 fL (ref 80.0–100.0)
Platelets: 397 10*3/uL (ref 150–400)
RBC: 4.91 MIL/uL (ref 3.87–5.11)
RDW: 13.6 % (ref 11.5–15.5)
WBC: 12.7 10*3/uL — ABNORMAL HIGH (ref 4.0–10.5)
nRBC: 0 % (ref 0.0–0.2)

## 2019-03-01 LAB — SARS CORONAVIRUS 2 BY RT PCR (HOSPITAL ORDER, PERFORMED IN ~~LOC~~ HOSPITAL LAB): SARS Coronavirus 2: NEGATIVE

## 2019-03-01 LAB — LIPASE, BLOOD: Lipase: 22 U/L (ref 11–51)

## 2019-03-01 SURGERY — LAPAROSCOPIC CHOLECYSTECTOMY
Anesthesia: General

## 2019-03-01 MED ORDER — LEVOTHYROXINE SODIUM 50 MCG PO TABS
25.0000 ug | ORAL_TABLET | Freq: Every day | ORAL | Status: DC
  Administered 2019-03-02: 25 ug via ORAL
  Filled 2019-03-01: qty 1

## 2019-03-01 MED ORDER — HYDROMORPHONE HCL 1 MG/ML IJ SOLN: 0.5000 mg | INTRAMUSCULAR | Status: DC | PRN

## 2019-03-01 MED ORDER — IOHEXOL 300 MG/ML  SOLN
100.0000 mL | Freq: Once | INTRAMUSCULAR | Status: AC | PRN
  Administered 2019-03-01: 100 mL via INTRAVENOUS

## 2019-03-01 MED ORDER — ONDANSETRON 4 MG PO TBDP: 4.0000 mg | ORAL_TABLET | Freq: Four times a day (QID) | ORAL | Status: DC | PRN

## 2019-03-01 MED ORDER — DEXAMETHASONE SODIUM PHOSPHATE 10 MG/ML IJ SOLN
INTRAMUSCULAR | Status: DC | PRN
  Administered 2019-03-01: 10 mg via INTRAVENOUS

## 2019-03-01 MED ORDER — EPHEDRINE SULFATE 50 MG/ML IJ SOLN
INTRAMUSCULAR | Status: DC | PRN
  Administered 2019-03-01: 5 mg via INTRAVENOUS

## 2019-03-01 MED ORDER — LACTATED RINGERS IV SOLN
INTRAVENOUS | Status: DC | PRN
  Administered 2019-03-01 (×2): via INTRAVENOUS

## 2019-03-01 MED ORDER — ONDANSETRON HCL 4 MG/2ML IJ SOLN
4.0000 mg | Freq: Once | INTRAMUSCULAR | Status: AC
  Administered 2019-03-01: 4 mg via INTRAVENOUS
  Filled 2019-03-01: qty 2

## 2019-03-01 MED ORDER — MORPHINE SULFATE (PF) 4 MG/ML IV SOLN
4.0000 mg | Freq: Once | INTRAVENOUS | Status: AC
  Administered 2019-03-01: 4 mg via INTRAVENOUS
  Filled 2019-03-01: qty 1

## 2019-03-01 MED ORDER — NAPHAZOLINE-GLYCERIN 0.012-0.2 % OP SOLN
1.0000 [drp] | Freq: Four times a day (QID) | OPHTHALMIC | Status: DC | PRN
  Administered 2019-03-01: 2 [drp] via OPHTHALMIC
  Filled 2019-03-01: qty 15

## 2019-03-01 MED ORDER — IOHEXOL 9 MG/ML PO SOLN
1000.0000 mL | Freq: Once | ORAL | Status: AC
  Administered 2019-03-01: 1000 mL via ORAL

## 2019-03-01 MED ORDER — FENTANYL CITRATE (PF) 100 MCG/2ML IJ SOLN
25.0000 ug | INTRAMUSCULAR | Status: DC | PRN
  Administered 2019-03-01 (×2): 50 ug via INTRAVENOUS

## 2019-03-01 MED ORDER — MORPHINE SULFATE (PF) 2 MG/ML IV SOLN: 2.0000 mg | INTRAVENOUS | Status: DC | PRN

## 2019-03-01 MED ORDER — PIPERACILLIN-TAZOBACTAM 3.375 G IVPB
3.3750 g | Freq: Three times a day (TID) | INTRAVENOUS | Status: DC
  Administered 2019-03-01: 3.375 g via INTRAVENOUS
  Filled 2019-03-01: qty 50

## 2019-03-01 MED ORDER — ROCURONIUM BROMIDE 100 MG/10ML IV SOLN
INTRAVENOUS | Status: DC | PRN
  Administered 2019-03-01: 10 mg via INTRAVENOUS
  Administered 2019-03-01: 4 mg via INTRAVENOUS

## 2019-03-01 MED ORDER — DEXMEDETOMIDINE HCL IN NACL 200 MCG/50ML IV SOLN
INTRAVENOUS | Status: DC | PRN
  Administered 2019-03-01: 8 ug via INTRAVENOUS

## 2019-03-01 MED ORDER — FENTANYL CITRATE (PF) 100 MCG/2ML IJ SOLN
50.0000 ug | INTRAMUSCULAR | Status: DC | PRN
  Administered 2019-03-01: 50 ug via INTRAVENOUS
  Filled 2019-03-01: qty 2

## 2019-03-01 MED ORDER — GLYCOPYRROLATE 0.2 MG/ML IJ SOLN
INTRAMUSCULAR | Status: DC | PRN
  Administered 2019-03-01: 0.2 mg via INTRAVENOUS

## 2019-03-01 MED ORDER — LIDOCAINE-EPINEPHRINE 1 %-1:100000 IJ SOLN
INTRAMUSCULAR | Status: AC
  Filled 2019-03-01: qty 2

## 2019-03-01 MED ORDER — SUGAMMADEX SODIUM 500 MG/5ML IV SOLN
INTRAVENOUS | Status: DC | PRN
  Administered 2019-03-01: 380 mg via INTRAVENOUS

## 2019-03-01 MED ORDER — ACETAMINOPHEN 500 MG PO TABS
1000.0000 mg | ORAL_TABLET | Freq: Four times a day (QID) | ORAL | Status: DC
  Administered 2019-03-01 – 2019-03-02 (×4): 1000 mg via ORAL
  Administered 2019-03-02: 500 mg via ORAL
  Filled 2019-03-01 (×5): qty 2

## 2019-03-01 MED ORDER — EVICEL 5 ML EX KIT
PACK | CUTANEOUS | Status: DC | PRN
  Administered 2019-03-01: 1

## 2019-03-01 MED ORDER — SUCCINYLCHOLINE CHLORIDE 20 MG/ML IJ SOLN
INTRAMUSCULAR | Status: DC | PRN
  Administered 2019-03-01: 100 mg via INTRAVENOUS

## 2019-03-01 MED ORDER — BUPIVACAINE HCL (PF) 0.25 % IJ SOLN
INTRAMUSCULAR | Status: AC
  Filled 2019-03-01: qty 30

## 2019-03-01 MED ORDER — LIDOCAINE-EPINEPHRINE 1 %-1:100000 IJ SOLN
INTRAMUSCULAR | Status: DC | PRN
  Administered 2019-03-01: 25 mL via INTRAMUSCULAR

## 2019-03-01 MED ORDER — PROPOFOL 10 MG/ML IV BOLUS
INTRAVENOUS | Status: DC | PRN
  Administered 2019-03-01: 200 mg via INTRAVENOUS

## 2019-03-01 MED ORDER — FENTANYL CITRATE (PF) 100 MCG/2ML IJ SOLN
INTRAMUSCULAR | Status: AC
  Filled 2019-03-01: qty 2

## 2019-03-01 MED ORDER — HYDROCODONE-ACETAMINOPHEN 5-325 MG PO TABS
1.0000 | ORAL_TABLET | ORAL | Status: DC | PRN
  Administered 2019-03-01 – 2019-03-02 (×2): 1 via ORAL
  Filled 2019-03-01 (×2): qty 1

## 2019-03-01 MED ORDER — MIDAZOLAM HCL 2 MG/2ML IJ SOLN
INTRAMUSCULAR | Status: AC
  Filled 2019-03-01: qty 2

## 2019-03-01 MED ORDER — PHENYLEPHRINE HCL (PRESSORS) 10 MG/ML IV SOLN
INTRAVENOUS | Status: DC | PRN
  Administered 2019-03-01: 100 ug via INTRAVENOUS

## 2019-03-01 MED ORDER — LEVOTHYROXINE SODIUM 100 MCG PO TABS
200.0000 ug | ORAL_TABLET | Freq: Every day | ORAL | Status: DC
  Administered 2019-03-02: 200 ug via ORAL
  Filled 2019-03-01: qty 2

## 2019-03-01 MED ORDER — MIDAZOLAM HCL 2 MG/2ML IJ SOLN
INTRAMUSCULAR | Status: DC | PRN
  Administered 2019-03-01: 2 mg via INTRAVENOUS

## 2019-03-01 MED ORDER — ONDANSETRON HCL 4 MG/2ML IJ SOLN
4.0000 mg | Freq: Four times a day (QID) | INTRAMUSCULAR | Status: DC | PRN
  Administered 2019-03-01: 4 mg via INTRAVENOUS

## 2019-03-01 MED ORDER — LIDOCAINE HCL (CARDIAC) PF 100 MG/5ML IV SOSY
PREFILLED_SYRINGE | INTRAVENOUS | Status: DC | PRN
  Administered 2019-03-01: 100 mg via INTRAVENOUS

## 2019-03-01 MED ORDER — SODIUM CHLORIDE 0.9 % IV SOLN
INTRAVENOUS | Status: DC
  Administered 2019-03-01 – 2019-03-02 (×3): via INTRAVENOUS

## 2019-03-01 MED ORDER — SODIUM CHLORIDE 0.9 % IV SOLN
INTRAVENOUS | Status: DC | PRN
  Administered 2019-03-01: 25 ug/min via INTRAVENOUS

## 2019-03-01 MED ORDER — PROMETHAZINE HCL 25 MG/ML IJ SOLN: 6.2500 mg | INTRAMUSCULAR | Status: DC | PRN

## 2019-03-01 MED ORDER — FENTANYL CITRATE (PF) 100 MCG/2ML IJ SOLN
INTRAMUSCULAR | Status: DC | PRN
  Administered 2019-03-01: 100 ug via INTRAVENOUS

## 2019-03-01 MED ORDER — FENTANYL CITRATE (PF) 100 MCG/2ML IJ SOLN
INTRAMUSCULAR | Status: AC
  Administered 2019-03-01: 50 ug via INTRAVENOUS
  Filled 2019-03-01: qty 2

## 2019-03-01 SURGICAL SUPPLY — 47 items
APPLIER CLIP 5 13 M/L LIGAMAX5 (MISCELLANEOUS) ×2
BLADE SURG SZ11 CARB STEEL (BLADE) ×2 IMPLANT
CANISTER SUCT 1200ML W/VALVE (MISCELLANEOUS) ×2 IMPLANT
CHLORAPREP W/TINT 26 (MISCELLANEOUS) ×2 IMPLANT
CLIP APPLIE 5 13 M/L LIGAMAX5 (MISCELLANEOUS) ×1 IMPLANT
COVER WAND RF STERILE (DRAPES) ×2 IMPLANT
DECANTER SPIKE VIAL GLASS SM (MISCELLANEOUS) ×4 IMPLANT
DEFOGGER SCOPE WARMER CLEARIFY (MISCELLANEOUS) ×2 IMPLANT
DERMABOND ADVANCED (GAUZE/BANDAGES/DRESSINGS) ×1
DERMABOND ADVANCED .7 DNX12 (GAUZE/BANDAGES/DRESSINGS) ×1 IMPLANT
ELECT CAUTERY BLADE TIP 2.5 (TIP) ×2
ELECT REM PT RETURN 9FT ADLT (ELECTROSURGICAL) ×2
ELECTRODE CAUTERY BLDE TIP 2.5 (TIP) ×1 IMPLANT
ELECTRODE REM PT RTRN 9FT ADLT (ELECTROSURGICAL) ×1 IMPLANT
GLOVE BIO SURGEON STRL SZ 6.5 (GLOVE) ×2 IMPLANT
GLOVE INDICATOR 7.0 STRL GRN (GLOVE) ×4 IMPLANT
GOWN STRL REUS W/ TWL LRG LVL3 (GOWN DISPOSABLE) ×3 IMPLANT
GOWN STRL REUS W/TWL LRG LVL3 (GOWN DISPOSABLE) ×3
GRASPER SUT TROCAR 14GX15 (MISCELLANEOUS) IMPLANT
HEMOSTAT SURGICEL 2X3 (HEMOSTASIS) ×2 IMPLANT
IRRIGATION STRYKERFLOW (MISCELLANEOUS) ×1 IMPLANT
IRRIGATOR STRYKERFLOW (MISCELLANEOUS) ×2
IV NS 1000ML (IV SOLUTION) ×1
IV NS 1000ML BAXH (IV SOLUTION) ×1 IMPLANT
KIT TURNOVER KIT A (KITS) ×2 IMPLANT
LABEL OR SOLS (LABEL) ×2 IMPLANT
NEEDLE HYPO 22GX1.5 SAFETY (NEEDLE) ×2 IMPLANT
NS IRRIG 500ML POUR BTL (IV SOLUTION) ×2 IMPLANT
PACK LAP CHOLECYSTECTOMY (MISCELLANEOUS) ×2 IMPLANT
PENCIL ELECTRO HAND CTR (MISCELLANEOUS) ×2 IMPLANT
POUCH SPECIMEN RETRIEVAL 10MM (ENDOMECHANICALS) ×2 IMPLANT
SCISSORS METZENBAUM CVD 33 (INSTRUMENTS) ×2 IMPLANT
SET TUBE SMOKE EVAC HIGH FLOW (TUBING) ×2 IMPLANT
SLEEVE ADV FIXATION 5X100MM (TROCAR) ×4 IMPLANT
SOLUTION ELECTROLUBE (MISCELLANEOUS) IMPLANT
STRIP CLOSURE SKIN 1/2X4 (GAUZE/BANDAGES/DRESSINGS) ×2 IMPLANT
SUT MNCRL 4-0 (SUTURE) ×1
SUT MNCRL 4-0 27XMFL (SUTURE) ×1
SUT VIC AB 3-0 SH 27 (SUTURE) ×1
SUT VIC AB 3-0 SH 27X BRD (SUTURE) ×1 IMPLANT
SUT VICRYL 0 AB UR-6 (SUTURE) ×4 IMPLANT
SUTURE MNCRL 4-0 27XMF (SUTURE) ×1 IMPLANT
TIP RIGID 35CM EVICEL (HEMOSTASIS) ×2 IMPLANT
TROCAR ADV FIXATION 12X100MM (TROCAR) ×4 IMPLANT
TROCAR BALLN GELPORT 12X130M (ENDOMECHANICALS) IMPLANT
TROCAR Z-THREAD OPTICAL 5X100M (TROCAR) ×2 IMPLANT
WATER STERILE IRR 1000ML POUR (IV SOLUTION) ×2 IMPLANT

## 2019-03-01 NOTE — Plan of Care (Signed)

## 2019-03-01 NOTE — Transfer of Care (Signed)
[-  Immediate Anesthesia Transfer of Care Note  Patient: Karen Franco  Procedure(s) Performed: LAPAROSCOPIC CHOLECYSTECTOMY (N/A )  Patient Location: PACU  Anesthesia Type:General  Level of Consciousness: awake, alert , oriented and patient cooperative  Airway & Oxygen Therapy: Patient Spontanous Breathing and Patient connected to face mask oxygen  Post-op Assessment: Report given to RN and Post -op Vital signs reviewed and stable  Post vital signs: Reviewed and stable  Last Vitals:  Vitals Value Taken Time  BP 127/74 03/01/19 1615  Temp 37.1 C 03/01/19 1614  Pulse 105 03/01/19 1619  Resp 18 03/01/19 1619  SpO2 99 % 03/01/19 1619  Vitals shown include unvalidated device data.  Last Pain:  Vitals:   03/01/19 1343  TempSrc: Tympanic  PainSc:          Complications: No apparent anesthesia complications

## 2019-03-01 NOTE — ED Notes (Signed)
US at bedside

## 2019-03-01 NOTE — Op Note (Signed)
Laparoscopic Cholecystectomy  Pre-operative Diagnosis: Acute calculus cholecystitis  Post-operative Diagnosis: Same  Procedure: Laparoscopic cholecystectomy  Surgeon: Fredirick Maudlin, MD  Anesthesia: GETA  Assistant: None   Findings: The gallbladder was distended and inflamed, consistent with acute cholecystitis.  There was a large stone wedged in the neck of the gallbladder.  The gallbladder was partially intrahepatic.  Estimated Blood Loss: 25 cc         Drains: None         Specimens: Gallbladder           Complications: none   Procedure Details  The patient was seen again in the preoperative holding area. The benefits, complications, treatment options, and expected outcomes were discussed with the patient. The risks of bleeding, infection, recurrence of symptoms, failure to resolve symptoms, bile duct damage, bile duct leak, retained common bile duct stone, bowel injury, any of which could require further surgery and/or ERCP, stent, or papillotomy were reviewed with the patient. The likelihood of improving the patient's symptoms with return to their baseline status is good.  The patient and/or family concurred with the proposed plan, giving informed consent.  The patient was taken to operating room, identified as Karen Franco and the procedure verified as Laparoscopic Cholecystectomy. A time out was performed and the above information confirmed.  Prior to the induction of general anesthesia, antibiotic prophylaxis was administered. VTE prophylaxis was in place. General endotracheal anesthesia was then administered and tolerated well. After the induction, the abdomen was prepped with Chloraprep and draped in the sterile fashion. The patient was positioned in the supine position.  Optiview technique was used to enter the abdominal cavity using a 10 mm 0 degree scope, as there was not a sterile 5 mm 0 degree scope.  Pneumoperitoneum was then created with CO2 and tolerated well  without any adverse changes in the patient's vital signs.  A 12 mm periumbilical port and 2 additional 5-mm ports were placed in the right upper quadrant all under direct vision. All skin incisions  were infiltrated with a local anesthetic agent before making the incision and placing the trocars.   The patient was positioned  in reverse Trendelenburg, tilted slightly to the patient's left.  The gallbladder was identified.it was clearly inflamed and tense.  In order to grasp the fundus, approximately 25 cc of bile were aspirated with a laparoscopic needle aspirator.  The fundus was then grasped and retracted cephalad. Adhesions were lysed bluntly. The infundibulum was grasped, however this was quite difficult secondary to the presence of a stone.  Once we were able to grasp it, it was retracted laterally, exposing the peritoneum overlying the triangle of Calot. This was then divided and exposed in a blunt fashion. An extended critical view of the cystic duct and cystic artery was obtained.  The cystic duct was clearly identified and bluntly dissected free. Both the cystic artery and duct were double clipped and divided.  The gallbladder was taken from the gallbladder fossa in a retrograde fashion with the electrocautery.  A small vessel within the liver bed was encountered.  It was clipped and hemostasis was obtained in that manner.  The gallbladder was then removed and placed in an Endo pouch bag. The liver bed was irrigated and inspected. Hemostasis was achieved with the electrocautery and with use of Surgicel. Copious saline irrigation was utilized and was repeatedly aspirated until clear.  The gallbladder and Endo pouch sac were then removed through a port site. Evicel was applied to the  liver bed.  Inspection of the right upper quadrant was performed. No bleeding, bile duct injury or leak, or bowel injury was noted. Pneumoperitoneum was released.  The periumbilical port site was closed with interrumpted 0  Vicryl sutures, as was the right upper quadrant port site through which the gallbladder was removed.  4-0 subcuticular Monocryl was used to close the skin. Dermabond and Steri-Strips were applied.  The patient was then extubated and brought to the recovery room in stable condition. Sponge, lap, and needle counts were correct at closure and at the conclusion of the case.               Fredirick Maudlin, MD, FACS

## 2019-03-01 NOTE — ED Notes (Signed)
Patient transported to CT 

## 2019-03-01 NOTE — ED Notes (Signed)
Pt back from CT

## 2019-03-01 NOTE — ED Notes (Signed)
Called Karen Franco to coordinate transport of Pt.

## 2019-03-01 NOTE — Anesthesia Procedure Notes (Signed)
Procedure Name: Intubation Performed by: Kelton Pillar, CRNA Pre-anesthesia Checklist: Patient identified, Emergency Drugs available, Suction available and Patient being monitored Patient Re-evaluated:Patient Re-evaluated prior to induction Oxygen Delivery Method: Circle system utilized and Supernova nasal CPAP Induction Type: Rapid sequence and Cricoid Pressure applied Laryngoscope Size: McGraph and 3 Grade View: Grade I Tube type: Oral Tube size: 7.0 mm Number of attempts: 1 Airway Equipment and Method: Stylet Placement Confirmation: ETT inserted through vocal cords under direct vision,  positive ETCO2,  breath sounds checked- equal and bilateral and CO2 detector Secured at: 21 cm Tube secured with: Tape Dental Injury: Teeth and Oropharynx as per pre-operative assessment

## 2019-03-01 NOTE — Anesthesia Post-op Follow-up Note (Signed)
Anesthesia QCDR form completed.        

## 2019-03-01 NOTE — ED Provider Notes (Signed)
[facility] Emergency Department Provider Note  Time seen: 7:15 AM  I have reviewed the triage vital signs and the nursing notes.   HISTORY  Chief Complaint Abdominal Pain   HPI Karen Franco is a 58 y.o. female with a past medical history of epilepsy, presents to the emergency department for abdominal pain.  According to the patient several days ago she developed upper abdominal pain which alleviated after several hours any large loose bowel movement.  Last night around 7 PM she developed upper abdominal pain once again which has progressively worsened throughout the night without relieving.  Patient denies any significant bowel movement overnight.  States mild nausea but denies any vomiting.  Denies any fever cough or shortness of breath.  Describes her abdominal pain is moderate aching type pain mostly across the upper abdomen.  Is status post appendectomy but still has her gallbladder.  Past Medical History:  Diagnosis Date  . Cancer ([place])    melanoma- on left ear  . Dysrhythmia    palpatations if thyroid meds are "off"  . Hypothyroidism   . Motion sickness    cars - back seat  . Seizures ([place])    22 yrs ago - controlled on Depakote  . Thyroid disease     Patient Active Problem List   Diagnosis Date Noted  . Special screening for malignant neoplasms, colon   . Benign neoplasm of ascending colon   . Acquired hypothyroidism 12/07/2014  . Taking medication for chronic disease 12/07/2014    Past Surgical History:  Procedure Laterality Date  . APPENDECTOMY    . BREAST CYST ASPIRATION     not sure which side-neg  . CESAREAN SECTION     x 2  . COLONOSCOPY WITH PROPOFOL N/A 01/20/2015   Procedure: COLONOSCOPY WITH PROPOFOL;  Surgeon: Lucilla Lame, MD;  Location: [place];  Service: Endoscopy;  Laterality: N/A;  . MOHS SURGERY  05/2015   L) ear  . POLYPECTOMY  01/20/2015   Procedure: POLYPECTOMY;  Surgeon: Lucilla Lame, MD;  Location:  [place];  Service: Endoscopy;;  . THYROIDECTOMY    . VAGINAL HYSTERECTOMY      Prior to Admission medications   Medication Sig Start Date End Date Taking? Authorizing Provider  cholecalciferol (VITAMIN D) 1000 units tablet Take 1,000 Units by mouth daily.   Yes [provider]  levothyroxine (SYNTHROID) 200 MCG tablet TAKE 1 TABLET BY MOUTH DAILY BEFORE BREAKFAST. TAKE IN PLACE OF 175 MCG 10/12/18  Yes Juline Patch, MD  levothyroxine (SYNTHROID) 25 MCG tablet Take 1 tablet (25 mcg total) by mouth daily before breakfast. Add to 278mcg= 279mcg daily due to labs 10/12/18  Yes Juline Patch, MD  Multiple Vitamin (MULTIVITAMIN) capsule Take 1 capsule by mouth daily.   Yes [provider]  vitamin B-12 (CYANOCOBALAMIN) 1000 MCG tablet Take 1,000 mcg by mouth daily.   Yes [provider]  Calcium Carbonate-Vitamin D (CALCIUM-VITAMIN D) 500-200 MG-UNIT per tablet Take 1 tablet by mouth daily.    [provider]    No Known Allergies  Family History  Problem Relation Age of Onset  . Cancer Mother   . Heart disease Father   . Diabetes Maternal Grandfather   . Breast cancer Paternal Aunt     Social History Social History   Tobacco Use  . Smoking status: Never Smoker  . Smokeless tobacco: Never Used  Substance Use Topics  . Alcohol use: Yes    Alcohol/week:  0.0 standard drinks    Comment: 1 glass wine/month  . Drug use: No    Review of Systems Constitutional: Negative for fever. Cardiovascular: Negative for chest pain. Respiratory: Negative for shortness of breath. Gastrointestinal: Upper abdominal pain.  Mild nausea but no vomiting. Genitourinary: Negative for urinary compaints Musculoskeletal: Negative for musculoskeletal complaints Skin: Negative for skin complaints  Neurological: Negative for headache All other ROS negative  ____________________________________________   PHYSICAL EXAM:  VITAL SIGNS: ED Triage Vitals  Enc  Vitals Group     BP 03/01/19 0511 (!) 145/85     Pulse Rate 03/01/19 0511 89     Resp 03/01/19 0511 18     Temp 03/01/19 0511 97.6 F (36.4 C)     Temp src --      SpO2 03/01/19 0511 100 %     Weight 03/01/19 0512 220 lb (99.8 kg)     Height 03/01/19 0512 5' 7.5" (1.715 m)     Head Circumference --      Peak Flow --      Pain Score 03/01/19 0512 10     Pain Loc --      Pain Edu? --      Excl. in [place]? --    Constitutional: Alert and oriented. Well appearing and in no distress. Eyes: Normal exam ENT      Head: Normocephalic and atraumatic      Mouth/Throat: Mucous membranes are moist. Cardiovascular: Normal rate, regular rhythm. Respiratory: Normal respiratory effort without tachypnea nor retractions. Breath sounds are clear Gastrointestinal: Soft, moderate upper abdominal tenderness extending down into the left lower quadrant.  No rebound guarding or distention. Musculoskeletal: Nontender with normal range of motion in all extremities.  Neurologic:  Normal speech and language. No gross focal neurologic deficits Skin:  Skin is warm, dry and intact.  Psychiatric: Mood and affect are normal. Speech and behavior are normal.   ____________________________________________    EKG  EKG viewed and interpreted by myself shows a normal sinus rhythm at 91 bpm with a narrow QRS, normal axis, normal intervals, nonspecific ST changes without ST elevation.  ____________________________________________    RADIOLOGY  CT scan shows several large liver masses which are indeterminant.  There is a 2.3 cm structure in the gallbladder neck.  ____________________________________________   INITIAL IMPRESSION / ASSESSMENT AND PLAN / ED COURSE  Pertinent labs & imaging results that were available during my care of the patient were reviewed by me and considered in my medical decision making (see chart for details).   Patient presents to the emergency department for upper abdominal pain beginning  at 7 PM last night and continuing.  Patient has mild tenderness to palpation most across the upper abdomen and left lower quadrant.  No significant right upper quadrant tenderness.  Labs are largely within normal limits besides a slight leukocytosis of 12,700.  Urinalysis pending.  Differential at this time would include SBO, partial SBO, colitis or diverticulitis, biliary colic/cholecystitis.  We will proceed with CT imaging to further evaluate.  Patient agreeable.  Patient CT shows several large liver masses which will need outpatient follow-up.  There is a 2.3 cm structure in the gallbladder neck we will proceed with right upper quadrant ultrasound to further differentiate.  Ultrasound shows 2.7 cm gallstone with gallbladder wall thickening.  Surgery has seen the patient will be admitting to their service.  Karen Franco was evaluated in Emergency Department on 03/01/2019 for the symptoms described in the history of present illness. She  was evaluated in the context of the global COVID-19 pandemic, which necessitated consideration that the patient might be at risk for infection with the SARS-CoV-2 virus that causes COVID-19. Institutional protocols and algorithms that pertain to the evaluation of patients at risk for COVID-19 are in a state of rapid change based on information released by regulatory bodies including the CDC and federal and state organizations. These policies and algorithms were followed during the patient's care in the ED.  ____________________________________________   FINAL CLINICAL IMPRESSION(S) / ED DIAGNOSES  Abdominal pain Cholecystitis   Harvest Dark, MD 03/01/19 1206

## 2019-03-01 NOTE — H&P (Addendum)
[facility]ICAL (cpt [phone])  HISTORY OF PRESENT ILLNESS (HPI):  58 y.o. female presented to [facility] ED today for abdominal pain. Patient reports the acute onset of RUQ abdominal pain around 7pm last night. She described the pain as sharp stabbing pain. The pain does not radiate. Nothing seems to make it better. She denied any associate fever, chills, nausea, emesis, CP, SOB, jaundice, itching, stool or urinary changes. She noted similar pain about 7 days ago but this resolved on its own and thinking back she believes she can recall numerous instances of similar, less severe, pains that resolved spontaneously that she attributed to GERD. No association with food however she reports two fatty meals prior to the onset of the pain yesterday. Previous abdominal surgeries including appendectomy, hysterectomy, and c-section x2. Work up in the ED was concerning for mild leukocytosis and cholelithiasis with mild gallbladder wall thickening.   General surgery is consulted by emergency medicine physician Dr Harvest Dark, MD for evaluation and management of RUQ pain and likely early acute cholecystitis.    PAST MEDICAL HISTORY (PMH):  Past Medical History:  Diagnosis Date  . Cancer ([place])    melanoma- on left ear  . Dysrhythmia    palpatations if thyroid meds are "off"  . Hypothyroidism   . Motion sickness    cars - back seat  . Seizures ([place])    22 yrs ago - controlled on Depakote  . Thyroid disease     Reviewed. Otherwise negative.   PAST SURGICAL HISTORY ([place]):  Past Surgical History:  Procedure Laterality Date  . APPENDECTOMY    . BREAST CYST ASPIRATION     not sure which side-neg  . CESAREAN SECTION     x 2  . COLONOSCOPY WITH PROPOFOL N/A 01/20/2015   Procedure: COLONOSCOPY WITH PROPOFOL;  Surgeon: Lucilla Lame, MD;  Location: [place];  Service: Endoscopy;  Laterality: N/A;  . MOHS SURGERY  05/2015   L) ear  . POLYPECTOMY  01/20/2015   Procedure: POLYPECTOMY;  Surgeon: Lucilla Lame, MD;  Location: [place];  Service: Endoscopy;;  . THYROIDECTOMY    . VAGINAL HYSTERECTOMY      Reviewed. Otherwise negative.   MEDICATIONS:  Prior to Admission medications   Medication Sig Start Date End Date Taking? Authorizing Provider  cholecalciferol (VITAMIN D) 1000 units tablet Take 1,000 Units by mouth daily.   Yes [provider]  levothyroxine (SYNTHROID) 200 MCG tablet TAKE 1 TABLET BY MOUTH DAILY BEFORE BREAKFAST. TAKE IN PLACE OF 175 MCG 10/12/18  Yes Juline Patch, MD  levothyroxine (SYNTHROID) 25 MCG tablet Take 1 tablet (25 mcg total) by mouth daily before breakfast. Add to 252mcg= 263mcg daily due to labs 10/12/18  Yes Juline Patch, MD  Multiple Vitamin (MULTIVITAMIN) capsule Take 1 capsule by mouth daily.   Yes [provider]  vitamin B-12 (CYANOCOBALAMIN) 1000 MCG tablet Take 1,000 mcg by mouth daily.   Yes [provider]  Calcium Carbonate-Vitamin D (CALCIUM-VITAMIN D) 500-200 MG-UNIT per tablet Take 1 tablet by mouth daily.    [provider]     ALLERGIES:  No Known Allergies   SOCIAL HISTORY:  Social History   Socioeconomic History  . Marital status: Married    Spouse name: Not on file  . Number of children: Not on file  . Years of education: Not on file  . Highest education level: Not on file  Occupational History  . Not on file  Social Needs  . Financial resource strain: Not on file  . Food insecurity    Worry: Not on file    Inability: Not on file  . Transportation needs    Medical: Not on file    Non-medical: Not on file  Tobacco Use  . Smoking status: Never Smoker  . Smokeless tobacco: Never Used  Substance and Sexual Activity  . Alcohol use: Yes    Alcohol/week: 0.0 standard drinks    Comment: 1 glass wine/month  . Drug use: No  . Sexual activity: Yes  Lifestyle  . Physical activity    Days per week: Not on file    Minutes per session: Not on  file  . Stress: Not on file  Relationships  . Social Herbalist on phone: Not on file    Gets together: Not on file    Attends religious service: Not on file    Active member of club or organization: Not on file    Attends meetings of clubs or organizations: Not on file    Relationship status: Not on file  . Intimate partner violence    Fear of current or ex partner: Not on file    Emotionally abused: Not on file    Physically abused: Not on file    Forced sexual activity: Not on file  Other Topics Concern  . Not on file  Social History Narrative  . Not on file     FAMILY HISTORY:  Family History  Problem Relation Age of Onset  . Cancer Mother   . Heart disease Father   . Diabetes Maternal Grandfather   . Breast cancer Paternal Aunt     Otherwise negative.   REVIEW OF SYSTEMS:  Review of Systems  Constitutional: Negative for chills and fever.  HENT: Negative for congestion and sore throat.   Respiratory: Negative for cough and shortness of breath.   Cardiovascular: Negative for chest pain.  Gastrointestinal: Positive for abdominal pain. Negative for blood in stool, constipation, diarrhea, nausea and vomiting.  Genitourinary: Negative for dysuria and urgency.  Skin: Negative for itching and rash.  All other systems reviewed and are negative.   VITAL SIGNS:  Temp:  [97.6 F (36.4 C)] 97.6 F (36.4 C) (10/19 0511) Pulse Rate:  [80-89] 80 (10/19 1030) Resp:  [9-18] 9 (10/19 1030) BP: (126-147)/(63-85) 145/75 (10/19 1030) SpO2:  [100 %] 100 % (10/19 1030) Weight:  [99.8 kg] 99.8 kg (10/19 0512)     Height: 5' 7.5" (171.5 cm) Weight: 99.8 kg BMI (Calculated): 33.93   PHYSICAL EXAM:  Physical Exam Vitals signs and nursing note reviewed.  Constitutional:      General: She is not in acute distress.    Appearance: She is well-developed. She is obese. She is not ill-appearing.  HENT:     Head: Normocephalic and atraumatic.  Eyes:     General: No scleral  icterus.    Extraocular Movements: Extraocular movements intact.  Cardiovascular:     Rate and Rhythm: Normal rate and regular rhythm.     Heart sounds: Normal heart sounds. No murmur. No friction rub. No gallop.   Pulmonary:     Effort: Pulmonary effort is normal. No respiratory distress.     Breath sounds: Normal breath sounds. No wheezing or rhonchi.  Abdominal:     General: A surgical scar is present. There is no distension.     Tenderness: There is abdominal tenderness in the right upper quadrant and epigastric area.  There is no guarding or rebound. Positive signs include Murphy's sign.     Hernia: No hernia is present.     Comments: Previous surgical scars present  Genitourinary:    Comments: Deferred Skin:    General: Skin is warm and dry.     Coloration: Skin is not jaundiced or pale.  Neurological:     General: No focal deficit present.     Mental Status: She is alert and oriented to person, place, and time.  Psychiatric:        Mood and Affect: Mood normal.        Behavior: Behavior normal.     INTAKE/OUTPUT:  This shift: No intake/output data recorded.  Last 2 shifts: @IOLAST2SHIFTS @  Labs:  CBC Latest Ref Rng & Units 03/01/2019 11/07/2015 12/09/2014  WBC 4.0 - 10.5 K/uL 12.7(H) 7.4 7.8  Hemoglobin 12.0 - 15.0 g/dL 14.0 14.4 14.0  Hematocrit 36.0 - 46.0 % 42.8 43.7 42.4  Platelets 150 - 400 K/uL 397 404(H) 418(H)   CMP Latest Ref Rng & Units 03/01/2019 10/03/2017 10/30/2016  Glucose 70 - 99 mg/dL 110(H) 73 65  BUN 6 - 20 mg/dL 15 20 19   Creatinine 0.44 - 1.00 mg/dL 0.56 0.94 0.69  Sodium 135 - 145 mmol/L 138 140 142  Potassium 3.5 - 5.1 mmol/L 4.4 4.7 5.4(H)  Chloride 98 - 111 mmol/L 104 100 100  CO2 22 - 32 mmol/L 27 24 26   Calcium 8.9 - 10.3 mg/dL 9.1 9.5 8.9  Total Protein 6.5 - 8.1 g/dL 7.1 6.5 -  Total Bilirubin 0.3 - 1.2 mg/dL 0.5 <0.2 -  Alkaline Phos 38 - 126 U/L 90 63 -  AST 15 - 41 U/L 19 16 -  ALT 0 - 44 U/L 18 18 -     Imaging studies:   RUQ  [place] (03/01/2019) personally reviewed showing cholelithiasis and gallbladder wall thickening, and radiologist report reviewed:  IMPRESSION: 1. Gallstone measuring 2.7 cm. Mildly thickened gallbladder wall. Negative sonographic Murphy sign. Findings are technically indeterminate but raise the possibility of early cholecystitis given associated pain.  2. Several hyperechoic liver lesions as seen on CT, possibly representing hemangiomas. MRI with and without IV contrast is indicated for further characterization.   CT Abdomen/Pelvis (03/01/2019) personally reviewed showing cholelithiasis, and radiologist report reviewed below:  IMPRESSION: 1. Three scattered hypodense indeterminate liver masses, largest 4.2 cm in the segment 6 right liver lobe. MRI abdomen without and with IV contrast is indicated for further characterization. 2. Noncalcified round 2.3 cm structure in the gallbladder neck, potentially a noncalcified gallstone. Right upper quadrant ultrasound may be obtained if there is clinical concern for acute cholecystitis. No biliary ductal dilatation. 3. Minimal sigmoid diverticulosis, with no evidence of acute diverticulitis. No evidence of bowel obstruction or acute bowel inflammation. 4. Tiny 2 mm left lung base pulmonary nodule. No follow-up needed if patient is low-risk. Non-contrast chest CT can be considered in 12 months if patient is high-risk.    Assessment/Plan: (ICD-10's: K81.0) 59 y.o. female with mild leukocytosis and RUQ abdominal pain most likely attributable to symptomatic cholelithiasis vs early acute cholecystitis.   - Admit to general surgery  - NPO + IVF  - IV Abx (Zosyn)   - Will tentatively plan for laparoscopic cholecystectomy with Dr Celine Ahr this afternoon pending OR/Anesthesia availability  - All risks, benefits, and alternatives to above procedure(s) were discussed with the patient and her family, all of her questions were answered to her expressed  satisfaction, patient expresses she wishes  to proceed, and informed consent was obtained.  - pain control prn; antiemetics prn  - monitor abdominal examination  - mobilization encouraged   - medical management of comorbidities; hold meds now; restart post-op   - DVT prophylaxis; hold pending OR  All of the above findings and recommendations were discussed with the patient and her family, and all of their questions were answered to their expressed satisfaction.  -- Edison Simon, PA-C [facility] 03/01/2019, 12:09 PM [phone] M-F: 7am - 4pm  I saw and evaluated the patient.  I agree with the above documentation, exam, and plan, which I have edited where appropriate. Fredirick Maudlin  1:52 PM

## 2019-03-01 NOTE — Anesthesia Preprocedure Evaluation (Addendum)
Anesthesia Evaluation  Patient identified by MRN, date of birth, ID band Patient awake    Reviewed: Allergy & Precautions, H&P , NPO status , Patient's Chart, lab work & pertinent test results, reviewed documented beta blocker date and time   History of Anesthesia Complications Negative for: history of anesthetic complications  Airway Mallampati: III  TM Distance: >3 FB Neck ROM: full    Dental no notable dental hx. (+) Dental Advidsory Given, Teeth Intact   Pulmonary neg pulmonary ROS,    Pulmonary exam normal        Cardiovascular Exercise Tolerance: Good (-) hypertension(-) angina(-) Past MI and (-) Cardiac Stents Normal cardiovascular exam+ dysrhythmias (Palpitations if not on thyroid meds) (-) Valvular Problems/Murmurs     Neuro/Psych Seizures -, Well Controlled,  negative psych ROS   GI/Hepatic negative GI ROS, Neg liver ROS,   Endo/Other  neg diabetesHypothyroidism   Renal/GU negative Renal ROS  negative genitourinary   Musculoskeletal   Abdominal   Peds  Hematology negative hematology ROS (+)   Anesthesia Other Findings Past Medical History: No date: Cancer ([place])     Comment:  melanoma- on left ear No date: Dysrhythmia     Comment:  palpatations if thyroid meds are "off" No date: Hypothyroidism No date: Motion sickness     Comment:  cars - back seat No date: Seizures ([place])     Comment:  22 yrs ago - controlled on Depakote No date: Thyroid disease   Reproductive/Obstetrics negative OB ROS                            Anesthesia Physical Anesthesia Plan  ASA: II  Anesthesia Plan: General   Post-op Pain Management:    Induction: Intravenous, Rapid sequence and Cricoid pressure planned  PONV Risk Score and Plan: 3 and Ondansetron, Dexamethasone, Midazolam, Promethazine and Treatment may vary due to age or medical condition  Airway Management Planned: Oral ETT  Additional  Equipment:   Intra-op Plan:   Post-operative Plan: Extubation in OR  Informed Consent: I have reviewed the patients History and Physical, chart, labs and discussed the procedure including the risks, benefits and alternatives for the proposed anesthesia with the patient or authorized representative who has indicated his/her understanding and acceptance.     Dental Advisory Given  Plan Discussed with: Anesthesiologist, CRNA and Surgeon  Anesthesia Plan Comments:        Anesthesia Quick Evaluation

## 2019-03-01 NOTE — ED Triage Notes (Signed)
Pt reports epigastric pain since 7pm last night; no vomiting; similar episode a week ago; pt says her pain is worse after she eats; pain just sits in the epigastric area; stabbing pain;

## 2019-03-02 ENCOUNTER — Encounter: Payer: Self-pay | Admitting: General Surgery

## 2019-03-02 DIAGNOSIS — Z9049 Acquired absence of other specified parts of digestive tract: Secondary | ICD-10-CM

## 2019-03-02 HISTORY — DX: Acquired absence of other specified parts of digestive tract: Z90.49

## 2019-03-02 LAB — HIV ANTIBODY (ROUTINE TESTING W REFLEX): HIV Screen 4th Generation wRfx: NONREACTIVE

## 2019-03-02 MED ORDER — HYDROCODONE-ACETAMINOPHEN 5-325 MG PO TABS: 1.0000 | ORAL_TABLET | ORAL | 0 refills | Status: DC | PRN

## 2019-03-02 NOTE — Progress Notes (Signed)
Patient went home accompanied her husband.Discharge instruction and medications were explained to the patient. She  verbally understood ..IV removed,clean ,dry and intact.

## 2019-03-02 NOTE — Discharge Instructions (Signed)
In addition to included general post-operative instructions for laparoscopic cholecystectomy,  Diet: Resume home diet.   Activity: No heavy lifting >20 pounds (children, pets, laundry, garbage) for 4 weeks, but light activity and walking are encouraged. Do not drive or drink alcohol if taking narcotic pain medications or having pain that might distract from driving.  Wound care: 2 days after surgery (10/21), you may shower/get incision wet with soapy water and pat dry (do not rub incisions), but no baths or submerging incision underwater until follow-up.   Medications: Resume all home medications. For mild to moderate pain: acetaminophen (Tylenol) or ibuprofen/naproxen (if no kidney disease). Combining Tylenol with alcohol can substantially increase your risk of causing liver disease. Narcotic pain medications, if prescribed, can be used for severe pain, though may cause nausea, constipation, and drowsiness. Do not combine Tylenol and Percocet (or similar) within a 6 hour period as Percocet (and similar) contain(s) Tylenol. If you do not need the narcotic pain medication, you do not need to fill the prescription.  Call office [phone] / [phone]) at any time if any questions, worsening pain, fevers/chills, bleeding, drainage from incision site, or other concerns.

## 2019-03-02 NOTE — Anesthesia Postprocedure Evaluation (Signed)
Anesthesia Post Note  Patient: Karen Franco  Procedure(s) Performed: LAPAROSCOPIC CHOLECYSTECTOMY (N/A )  Patient location during evaluation: PACU Anesthesia Type: General Level of consciousness: awake and alert and oriented Pain management: pain level controlled Vital Signs Assessment: post-procedure vital signs reviewed and stable Respiratory status: spontaneous breathing Cardiovascular status: blood pressure returned to baseline Anesthetic complications: no     Last Vitals:  Vitals:   03/01/19 1850 03/01/19 2006  BP: 129/73 133/64  Pulse: 89 92  Resp: 16 16  Temp: 37 C (!) 36.4 C  SpO2: 98% 97%    Last Pain:  Vitals:   03/01/19 2138  TempSrc:   PainSc: 5                  Jaileen Janelle

## 2019-03-02 NOTE — Discharge Summary (Addendum)
[facility] SURGICAL DISCHARGE SUMMARY   Patient ID: Karen Franco MRN: [id number] DOB/AGE: 1961/05/06 58 y.o.  Admit date: 03/01/2019 Discharge date: 03/02/2019  Discharge Diagnoses Patient Active Problem List   Diagnosis Date Noted  . Acute cholecystitis 03/01/2019    Consultants None  Procedures 03/01/2019:  Laparoscopic Cholecystectomy  HPI: 58 y.o. female presented [facility] ED today for abdominal pain. Patient reports the acute onset of RUQ abdominal pain around 7pm last night. She described the pain as sharp stabbing pain. The pain does not radiate. Nothing seems to make it better. She denied any associate fever, chills, nausea, emesis, CP, SOB, jaundice, itching, stool or urinary changes. She noted similar pain about 7 days ago but this resolved on its own and thinking back she believes she can recall numerous instances of similar, less severe, pains that resolved spontaneously that she attributed to GERD. No association with food however she reports two fatty meals prior to the onset of the pain yesterday. Previous abdominal surgeries including appendectomy, hysterectomy, and c-section x2. Work up in the ED was concerning for mild leukocytosis and cholelithiasis with mild gallbladder wall thickening  Hospital Course: Informed consent was obtained and documented, and patient underwent uneventful laparoscopic choelcystectomy (Dr Celine Ahr, 03/01/2019).  Post-operatively, patient's pain/symptoms improved/resolved and advancement of patient's diet and ambulation were well-tolerated. The remainder of patient's hospital course was essentially unremarkable, and discharge planning was initiated accordingly with patient safely able to be discharged home with appropriate discharge instructions, pain control, and outpatient follow-up after all of her questions were answered to her expressed satisfaction.   Discharge Condition: Good   Physical Examination:   Constitutional: Well appearing female, NAD Pulmonary: Normal effort, no respiratory distress Gastrointestinal: Soft, non-tender, non-distended, no rebound or guarding Skin: Laparoscopic incisions are CDI with steri-strips, no erythema or drainage   Allergies as of 03/02/2019   No Known Allergies     Medication List    TAKE these medications   calcium-vitamin D 500-200 MG-UNIT tablet Take 1 tablet by mouth daily.   cholecalciferol 1000 units tablet Commonly known as: VITAMIN D Take 1,000 Units by mouth daily.   HYDROcodone-acetaminophen 5-325 MG tablet Commonly known as: NORCO/VICODIN Take 1 tablet by mouth every 4 (four) hours as needed for severe pain.   levothyroxine 25 MCG tablet Commonly known as: SYNTHROID Take 1 tablet (25 mcg total) by mouth daily before breakfast. Add to 271mcg= 258mcg daily due to labs   levothyroxine 200 MCG tablet Commonly known as: SYNTHROID TAKE 1 TABLET BY MOUTH DAILY BEFORE BREAKFAST. TAKE IN PLACE OF 175 MCG   multivitamin capsule Take 1 capsule by mouth daily.   vitamin B-12 1000 MCG tablet Commonly known as: CYANOCOBALAMIN Take 1,000 mcg by mouth daily.        Follow-up Information    Fredirick Maudlin, MD. Schedule an appointment as soon as possible for a visit in 2 week(s).   Specialty: General Surgery Why: Can see Zach PA, s/p lap chole...Marland KitchenMarland Kitchen2 week follow up  Contact information: [address] [place] [address] [phone]            Time spent on discharge management including discussion of hospital course, clinical condition, outpatient instructions, prescriptions, and follow up with the patient and members of the medical team: >30 minutes  -- Edison Simon , PA-C [facility]  03/02/2019, 10:16 AM [phone] M-F: 7am - 4pm  I saw and evaluated the patient.  I agree with the above documentation, exam, and plan,  which I have edited where appropriate. Fredirick Maudlin  12:26 PM

## 2019-03-02 NOTE — Plan of Care (Signed)

## 2019-03-03 LAB — SURGICAL PATHOLOGY

## 2019-03-18 ENCOUNTER — Ambulatory Visit (INDEPENDENT_AMBULATORY_CARE_PROVIDER_SITE_OTHER): Payer: Self-pay | Admitting: Physician Assistant

## 2019-03-18 ENCOUNTER — Encounter: Payer: Self-pay | Admitting: Physician Assistant

## 2019-03-18 ENCOUNTER — Other Ambulatory Visit: Payer: Self-pay

## 2019-03-18 VITALS — BP 140/79 | HR 83 | Temp 94.8°F | Ht 67.0 in | Wt 221.6 lb

## 2019-03-18 DIAGNOSIS — Z09 Encounter for follow-up examination after completed treatment for conditions other than malignant neoplasm: Secondary | ICD-10-CM

## 2019-03-18 DIAGNOSIS — K810 Acute cholecystitis: Secondary | ICD-10-CM

## 2019-03-18 NOTE — Patient Instructions (Signed)
   Follow-up with our office as needed.  Please call and ask to speak with a nurse if you develop questions or concerns.  

## 2019-03-18 NOTE — Progress Notes (Signed)
[facility] POST-OP OFFICE VISIT  03/18/2019  HPI: Karen Franco is a 58 y.o. female 17 days s/p laparoscopic cholecystectomy for acute cholecystitis with Dr Celine Ahr.   Today, she reports that she is doing fantastic. No issues with pain, fever, nausea, or emesis. tolerating PO. No issues with bowel fucntion  Vital signs: BP 140/79   Pulse 83   Temp (!) 94.8 F (34.9 C) (Temporal)   Ht 5\' 7"  (1.702 m)   Wt 221 lb 9.6 oz (100.5 kg)   SpO2 98%   BMI 34.71 kg/m    Physical Exam: Constitutional: Well appearing female, NAD Abdomen: Soft, non-tender, Non-distended, no rebound/guarding Skin: Laparoscopic incisions are well healed, no erythema or drainage  Assessment/Plan: This is a 58 y.o. female 17 days s/p laparoscopic cholecystectomy for acute cholecystitis   - pain control prn  - okay to submerge wounds  - complete lifting restrictions  - Reviewed pathology: Acute cholecystitis and cholelithiasis  - rtc prn  -- Edison Simon, PA-C [facility] 03/18/2019, 9:31 AM [phone] M-F: 7am - 4pm

## 2019-04-13 ENCOUNTER — Ambulatory Visit (INDEPENDENT_AMBULATORY_CARE_PROVIDER_SITE_OTHER): Payer: 59 | Admitting: Family Medicine

## 2019-04-13 ENCOUNTER — Encounter: Payer: Self-pay | Admitting: Family Medicine

## 2019-04-13 ENCOUNTER — Other Ambulatory Visit: Payer: Self-pay

## 2019-04-13 VITALS — BP 120/80 | HR 80 | Ht 67.0 in | Wt 223.0 lb

## 2019-04-13 DIAGNOSIS — Z23 Encounter for immunization: Secondary | ICD-10-CM

## 2019-04-13 DIAGNOSIS — E039 Hypothyroidism, unspecified: Secondary | ICD-10-CM | POA: Diagnosis not present

## 2019-04-13 DIAGNOSIS — R932 Abnormal findings on diagnostic imaging of liver and biliary tract: Secondary | ICD-10-CM

## 2019-04-13 DIAGNOSIS — R918 Other nonspecific abnormal finding of lung field: Secondary | ICD-10-CM | POA: Diagnosis not present

## 2019-04-13 MED ORDER — LEVOTHYROXINE SODIUM 25 MCG PO TABS: 25.0000 ug | ORAL_TABLET | Freq: Every day | ORAL | 1 refills | Status: DC

## 2019-04-13 MED ORDER — LEVOTHYROXINE SODIUM 200 MCG PO TABS: ORAL_TABLET | ORAL | 1 refills | Status: DC

## 2019-04-13 NOTE — Progress Notes (Signed)
Date:  04/13/2019   Name:  Karen Franco   DOB:  03/25/1961   MRN:  [id number]   Chief Complaint: Hypothyroidism (refill med)  Thyroid Problem Presents for follow-up visit. Patient reports no anxiety, cold intolerance, constipation, depressed mood, diaphoresis, diarrhea, dry skin, fatigue, hair loss, heat intolerance, hoarse voice, leg swelling, menstrual problem, nail problem, palpitations, tremors, visual change, weight gain or weight loss. The symptoms have been stable.    Lab Results  Component Value Date   CREATININE 0.56 03/01/2019   BUN 15 03/01/2019   NA 138 03/01/2019   K 4.4 03/01/2019   CL 104 03/01/2019   CO2 27 03/01/2019   Lab Results  Component Value Date   CHOL 246 (H) 10/07/2017   HDL 76 10/07/2017   LDLCALC 149 (H) 10/07/2017   TRIG 107 10/07/2017   CHOLHDL 3.2 10/07/2017   Lab Results  Component Value Date   TSH 0.207 (L) 10/09/2018   No results found for: HGBA1C   Review of Systems  Constitutional: Negative.  Negative for chills, diaphoresis, fatigue, fever, unexpected weight change, weight gain and weight loss.  HENT: Negative for congestion, ear discharge, ear pain, hoarse voice, rhinorrhea, sinus pressure, sneezing and sore throat.   Eyes: Negative for photophobia, pain, discharge, redness and itching.  Respiratory: Negative for cough, shortness of breath, wheezing and stridor.   Cardiovascular: Negative for palpitations.  Gastrointestinal: Negative for abdominal pain, blood in stool, constipation, diarrhea, nausea and vomiting.  Endocrine: Negative for cold intolerance, heat intolerance, polydipsia, polyphagia and polyuria.  Genitourinary: Negative for dysuria, flank pain, frequency, hematuria, menstrual problem, pelvic pain, urgency, vaginal bleeding and vaginal discharge.  Musculoskeletal: Negative for arthralgias, back pain and myalgias.  Skin: Negative for rash.  Allergic/Immunologic: Negative for environmental allergies and food  allergies.  Neurological: Negative for dizziness, tremors, weakness, light-headedness, numbness and headaches.  Hematological: Negative for adenopathy. Does not bruise/bleed easily.  Psychiatric/Behavioral: Negative for dysphoric mood. The patient is not nervous/anxious.     Patient Active Problem List   Diagnosis Date Noted   S/P laparoscopic cholecystectomy 03/02/2019   Acute cholecystitis 03/01/2019   Special screening for malignant neoplasms, colon    Benign neoplasm of ascending colon    Acquired hypothyroidism 12/07/2014   Taking medication for chronic disease 12/07/2014    No Known Allergies  Past Surgical History:  Procedure Laterality Date   APPENDECTOMY     BREAST CYST ASPIRATION     not sure which side-neg   CESAREAN SECTION     x 2   CHOLECYSTECTOMY N/A 03/01/2019   Procedure: LAPAROSCOPIC CHOLECYSTECTOMY;  Surgeon: Fredirick Maudlin, MD;  Location: [facility];  Service: General;  Laterality: N/A;   COLONOSCOPY WITH PROPOFOL N/A 01/20/2015   Procedure: COLONOSCOPY WITH PROPOFOL;  Surgeon: Lucilla Lame, MD;  Location: [place];  Service: Endoscopy;  Laterality: N/A;   MOHS SURGERY  05/2015   L) ear   POLYPECTOMY  01/20/2015   Procedure: POLYPECTOMY;  Surgeon: Lucilla Lame, MD;  Location: [place];  Service: Endoscopy;;   THYROIDECTOMY     VAGINAL HYSTERECTOMY      Social History   Tobacco Use   Smoking status: Never Smoker   Smokeless tobacco: Never Used  Substance Use Topics   Alcohol use: Yes    Alcohol/week: 0.0 standard drinks    Comment: 1 glass wine/month   Drug use: No     Medication list has been reviewed and updated.  Current Meds  Medication Sig  Calcium Carbonate-Vitamin D (CALCIUM-VITAMIN D) 500-200 MG-UNIT per tablet Take 1 tablet by mouth daily.   cholecalciferol (VITAMIN D) 1000 units tablet Take 1,000 Units by mouth daily.   levothyroxine (SYNTHROID) 200 MCG tablet TAKE 1 TABLET BY MOUTH DAILY  BEFORE BREAKFAST. TAKE IN PLACE OF 175 MCG   levothyroxine (SYNTHROID) 25 MCG tablet Take 1 tablet (25 mcg total) by mouth daily before breakfast. Add to 277mcg= 27mcg daily due to labs   Multiple Vitamin (MULTIVITAMIN) capsule Take 1 capsule by mouth daily.   vitamin B-12 (CYANOCOBALAMIN) 1000 MCG tablet Take 1,000 mcg by mouth daily.    PHQ 2/9 Scores 04/13/2019 01/23/2018 10/03/2017 11/07/2015  PHQ - 2 Score 0 0 0 0  PHQ- 9 Score 0 - 0 -    BP Readings from Last 3 Encounters:  04/13/19 120/80  03/18/19 140/79  03/02/19 128/69    Physical Exam Vitals signs and nursing note reviewed.  Constitutional:      General: She is not in acute distress.    Appearance: She is not diaphoretic.  HENT:     Head: Normocephalic and atraumatic.     Right Ear: Tympanic membrane, ear canal and external ear normal.     Left Ear: Tympanic membrane, ear canal and external ear normal.     Nose: Nose normal.  Eyes:     General:        Right eye: No discharge.        Left eye: No discharge.     Conjunctiva/sclera: Conjunctivae normal.     Pupils: Pupils are equal, round, and reactive to light.  Neck:     Musculoskeletal: Normal range of motion and neck supple.     Thyroid: No thyromegaly.     Vascular: No carotid bruit or JVD.  Cardiovascular:     Rate and Rhythm: Normal rate and regular rhythm.     Chest Wall: PMI is not displaced.     Pulses: Normal pulses. No decreased pulses.     Heart sounds: Normal heart sounds, S1 normal and S2 normal. No murmur. No systolic murmur. No diastolic murmur. No friction rub. No gallop. No S3 or S4 sounds.   Pulmonary:     Effort: Pulmonary effort is normal.     Breath sounds: Normal breath sounds. No rales.  Abdominal:     General: Bowel sounds are normal.     Palpations: Abdomen is soft. There is no hepatomegaly, splenomegaly or mass.     Tenderness: There is no abdominal tenderness. There is no guarding.  Musculoskeletal: Normal range of motion.    Lymphadenopathy:     Cervical: No cervical adenopathy.  Skin:    General: Skin is warm and dry.     Capillary Refill: Capillary refill takes less than 2 seconds.  Neurological:     Mental Status: She is alert.     Deep Tendon Reflexes: Reflexes are normal and symmetric.     Wt Readings from Last 3 Encounters:  04/13/19 223 lb (101.2 kg)  03/18/19 221 lb 9.6 oz (100.5 kg)  03/01/19 220 lb (99.8 kg)    BP 120/80    Pulse 80    Ht 5\' 7"  (1.702 m)    Wt 223 lb (101.2 kg)    BMI 34.93 kg/m   Assessment and Plan: 1. Acquired hypothyroidism Chronic.  Controlled.  Will check TSH to assess control of supplementation.  At this point time we will continue thyroid 200 mcg and a 25 mcg Synthroid. - TSH -  levothyroxine (SYNTHROID) 200 MCG tablet; TAKE 1 TABLET BY MOUTH DAILY BEFORE BREAKFAST. TAKE IN PLACE OF 175 MCG  Dispense: 90 tablet; Refill: 1 - levothyroxine (SYNTHROID) 25 MCG tablet; Take 1 tablet (25 mcg total) by mouth daily before breakfast. Add to 226mcg= 239mcg daily due to labs  Dispense: 90 tablet; Refill: 1  2. Abnormal CT of liver Is patient brings to my attention that there is an abnormality of her CT of the abdomen.  There were multiple masses that were noted by ultrasound suggested that these may be hemangiomas.  It was recommended that the patient undergo an MRI with and without contrast to further evaluate these areas and this was scheduled to be done this week prostate decide next week if we need to do further evaluations. - MR Abdomen W Wo Contrast; Future  3. Pulmonary nodules During the course of this evaluation it was noted that there were small nodules noted in the base of the lung which did not have any suspicious characterizations.  Patient does not have any risk factors for lung cancer and we will recheck this in a year  4. Influenza vaccine needed Discussed and administered - Flu Vaccine QUAD 6+ mos PF IM (Fluarix Quad PF)

## 2019-04-14 LAB — TSH: TSH: 0.096 u[IU]/mL — ABNORMAL LOW (ref 0.450–4.500)

## 2019-04-23 ENCOUNTER — Other Ambulatory Visit: Payer: Self-pay

## 2019-04-23 ENCOUNTER — Ambulatory Visit
Admission: RE | Admit: 2019-04-23 | Discharge: 2019-04-23 | Disposition: A | Discharge status: Home / Self Care | Payer: 59 | Source: Ambulatory Visit | Attending: Family Medicine | Admitting: Family Medicine

## 2019-04-23 DIAGNOSIS — R932 Abnormal findings on diagnostic imaging of liver and biliary tract: Secondary | ICD-10-CM | POA: Diagnosis not present

## 2019-04-23 MED ORDER — GADOBUTROL 1 MMOL/ML IV SOLN
10.0000 mL | Freq: Once | INTRAVENOUS | Status: AC | PRN
  Administered 2019-04-23: 10 mL via INTRAVENOUS

## 2019-04-28 ENCOUNTER — Other Ambulatory Visit: Payer: Self-pay

## 2019-04-28 DIAGNOSIS — R932 Abnormal findings on diagnostic imaging of liver and biliary tract: Secondary | ICD-10-CM

## 2019-04-28 DIAGNOSIS — K769 Liver disease, unspecified: Secondary | ICD-10-CM

## 2019-04-28 NOTE — Progress Notes (Unsigned)
Put in referral to oncology

## 2019-05-03 ENCOUNTER — Ambulatory Visit: Payer: 59 | Admitting: Hematology and Oncology

## 2019-05-05 NOTE — Progress Notes (Signed)
[facility]  [address], [address] [place], [place] [address] Phone: [phone]  Fax: [phone]   Clinic Day:  05/06/2019  Referring physician: Juline Patch, MD  Chief Complaint: Karen Franco is a 58 y.o. female with liver lesions who is referred in consultation by Dr Otilio Miu for assessment and management.   HPI: The patient was admitted to [facility] 03/01/2019 to 03/02/2019 for acute cholecystitis.  She presented to the ER with abdominal pain.  Abdomen and pelvis CT on 03/01/2019 revealed 3 scattered hypodense indeterminate liver masses, largest 4.2 cm in the segment 6 right liver lobe.  There was a noncalcified round 2.3 cm structure in the gallbladder neck, potentially a noncalcified gallstone.  There was minimal sigmoid diverticulosis with no evidence of acute diverticulitis. There was no evidence of bowel obstruction or acute bowel inflammation. There was a tiny 2 mm left lung base pulmonary nodule.  RUQ ultrasound revealed a 2.7 cm gallstone with a mildly thickened gallbladder wall.  MRI was recommended for further characterization.  She underwent laparoscopic cholecystectomy by Dr Fredirick Maudlin on 03/01/2019.  Pathology revealed acute cholecystitis and cholelithiasis.  There was a benign cystic duct lymph node.  MRI of abdomen w/wo contrast on 04/23/2019 showed a 3.4 x 3.2 cm subcapsular mass of the peripheral right lobe of the liver, hepatic segment VI, with progressive, discontinuous peripheral nodular enhancement. A similar mass in the posterior left lobe of the liver measured 1.8 cm. These lesions were c/w benign hepatic hemangiomata. There was a very subtle T2 intermediate lesion of the right lobe of the liver anterior to the first lesion; there was no associated enhancement or diffusion abnormality of this site. This was of uncertain nature, possibly representing a very small focal nodular hyperplasia. Given the reported history of melanoma,  metastatic disease was very difficult to strictly exclude. There was a 1.0 cm right eccentric extra-axial nodule within the spinal column at the level of T12 with intrinsic T1 hyperintensity and some contrast enhancement.  This was likely a small meningioma, although a small metastatic lesion could not be strictly excluded.  Dedicated contrast enhanced MR examination of the spine was recommended.  She is s/p interval cholecystectomy. There was no biliary ductal dilatation. Consider follow-up examination in 3-6 months to ensure stability.  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  She underwent biopsy by Dr Ree Edman in 2016/2017.  Pathology confirmed a 0.6 cm x 0.6 cm melanoma in situ.  She underwent Moh's surgery on 05/24/2015 by Dr Onalee Hua.  Symptomatically, she feels "great" since her gallbladder has been removed. She has midline back pain of "burning sensation" and has extreme sensitivity is lower back. She back discomfort if standing too long.  She will sit down to alleviate the pain. She has been seen by a chiropractor.   Colonoscopy on 01/20/2015 by Dr Allen Norris revealed a 4 mm polyp in ascending colon (tubular adenoma).  Follow-up colonoscopy is planned for 01/2020.  She has never had hepatitis.  She is unsure is she has had a blood transfusion with her two C-sections.  She was taking a B12 supplement; she hasn't taken it in years. She is not aware of testing for pernicious anemia.  Her mother had gastric cancer. Her paternal aunt had breast cancer.   Past Medical History:  Diagnosis Date  . Cancer ([place])    melanoma- on left ear  . Dysrhythmia    palpatations if thyroid meds are "off"  .  Hypothyroidism   . Motion sickness    cars - back seat  . Seizures ([place])    22 yrs ago - controlled on Depakote  . Thyroid disease     Past Surgical History:  Procedure Laterality Date  . APPENDECTOMY    . BREAST CYST ASPIRATION     not sure which side-neg  .  CESAREAN SECTION     x 2  . CHOLECYSTECTOMY N/A 03/01/2019   Procedure: LAPAROSCOPIC CHOLECYSTECTOMY;  Surgeon: Fredirick Maudlin, MD;  Location: [facility];  Service: General;  Laterality: N/A;  . COLONOSCOPY WITH PROPOFOL N/A 01/20/2015   Procedure: COLONOSCOPY WITH PROPOFOL;  Surgeon: Lucilla Lame, MD;  Location: [place];  Service: Endoscopy;  Laterality: N/A;  . MOHS SURGERY  05/2015   L) ear  . POLYPECTOMY  01/20/2015   Procedure: POLYPECTOMY;  Surgeon: Lucilla Lame, MD;  Location: [place];  Service: Endoscopy;;  . THYROIDECTOMY    . VAGINAL HYSTERECTOMY      Family History  Problem Relation Age of Onset  . Cancer Mother   . Heart disease Father   . Diabetes Maternal Grandfather   . Breast cancer Paternal Aunt     Social History:  reports that she has never smoked. She has never used smokeless tobacco. She reports current alcohol use. She reports that she does not use drugs. She works at [employer] as a Public affairs consultant. The patient is alone today.  Allergies: No Known Allergies  Current Medications: Current Outpatient Medications  Medication Sig Dispense Refill  . Calcium Carbonate-Vitamin D (CALCIUM-VITAMIN D) 500-200 MG-UNIT per tablet Take 1 tablet by mouth daily.    . cholecalciferol (VITAMIN D) 1000 units tablet Take 1,000 Units by mouth daily.    Marland Kitchen levothyroxine (SYNTHROID) 200 MCG tablet TAKE 1 TABLET BY MOUTH DAILY BEFORE BREAKFAST. TAKE IN PLACE OF 175 MCG 90 tablet 1  . levothyroxine (SYNTHROID) 25 MCG tablet Take 1 tablet (25 mcg total) by mouth daily before breakfast. Add to 221mcg= 264mcg daily due to labs 90 tablet 1  . Multiple Vitamin (MULTIVITAMIN) capsule Take 1 capsule by mouth daily.    . vitamin B-12 (CYANOCOBALAMIN) 1000 MCG tablet Take 1,000 mcg by mouth daily.     No current facility-administered medications for this visit.    Review of Systems  Constitutional: Negative.  Negative for chills, diaphoresis, fever,  malaise/fatigue and weight loss.       Feels "great".  HENT: Negative.  Negative for congestion, ear discharge, hearing loss, nosebleeds, sore throat and tinnitus.   Eyes: Negative for blurred vision and double vision.  Respiratory: Negative.  Negative for cough, hemoptysis, sputum production, shortness of breath and wheezing.   Cardiovascular: Negative for chest pain, palpitations, orthopnea, leg swelling and PND.  Gastrointestinal: Positive for diarrhea (s/p cholecystectomy). Negative for abdominal pain, blood in stool, constipation, heartburn, melena, nausea and vomiting.  Genitourinary: Negative.  Negative for dysuria, frequency, hematuria and urgency.  Musculoskeletal: Positive for back pain (burning sensation if stands too long). Negative for falls, joint pain and neck pain.  Skin: Negative.  Negative for rash.  Neurological: Negative.  Negative for dizziness, tingling, sensory change, speech change, focal weakness, weakness and headaches.  Endo/Heme/Allergies: Negative.  Does not bruise/bleed easily.  Psychiatric/Behavioral: Negative.  Negative for depression, memory loss and substance abuse. The patient does not have insomnia.    Performance status (ECOG): 0 - Asymptomatic  Vitals Blood pressure 124/61, pulse 72, temperature 98.5 F (36.9 C), temperature source Oral, weight 225  lb 13.8 oz (102.4 kg), SpO2 100 %.   Physical Exam  Constitutional: She is oriented to person, place, and time. She appears well-developed and well-nourished. No distress.  HENT:  Head: Normocephalic and atraumatic.  Mouth/Throat: Oropharynx is clear and moist. No oropharyngeal exudate.  Short graying brown hair.  Mask.  Eyes: Pupils are equal, round, and reactive to light. Conjunctivae and EOM are normal. No scleral icterus.  Blue eyes.  Neck: No JVD present.  Cardiovascular: Normal rate, regular rhythm and normal heart sounds.  No murmur heard. Pulmonary/Chest: Effort normal and breath sounds normal.  No respiratory distress. She has no wheezes. She has no rales. She exhibits no tenderness.  Abdominal: Soft. Bowel sounds are normal. She exhibits no distension and no mass. There is no abdominal tenderness. There is no rebound and no guarding.  Musculoskeletal:        General: No edema. Normal range of motion.     Cervical back: Normal range of motion and neck supple.     Comments: Slight tenderness in the lumbar/sacral area.  Lymphadenopathy:       Head (right side): No preauricular, no posterior auricular and no occipital adenopathy present.       Head (left side): No preauricular, no posterior auricular and no occipital adenopathy present.    She has no cervical adenopathy.    She has no axillary adenopathy.       Right: No inguinal and no supraclavicular adenopathy present.       Left: No inguinal and no supraclavicular adenopathy present.  Neurological: She is alert and oriented to person, place, and time.  Skin: Skin is warm and dry. No rash noted. She is not diaphoretic. No erythema. No pallor.  Psychiatric: She has a normal mood and affect. Her behavior is normal. Judgment and thought content normal.  Nursing note and vitals reviewed.   No visits with results within 3 Day(s) from this visit.  Latest known visit with results is:  Office Visit on 04/13/2019  Component Date Value Ref Range Status  . TSH 04/13/2019 0.096* 0.450 - 4.500 uIU/mL Final    Assessment:  Karen Franco is a 58 y.o. female with liver lesions detected on RUQ ultrasound and abdomen and pelvic CT after presenting with cholelithiasis.  She underwent laparoscopic cholecystectomy on 03/01/2019.  Pathology revealed acute cholecystitis and cholelithiasis.  There was a benign cystic duct lymph node.  MRI of abdomen w/wo contrast on 04/23/2019 showed a 3.4 x 3.2 cm subcapsular mass of the peripheral right lobe of the liver, hepatic segment VI, with progressive, discontinuous peripheral nodular enhancement. A  similar mass in the posterior left lobe of the liver measured 1.8 cm. These lesions were c/w benign hepatic hemangiomata. There was a very subtle T2 intermediate lesion of the right lobe of the liver anterior to the first lesion; there was no associated enhancement or diffusion abnormality of this site. This was of uncertain nature, possibly representing a very small focal nodular hyperplasia. Given the reported history of melanoma, metastatic disease is very difficult to strictly exclude. There was a 1.0 cm right eccentric extra-axial nodule within the spinal column at the level of T12 with intrinsic T1 hyperintensity and some contrast enhancement.  This was likely a small meningioma, although a small metastatic lesion could not be strictly excluded. Recommend dedicated contrast enhanced MR examination of the spine to definitively evaluate. She is s/p interval cholecystectomy. There was no biliary ductal dilatation. Consider follow-up examination in 3-6 months to ensure  stability.  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   Symptomatically, she feels "great".  She describes lower back pain when standing for long periods.  Exam reveals no pain at T12.  Plan: 1.   Labs today: AFP, hepatitis B core antibody, hepatitis C antibody. 2.   Liver lesions  Review interval abdominal MRI.  Subcapsular and posterior left lobe lesions appear to be hemangiomas.  Unclear significance of right lobe of liver lesion.  Discuss plan for follow-up liver MRI in 3-4 months.   Patient in agreement. 3.   Thoracic spine lesion  Extra-axial 1 cm lesion at T12 of unclear significance.  Discuss dedicated thoracic spine MRI.  Review imaging with Dr Izora Ribas, neurosurgeon- done. 4.   Melanoma left ear  Patent s/p Moh's surgery in 05/2015.  Notes indicate in-situ (Tis) pathology.  No concern for metastatic disease. 5.   Schedule thoracic spine MRI on 05/11/2019. 6.    Schedule abdominal MRI on 08/12/2019. 7.   RTC after thoracic spine MRI for MD assess (Doximity) and review of imaging.  I discussed the assessment and treatment plan with the patient.  The patient was provided an opportunity to ask questions and all were answered.  The patient agreed with the plan and demonstrated an understanding of the instructions.  The patient was advised to call back if the symptoms worsen or if the condition fails to improve as anticipated.   Lizzie Cokley C. Mike Gip, MD, PhD    05/06/2019, 10:28 AM  I, Samul Dada, am acting as scribe for [employer]. Mike Gip, MD, PhD.  I, Waver Dibiasio C. Mike Gip, MD, have reviewed the above documentation for accuracy and completeness, and I agree with the above.

## 2019-05-06 ENCOUNTER — Encounter: Payer: Self-pay | Admitting: Hematology and Oncology

## 2019-05-06 ENCOUNTER — Inpatient Hospital Stay: Payer: 59

## 2019-05-06 ENCOUNTER — Other Ambulatory Visit: Payer: Self-pay

## 2019-05-06 ENCOUNTER — Inpatient Hospital Stay: Payer: 59 | Attending: Hematology and Oncology | Admitting: Hematology and Oncology

## 2019-05-06 VITALS — BP 124/61 | HR 72 | Temp 98.5°F | Wt 225.9 lb

## 2019-05-06 DIAGNOSIS — Z8249 Family history of ischemic heart disease and other diseases of the circulatory system: Secondary | ICD-10-CM | POA: Diagnosis not present

## 2019-05-06 DIAGNOSIS — K769 Liver disease, unspecified: Secondary | ICD-10-CM | POA: Diagnosis present

## 2019-05-06 DIAGNOSIS — R932 Abnormal findings on diagnostic imaging of liver and biliary tract: Secondary | ICD-10-CM | POA: Diagnosis not present

## 2019-05-06 DIAGNOSIS — Z800 Family history of malignant neoplasm of digestive organs: Secondary | ICD-10-CM | POA: Insufficient documentation

## 2019-05-06 DIAGNOSIS — Z9049 Acquired absence of other specified parts of digestive tract: Secondary | ICD-10-CM | POA: Diagnosis not present

## 2019-05-06 DIAGNOSIS — R937 Abnormal findings on diagnostic imaging of other parts of musculoskeletal system: Secondary | ICD-10-CM | POA: Diagnosis not present

## 2019-05-06 DIAGNOSIS — Z85820 Personal history of malignant melanoma of skin: Secondary | ICD-10-CM | POA: Diagnosis present

## 2019-05-06 DIAGNOSIS — D0322 Melanoma in situ of left ear and external auricular canal: Secondary | ICD-10-CM

## 2019-05-06 DIAGNOSIS — Z79899 Other long term (current) drug therapy: Secondary | ICD-10-CM | POA: Diagnosis not present

## 2019-05-06 DIAGNOSIS — Z803 Family history of malignant neoplasm of breast: Secondary | ICD-10-CM | POA: Diagnosis not present

## 2019-05-06 DIAGNOSIS — Z833 Family history of diabetes mellitus: Secondary | ICD-10-CM | POA: Diagnosis not present

## 2019-05-06 DIAGNOSIS — E039 Hypothyroidism, unspecified: Secondary | ICD-10-CM | POA: Diagnosis not present

## 2019-05-06 HISTORY — DX: Liver disease, unspecified: K76.9

## 2019-05-07 LAB — HEPATITIS B CORE ANTIBODY, TOTAL: Hep B Core Total Ab: NONREACTIVE

## 2019-05-07 LAB — AFP TUMOR MARKER: AFP, Serum, Tumor Marker: 3.6 ng/mL (ref 0.0–8.3)

## 2019-05-07 LAB — HEPATITIS C ANTIBODY: HCV Ab: NONREACTIVE

## 2019-05-17 ENCOUNTER — Ambulatory Visit: Payer: 59

## 2019-05-20 ENCOUNTER — Ambulatory Visit: Payer: 59 | Admitting: Hematology and Oncology

## 2019-05-22 ENCOUNTER — Ambulatory Visit: Admission: RE | Admit: 2019-05-22 | Payer: 59 | Source: Ambulatory Visit

## 2019-05-24 ENCOUNTER — Inpatient Hospital Stay: Payer: 59 | Admitting: Hematology and Oncology

## 2019-05-26 ENCOUNTER — Other Ambulatory Visit: Payer: Self-pay

## 2019-05-26 ENCOUNTER — Ambulatory Visit
Admission: RE | Admit: 2019-05-26 | Discharge: 2019-05-26 | Disposition: A | Discharge status: Home / Self Care | Payer: 59 | Source: Ambulatory Visit | Attending: Hematology and Oncology | Admitting: Hematology and Oncology

## 2019-05-26 DIAGNOSIS — R937 Abnormal findings on diagnostic imaging of other parts of musculoskeletal system: Secondary | ICD-10-CM | POA: Diagnosis present

## 2019-05-26 MED ORDER — GADOBUTROL 1 MMOL/ML IV SOLN
8.0000 mL | Freq: Once | INTRAVENOUS | Status: AC | PRN
  Administered 2019-05-26: 20:00:00 8 mL via INTRAVENOUS

## 2019-05-26 NOTE — Progress Notes (Signed)
[facility]  [address], [address] [place], [place] [address] Phone: [phone]  Fax: [phone]   Telemedicine Office Visit:  05/28/2019  Referring physician: Juline Patch, MD  I connected with [name] on 05/28/19 at 3:43 PM by videoconferencing and verified that I was speaking with the correct person using 2 identifiers.  The patient was at home.  I discussed the limitations, risk, security and privacy concerns of performing an evaluation and management service by videoconferencing and the availability of in person appointments.  I also discussed with the patient that there may be a patient responsible charge related to this service.  The patient expressed understanding and agreed to proceed.   Chief Complaint: Karen Franco is a 59 y.o. female with liver lesion and thoracic spine lesion who is seen for assessment after initial consultation and review of imaging  HPI: The patient was last seen in the medical oncology clinic on 05/06/2019. At that time, she felt "great".  She described lower back pain when standing for long periods.  Exam revealed no pain at T12. AFP was 3.6.  Hepatitis B core antibody total and hepatitis C antibody were negative.   Thoracic spine MRI on 05/26/2019 revealed a 1.3 x 1.0 x 1.1 cm extramedullary, intradural mass involving the right aspect of the spinal canal at the level of T12. Primary differential includes a spinal meningioma versus schwannoma or other peripheral nerve sheath tumor.  There was mild mass effect on the adjacent thoracic spinal cord without cord signal changes.  She is scheduled for abdomen MRI w/wo contrast on 08/17/2019.  During the interim, she has had no new symptoms since her last visit. Symptomatically, she is doing the same and voices no concerns.   She has an appointment with Dr. Izora Ribas on 06/03/2019.   Past Medical History:  Diagnosis Date  . Cancer ([place])    melanoma- on left  ear  . Dysrhythmia    palpatations if thyroid meds are "off"  . Hypothyroidism   . Motion sickness    cars - back seat  . Seizures ([place])    22 yrs ago - controlled on Depakote  . Thyroid disease     Past Surgical History:  Procedure Laterality Date  . APPENDECTOMY    . BREAST CYST ASPIRATION     not sure which side-neg  . CESAREAN SECTION     x 2  . CHOLECYSTECTOMY N/A 03/01/2019   Procedure: LAPAROSCOPIC CHOLECYSTECTOMY;  Surgeon: Fredirick Maudlin, MD;  Location: [facility];  Service: General;  Laterality: N/A;  . COLONOSCOPY WITH PROPOFOL N/A 01/20/2015   Procedure: COLONOSCOPY WITH PROPOFOL;  Surgeon: Lucilla Lame, MD;  Location: [facility];  Service: Endoscopy;  Laterality: N/A;  . MOHS SURGERY  05/2015   L) ear  . POLYPECTOMY  01/20/2015   Procedure: POLYPECTOMY;  Surgeon: Lucilla Lame, MD;  Location: [facility];  Service: Endoscopy;;  . THYROIDECTOMY    . VAGINAL HYSTERECTOMY      Family History  Problem Relation Age of Onset  . Cancer Mother   . Heart disease Father   . Diabetes Maternal Grandfather   . Breast cancer Paternal Aunt     Social History:  reports that she has never smoked. She has never used smokeless tobacco. She reports current alcohol use. She reports that she does not use drugs. 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  Participants in the patient's visit and  their role in the encounter included the patient, Samul Dada, scribe, and [employer], RN or [employer], [place], today.  The intake visit was provided by Samul Dada, and Waymon Budge, RN or Vito Berger, [place].    Allergies: No Known Allergies  Current Medications: Current Outpatient Medications  Medication Sig Dispense Refill  . Calcium Carbonate-Vitamin D (CALCIUM-VITAMIN D) 500-200 MG-UNIT per tablet Take 1 tablet by mouth daily.    . cholecalciferol (VITAMIN D) 1000 units tablet Take 1,000 Units by mouth daily.     Marland Kitchen levothyroxine (SYNTHROID) 200 MCG tablet TAKE 1 TABLET BY MOUTH DAILY BEFORE BREAKFAST. TAKE IN PLACE OF 175 MCG 90 tablet 1  . Multiple Vitamin (MULTIVITAMIN) capsule Take 1 capsule by mouth daily.    . vitamin B-12 (CYANOCOBALAMIN) 1000 MCG tablet Take 1,000 mcg by mouth daily.     No current facility-administered medications for this visit.    Review of Systems  Constitutional: Negative.  Negative for chills, diaphoresis, fever, malaise/fatigue and weight loss.       Feels "good".  No concerns.  HENT: Negative.  Negative for congestion, ear discharge, hearing loss, nosebleeds, sore throat and tinnitus.   Eyes: Negative.  Negative for blurred vision and double vision.  Respiratory: Negative.  Negative for cough, hemoptysis, sputum production, shortness of breath and wheezing.   Cardiovascular: Negative.  Negative for chest pain, palpitations, orthopnea, leg swelling and PND.  Gastrointestinal: Positive for diarrhea (s/p cholecystectomy). Negative for abdominal pain, blood in stool, constipation, heartburn, melena, nausea and vomiting.  Genitourinary: Negative.  Negative for dysuria, frequency, hematuria and urgency.  Musculoskeletal: Positive for back pain (burning sensation if stands too long). Negative for falls, joint pain and neck pain.  Skin: Negative.  Negative for rash.  Neurological: Negative.  Negative for dizziness, tingling, sensory change, speech change, focal weakness, weakness and headaches.  Endo/Heme/Allergies: Negative.  Does not bruise/bleed easily.  Psychiatric/Behavioral: Negative.  Negative for depression and memory loss. The patient does not have insomnia.    Performance status (ECOG): 0  Vitals There were no vitals taken for this visit.   Physical Exam  Constitutional: She is oriented to person, place, and time. She appears well-developed and well-nourished. No distress.  HENT:  Head: Atraumatic.  Short gray hair.  Eyes: Conjunctivae and EOM are normal.  No scleral icterus.  Glasses.  Neurological: She is alert and oriented to person, place, and time.  Skin: She is not diaphoretic.  Psychiatric: She has a normal mood and affect. Her behavior is normal. Judgment and thought content normal.  Nursing note reviewed.   No visits with results within 3 Day(s) from this visit.  Latest known visit with results is:  Orders Only on 05/06/2019  Component Date Value Ref Range Status  . AFP, Serum, Tumor Marker 05/06/2019 3.6  0.0 - 8.3 ng/mL Final   Comment: (NOTE) Roche Diagnostics Electrochemiluminescence Immunoassay (ECLIA) Values obtained with different assay methods or kits cannot be used interchangeably.  Results cannot be interpreted as absolute evidence of the presence or absence of malignant disease. This test is not interpretable in pregnant females. Performed At: [facility] [place], [place] [id number] Rush Farmer MD [id number]   . HCV Ab 05/06/2019 NON REACTIVE  NON REACTIVE Final   Comment: (NOTE) Nonreactive HCV antibody screen is consistent with no HCV infections,  unless recent infection is suspected or other evidence exists to indicate HCV infection. Performed at [facility], [place] [address], [place],  [place] [address]   .  Hep B Core Total Ab 05/06/2019 NON REACTIVE  NON REACTIVE Final   Performed at [facility], [place] [address] [place], [place] [address]    Assessment:  Karen Franco is a 59 y.o. female with liver lesions detected on RUQ ultrasound and abdomen and pelvic CT after presenting with cholelithiasis.  She underwent laparoscopic cholecystectomy on 03/01/2019.  Pathology revealed acute cholecystitis and cholelithiasis.  There was a benign cystic duct lymph node.  MRI of abdomen w/wo contrast on 04/23/2019 showed a 3.4 x 3.2 cm subcapsular mass of the peripheral right lobe of the liver, hepatic segment VI, with progressive, discontinuous peripheral nodular  enhancement. A similar mass in the posterior left lobe of the liver measured 1.8 cm. These lesions were c/w benign hepatic hemangiomata. There was a very subtle T2 intermediate lesion of the right lobe of the liver anterior to the first lesion; there was no associated enhancement or diffusion abnormality of this site. This was of uncertain nature, possibly representing a very small focal nodular hyperplasia. Given the reported history of melanoma, metastatic disease is very difficult to strictly exclude. There was a 1.0 cm right eccentric extra-axial nodule within the spinal column at the level of T12 with intrinsic T1 hyperintensity and some contrast enhancement.  This was likely a small meningioma, although a small metastatic lesion could not be strictly excluded. Recommend dedicated contrast enhanced MR examination of the spine to definitively evaluate. She is s/p interval cholecystectomy. There was no biliary ductal dilatation. Consider follow-up examination in 3-6 months to ensure stability.  Labs on 05/06/2019 included negative hepatitis B and C serologies.  AFP was 3.6 (normal).  Thoracic spine MRI on 05/26/2019 revealed a 1.3 x 1.0 x 1.1 cm extramedullary, intradural mass involving the right aspect of the spinal canal at the level of T12. Primary differential includes a spinal meningioma versus schwannoma or other peripheral nerve sheath tumor.  There was mild mass effect on the adjacent thoracic spinal cord without cord signal changes.  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   Symptomatically, she denies any concerns.  Plan: 1.   Review imaging from 05/26/2019 2.   Liver lesions              Abdominal MRI on 04/23/2019 suggested that the subcapsular and posterior left lobe lesions appeared to be hemangiomas.              Unclear significance of right lobe of liver lesion.             Patient is scheduled for follow-upliver MRI on  08/17/2019. 3.   Thoracic spine lesion             Review interval thoracic spine MRI.  Images personally reviewed.  Agree with radiology interpretation.              Suspect mass at T12 is a meningioma, neurofibroma or schwannoma.              Follow-up with Dr Izora Ribas. 4.   Melanoma left ear             Patent s/p Moh's surgery in 05/2015.             Notes indicate in-situ (Tis) pathology.             No concern for metastatic disease. 5.    RTC on 08/19/2019 for MD assessment (Doximetry) after abdominal MRI for review  of imaging.  Addendum:  She was seen by Dr Izora Ribas on 06/03/2019.  The extra medullary mass at the level of T12 was felt most consistent with a meningioma, neurofibroma or schwannoma.  There was no substantial spinal cord mass-effect.  There were no features concerning for malignant behavior.  Plan was for repeat MRI in 3 months.  I discussed the assessment and treatment plan with the patient.  The patient was provided an opportunity to ask questions and all were answered.  The patient agreed with the plan and demonstrated an understanding of the instructions.  The patient was advised to call back if the symptoms worsen or if the condition fails to improve as anticipated.   Lequita Asal, MD, PhD    05/28/2019, 3:50 PM  I, Samul Dada, am acting as a scribe for Lequita Asal, MD.  I, [place] Mike Gip, MD, have reviewed the above documentation for accuracy and completeness, and I agree with the above.

## 2019-05-28 ENCOUNTER — Inpatient Hospital Stay: Payer: 59 | Attending: Hematology and Oncology | Admitting: Hematology and Oncology

## 2019-05-28 ENCOUNTER — Encounter: Payer: Self-pay | Admitting: Hematology and Oncology

## 2019-05-28 DIAGNOSIS — K769 Liver disease, unspecified: Secondary | ICD-10-CM | POA: Diagnosis not present

## 2019-05-28 DIAGNOSIS — R937 Abnormal findings on diagnostic imaging of other parts of musculoskeletal system: Secondary | ICD-10-CM | POA: Diagnosis not present

## 2019-05-28 DIAGNOSIS — Z85820 Personal history of malignant melanoma of skin: Secondary | ICD-10-CM | POA: Diagnosis not present

## 2019-05-28 NOTE — Progress Notes (Signed)
Pt called for my chart video pre assessment. Pt denies any concerns today.  Will be available this afternoon for visit.

## 2019-07-26 ENCOUNTER — Ambulatory Visit: Payer: 59 | Admitting: Family Medicine

## 2019-07-27 ENCOUNTER — Other Ambulatory Visit: Payer: Self-pay

## 2019-07-27 ENCOUNTER — Ambulatory Visit (INDEPENDENT_AMBULATORY_CARE_PROVIDER_SITE_OTHER): Payer: 59 | Admitting: Family Medicine

## 2019-07-27 ENCOUNTER — Encounter: Payer: Self-pay | Admitting: Family Medicine

## 2019-07-27 VITALS — BP 120/60 | HR 60 | Ht 67.0 in | Wt 225.0 lb

## 2019-07-27 DIAGNOSIS — E039 Hypothyroidism, unspecified: Secondary | ICD-10-CM

## 2019-07-27 DIAGNOSIS — R937 Abnormal findings on diagnostic imaging of other parts of musculoskeletal system: Secondary | ICD-10-CM | POA: Diagnosis not present

## 2019-07-27 DIAGNOSIS — Z0289 Encounter for other administrative examinations: Secondary | ICD-10-CM | POA: Diagnosis not present

## 2019-07-27 DIAGNOSIS — R932 Abnormal findings on diagnostic imaging of liver and biliary tract: Secondary | ICD-10-CM | POA: Diagnosis not present

## 2019-07-27 MED ORDER — LEVOTHYROXINE SODIUM 200 MCG PO TABS: ORAL_TABLET | ORAL | 1 refills | Status: DC

## 2019-07-27 NOTE — Progress Notes (Signed)
Date:  07/27/2019   Name:  Karen Franco   DOB:  06/12/1960   MRN:  [id number]   Chief Complaint: Employment Physical and Hypothyroidism  Patient is a 59 year old female who presents for a employment physical exam. The patient reports the following problems: hypothyroid with dose adjustment. Health maintenance has been reviewed colonoscopy pending.  Thyroid Problem Presents for follow-up visit. Symptoms include cold intolerance and leg swelling. Patient reports no anxiety, constipation, depressed mood, diaphoresis, diarrhea, dry skin, fatigue, hair loss, heat intolerance, hoarse voice, menstrual problem, nail problem, palpitations, tremors, visual change, weight gain or weight loss. The symptoms have been stable. Her past medical history is significant for hyperlipidemia. There is no history of diabetes.  Hyperlipidemia This is a chronic problem. The current episode started more than 1 year ago. The problem is controlled. Recent lipid tests were reviewed and are normal. Exacerbating diseases include obesity. She has no history of chronic renal disease, diabetes, hypothyroidism, liver disease or nephrotic syndrome. Pertinent negatives include no chest pain, focal sensory loss, focal weakness, leg pain, myalgias or shortness of breath.    Lab Results  Component Value Date   CREATININE 0.56 03/01/2019   BUN 15 03/01/2019   NA 138 03/01/2019   K 4.4 03/01/2019   CL 104 03/01/2019   CO2 27 03/01/2019   Lab Results  Component Value Date   CHOL 246 (H) 10/07/2017   HDL 76 10/07/2017   LDLCALC 149 (H) 10/07/2017   TRIG 107 10/07/2017   CHOLHDL 3.2 10/07/2017   Lab Results  Component Value Date   TSH 0.096 (L) 04/13/2019   No results found for: HGBA1C Lab Results  Component Value Date   WBC 12.7 (H) 03/01/2019   HGB 14.0 03/01/2019   HCT 42.8 03/01/2019   MCV 87.2 03/01/2019   PLT 397 03/01/2019   Lab Results  Component Value Date   ALT 18 03/01/2019   AST 19  03/01/2019   ALKPHOS 90 03/01/2019   BILITOT 0.5 03/01/2019     Review of Systems  Constitutional: Negative.  Negative for chills, diaphoresis, fatigue, fever, unexpected weight change, weight gain and weight loss.  HENT: Negative for congestion, ear discharge, ear pain, hoarse voice, rhinorrhea, sinus pressure, sneezing and sore throat.   Eyes: Negative for photophobia, pain, discharge, redness and itching.  Respiratory: Negative for cough, shortness of breath, wheezing and stridor.   Cardiovascular: Negative for chest pain and palpitations.  Gastrointestinal: Negative for abdominal pain, blood in stool, constipation, diarrhea, nausea and vomiting.  Endocrine: Positive for cold intolerance. Negative for heat intolerance, polydipsia, polyphagia and polyuria.  Genitourinary: Negative for dysuria, flank pain, frequency, hematuria, menstrual problem, pelvic pain, urgency, vaginal bleeding and vaginal discharge.  Musculoskeletal: Negative for arthralgias, back pain and myalgias.  Skin: Negative for rash.  Allergic/Immunologic: Negative for environmental allergies and food allergies.  Neurological: Negative for dizziness, tremors, focal weakness, weakness, light-headedness, numbness and headaches.  Hematological: Negative for adenopathy. Does not bruise/bleed easily.  Psychiatric/Behavioral: Negative for dysphoric mood. The patient is not nervous/anxious.     Patient Active Problem List   Diagnosis Date Noted  . Liver lesion, right lobe 05/06/2019  . Abnormal MRI, thoracic spine 05/06/2019  . 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 S/P laparoscopic cholecystectomy 03/02/2019  . Acute cholecystitis 03/01/2019  . Special screening for malignant neoplasms, colon   . Benign neoplasm of ascending colon   . Acquired hypothyroidism 12/07/2014  . Taking medication  for chronic disease 12/07/2014    No Known Allergies  Past Surgical History:  Procedure Laterality Date  .  APPENDECTOMY    . BREAST CYST ASPIRATION     not sure which side-neg  . CESAREAN SECTION     x 2  . CHOLECYSTECTOMY N/A 03/01/2019   Procedure: LAPAROSCOPIC CHOLECYSTECTOMY;  Surgeon: Fredirick Maudlin, MD;  Location: [facility];  Service: General;  Laterality: N/A;  . COLONOSCOPY WITH PROPOFOL N/A 01/20/2015   Procedure: COLONOSCOPY WITH PROPOFOL;  Surgeon: Lucilla Lame, MD;  Location: [facility];  Service: Endoscopy;  Laterality: N/A;  . MOHS SURGERY  05/2015   L) ear  . POLYPECTOMY  01/20/2015   Procedure: POLYPECTOMY;  Surgeon: Lucilla Lame, MD;  Location: [facility];  Service: Endoscopy;;  . THYROIDECTOMY    . VAGINAL HYSTERECTOMY      Social History   Tobacco Use  . Smoking status: Never Smoker  . Smokeless tobacco: Never Used  Substance Use Topics  . Alcohol use: Yes    Alcohol/week: 0.0 standard drinks    Comment: 1 glass wine/month  . Drug use: No     Medication list has been reviewed and updated.  Current Meds  Medication Sig  . Calcium Carbonate-Vitamin D (CALCIUM-VITAMIN D) 500-200 MG-UNIT per tablet Take 1 tablet by mouth daily.  . cholecalciferol (VITAMIN D) 1000 units tablet Take 1,000 Units by mouth daily.  Marland Kitchen levothyroxine (SYNTHROID) 200 MCG tablet TAKE 1 TABLET BY MOUTH DAILY BEFORE BREAKFAST. TAKE IN PLACE OF 175 MCG  . Multiple Vitamin (MULTIVITAMIN) capsule Take 1 capsule by mouth daily.  . vitamin B-12 (CYANOCOBALAMIN) 1000 MCG tablet Take 1,000 mcg by mouth daily.    PHQ 2/9 Scores 07/27/2019 04/13/2019 01/23/2018 10/03/2017  PHQ - 2 Score 0 0 0 0  PHQ- 9 Score 0 0 - 0    BP Readings from Last 3 Encounters:  07/27/19 120/60  05/06/19 124/61  04/13/19 120/80    Physical Exam Vitals and nursing note reviewed.  Constitutional:      General: She is not in acute distress.    Appearance: She is not diaphoretic.  HENT:     Head: Normocephalic and atraumatic.     Right Ear: Tympanic membrane, ear canal and external ear normal.     Left  Ear: Tympanic membrane, ear canal and external ear normal.     Nose: Nose normal. No congestion or rhinorrhea.     Mouth/Throat:     Mouth: Mucous membranes are moist.  Eyes:     General:        Right eye: No discharge.        Left eye: No discharge.     Conjunctiva/sclera: Conjunctivae normal.     Pupils: Pupils are equal, round, and reactive to light.  Neck:     Thyroid: No thyromegaly.     Vascular: No JVD.  Cardiovascular:     Rate and Rhythm: Normal rate and regular rhythm.     Pulses: Normal pulses.     Heart sounds: Normal heart sounds. No murmur. No friction rub. No gallop.   Pulmonary:     Effort: Pulmonary effort is normal.     Breath sounds: Normal breath sounds. No wheezing or rhonchi.  Abdominal:     General: Bowel sounds are normal.     Palpations: Abdomen is soft. There is no mass.     Tenderness: There is no abdominal tenderness. There is no guarding.  Musculoskeletal:  General: Normal range of motion.     Cervical back: Normal range of motion and neck supple.  Lymphadenopathy:     Cervical: No cervical adenopathy.  Skin:    General: Skin is warm and dry.  Neurological:     Mental Status: She is alert.     Deep Tendon Reflexes: Reflexes are normal and symmetric.     Wt Readings from Last 3 Encounters:  07/27/19 225 lb (102.1 kg)  05/06/19 225 lb 13.8 oz (102.4 kg)  04/13/19 223 lb (101.2 kg)    BP 120/60   Pulse 60   Ht 5\' 7"  (1.702 m)   Wt 225 lb (102.1 kg)   BMI 35.24 kg/m   Assessment and Plan:  1. Encounter for physical examination related to employment No subjective/objective concerns noted during the history and physical exam.  Most recent labs were reviewed as well as most recent imaging and care elsewhere.Immunizations are reviewed and recommendations provided.   Age appropriate screening tests are discussed. Counseling given for risk factor reduction interventions. - Lipid Panel With LDL/HDL Ratio - Renal Function Panel  2.  Acquired hypothyroidism Chronic.  Controlled.  Stable.  Continue levothyroxine 200 mcg daily.  Will check thyroid function panel with TSH. - Thyroid Panel With TSH - levothyroxine (SYNTHROID) 200 MCG tablet; TAKE 1 TABLET BY MOUTH DAILY BEFORE BREAKFAST.  Dispense: 90 tablet; Refill: 1  3. Abnormal CT of liver Patient is followed for liver cysts/hemangiomas/?  Mass in the liver by Dr. Mike Gip.  Patient is to have repeat scans in the not too distant future.  4. Abnormal MRI, thoracic spine Patient has been followed for a possibility of a meningioma/small number/nerve sheath tumor by Dr. Mike Gip and upcoming thoracic MRI is pending.

## 2019-07-28 ENCOUNTER — Other Ambulatory Visit: Payer: Self-pay

## 2019-07-28 DIAGNOSIS — E782 Mixed hyperlipidemia: Secondary | ICD-10-CM

## 2019-07-28 LAB — LIPID PANEL WITH LDL/HDL RATIO
Cholesterol, Total: 275 mg/dL — ABNORMAL HIGH (ref 100–199)
HDL: 83 mg/dL (ref >39)
LDL Chol Calc (NIH): 180 mg/dL — ABNORMAL HIGH (ref 0–99)
LDL/HDL Ratio: 2.2 ratio (ref 0.0–3.2)
Triglycerides: 74 mg/dL (ref 0–149)
VLDL Cholesterol Cal: 12 mg/dL (ref 5–40)

## 2019-07-28 LAB — RENAL FUNCTION PANEL
Albumin: 4.1 g/dL (ref 3.8–4.9)
BUN/Creatinine Ratio: 16 (ref 9–23)
BUN: 11 mg/dL (ref 6–24)
CO2: 25 mmol/L (ref 20–29)
Calcium: 10.3 mg/dL — ABNORMAL HIGH (ref 8.7–10.2)
Chloride: 103 mmol/L (ref 96–106)
Creatinine, Ser: 0.69 mg/dL (ref 0.57–1.00)
GFR calc Af Amer: 113 mL/min/{1.73_m2} (ref >59)
GFR calc non Af Amer: 98 mL/min/{1.73_m2} (ref >59)
Glucose: 87 mg/dL (ref 65–99)
Phosphorus: 4.1 mg/dL (ref 3.0–4.3)
Potassium: 5.2 mmol/L (ref 3.5–5.2)
Sodium: 141 mmol/L (ref 134–144)

## 2019-07-28 LAB — THYROID PANEL WITH TSH
Free Thyroxine Index: 3.2 (ref 1.2–4.9)
T3 Uptake Ratio: 27 % (ref 24–39)
T4, Total: 12 ug/dL (ref 4.5–12.0)
TSH: 1.07 u[IU]/mL (ref 0.450–4.500)

## 2019-07-28 MED ORDER — EZETIMIBE 10 MG PO TABS: 10.0000 mg | ORAL_TABLET | Freq: Every day | ORAL | 1 refills | Status: DC

## 2019-07-28 NOTE — Progress Notes (Unsigned)
Sent in Rx

## 2019-07-30 ENCOUNTER — Ambulatory Visit: Payer: 59 | Attending: Internal Medicine

## 2019-07-30 DIAGNOSIS — Z23 Encounter for immunization: Secondary | ICD-10-CM

## 2019-07-30 NOTE — Progress Notes (Signed)
   Covid-19 Vaccination Clinic  Name:  Karen Franco    MRN: [id number] DOB: 04/09/1961  07/30/2019  [name] was observed post Covid-19 immunization for 15 minutes without incident. She was provided with Vaccine Information Sheet and instruction to access the V-Safe system.   [name] was instructed to call 911 with any severe reactions post vaccine: Marland Kitchen Difficulty breathing  . Swelling of face and throat  . A fast heartbeat  . A bad rash all over body  . Dizziness and weakness   Immunizations Administered    Name Date Dose VIS Date Route   Pfizer COVID-19 Vaccine 07/30/2019  4:26 PM 0.3 mL 04/23/2019 Intramuscular   Manufacturer: [place]   Lot: [id number]   [place]: [id number]

## 2019-08-16 ENCOUNTER — Ambulatory Visit: Payer: 59

## 2019-08-17 ENCOUNTER — Ambulatory Visit
Admission: RE | Admit: 2019-08-17 | Discharge: 2019-08-17 | Disposition: A | Discharge status: Home / Self Care | Payer: 59 | Source: Ambulatory Visit | Attending: Hematology and Oncology | Admitting: Hematology and Oncology

## 2019-08-17 ENCOUNTER — Other Ambulatory Visit: Payer: Self-pay

## 2019-08-17 DIAGNOSIS — K769 Liver disease, unspecified: Secondary | ICD-10-CM | POA: Diagnosis present

## 2019-08-17 MED ORDER — GADOBUTROL 1 MMOL/ML IV SOLN
10.0000 mL | Freq: Once | INTRAVENOUS | Status: AC | PRN
  Administered 2019-08-17: 16:00:00 10 mL via INTRAVENOUS

## 2019-08-18 NOTE — Progress Notes (Signed)
[facility]  [address] [place], [place] [address] Phone: [phone]  Fax: [phone]   Telemedicine Office Visit:  08/20/2019  Referring physician: Juline Patch, MD  I connected with Karen Franco on 08/20/19 at 3:43 PM by videoconferencing and verified that I was speaking with the correct person using 2 identifiers.  The patient was at home.  I discussed the limitations, risk, security and privacy concerns of performing an evaluation and management service by videoconferencing and the availability of in person appointments.  I also discussed with the patient that there may be a patient responsible charge related to this service.  The patient expressed understanding and agreed to proceed.   Chief Complaint: Karen Franco is a 59 y.o. female with liver lesion and thoracic spine lesion who is seen for review of abdominal MRI and discussion regarding direction of therapy.   HPI: The patient was last seen in the medical oncology clinic on 05/28/2019 via videoconferencing.  At that time, she denied any concerns.  Imaging from 05/26/2019 was reviewed.  Abdominal MRI in 04/2019 suggested that the subcapsular and posterior left lobe lesions appeared to be hemangiomas. The significance of the right lobe of liver lesion was unclear.  Follow-up liver MRI was discussed.  The thoracic spine lesion at T12 was possibly a meningioma, neurofibroma or schwannoma.  She is being followed by Dr Izora Ribas.  Abdomen MRI w/wo contrast on 08/17/2019 revealed new, very subtle arterial phase hyperenhancement associated with a lesion of the right lobe of the liver, hepatic segment VII. This lesion remained indeterminate and had not changed in size at approximately 9 mm although new contrast enhancement was concerning change, particularly given reported history of melanoma which is known to demonstrate hyperenhancement. Differential considerations include benign lesions  such as focal nodular hyperplasia. Benign hemangiomata of the right and left lobes of the liver were unchanged.  Consider characterization for metabolic activity via PET, tissue sampling or follow-up imaging in 3-6 months.  There was unchanged intradural contrast enhancing mass at the right aspect of T11, better characterized by dedicated MR examination of the thoracic spine.  She is scheduled for thoracic spine MRI with contrast on 09/17/2019.   She received the [place] COVID-19 vaccine on 07/30/2019.   Symptomatically, she is doing well today. She has not seen the results for her abdominal MRI; results of the MRI were discussed. She agreed to schedule a follow up for her MRI around October. She still has burning back pain when she stands too long. She sees a Restaurant manager, fast food which helps her. Diarrhea is still present. She denies any other symptoms.    Past Medical History:  Diagnosis Date  . Cancer ([place])    melanoma- on left ear  . Dysrhythmia    palpatations if thyroid meds are "off"  . Hypothyroidism   . Motion sickness    cars - back seat  . Seizures ([place])    22 yrs ago - controlled on Depakote  . Thyroid disease     Past Surgical History:  Procedure Laterality Date  . APPENDECTOMY    . BREAST CYST ASPIRATION     not sure which side-neg  . CESAREAN SECTION     x 2  . CHOLECYSTECTOMY N/A 03/01/2019   Procedure: LAPAROSCOPIC CHOLECYSTECTOMY;  Surgeon: Fredirick Maudlin, MD;  Location: [facility];  Service: General;  Laterality: N/A;  . COLONOSCOPY WITH PROPOFOL N/A 01/20/2015   Procedure: COLONOSCOPY WITH PROPOFOL;  Surgeon: Lucilla Lame, MD;  Location:  [facility];  Service: Endoscopy;  Laterality: N/A;  . MOHS SURGERY  05/2015   L) ear  . POLYPECTOMY  01/20/2015   Procedure: POLYPECTOMY;  Surgeon: Lucilla Lame, MD;  Location: [facility];  Service: Endoscopy;;  . THYROIDECTOMY    . VAGINAL HYSTERECTOMY      Family History  Problem Relation Age of Onset  . Cancer  Mother   . Heart disease Father   . Diabetes Maternal Grandfather   . Breast cancer Paternal Aunt     Social History:  reports that she has never smoked. She has never used smokeless tobacco. She reports current alcohol use. She reports that she does not use drugs. She works at [employer] as a Public affairs consultant.  The patient is alone  today.  Participants in the patient's visit and their role in the encounter included the patient, Heywood Footman, and [employer], CMA, today. The intake visit was provided by Vito Berger, CMA.   Allergies: No Known Allergies  Current Medications: Current Outpatient Medications  Medication Sig Dispense Refill  . Calcium Carbonate-Vitamin D (CALCIUM-VITAMIN D) 500-200 MG-UNIT per tablet Take 1 tablet by mouth daily.    . cholecalciferol (VITAMIN D) 1000 units tablet Take 1,000 Units by mouth daily.    Marland Kitchen ezetimibe (ZETIA) 10 MG tablet Take 1 tablet (10 mg total) by mouth daily. 30 tablet 1  . levothyroxine (SYNTHROID) 200 MCG tablet TAKE 1 TABLET BY MOUTH DAILY BEFORE BREAKFAST. 90 tablet 1  . Multiple Vitamin (MULTIVITAMIN) capsule Take 1 capsule by mouth daily.    . vitamin B-12 (CYANOCOBALAMIN) 1000 MCG tablet Take 1,000 mcg by mouth daily.     No current facility-administered medications for this visit.    Review of Systems  Constitutional: Negative.  Negative for chills, diaphoresis, fever, malaise/fatigue and weight loss.       Feels "good".  No concerns.  HENT: Negative.  Negative for congestion, ear discharge, hearing loss, nosebleeds, sore throat and tinnitus.   Eyes: Negative.  Negative for blurred vision and double vision.  Respiratory: Negative.  Negative for cough, hemoptysis, sputum production, shortness of breath and wheezing.   Cardiovascular: Negative.  Negative for chest pain, palpitations, orthopnea, leg swelling and PND.  Gastrointestinal: Positive for diarrhea (s/p cholecystectomy). Negative for abdominal pain, blood in  stool, constipation, heartburn, melena, nausea and vomiting.  Genitourinary: Negative.  Negative for dysuria, frequency, hematuria and urgency.  Musculoskeletal: Positive for back pain (burning sensation if stands too long). Negative for falls, joint pain and neck pain.  Skin: Negative.  Negative for rash.  Neurological: Negative.  Negative for dizziness, tingling, sensory change, speech change, focal weakness, weakness and headaches.  Endo/Heme/Allergies: Negative.  Does not bruise/bleed easily.  Psychiatric/Behavioral: Negative.  Negative for depression and memory loss. The patient does not have insomnia.    Performance status (ECOG): 0 - Asymptomatic  Vitals There were no vitals taken for this visit.   Physical Exam  Constitutional: She is oriented to person, place, and time. She appears well-developed and well-nourished. No distress.  HENT:  Head: Normocephalic and atraumatic.  Short gray hair.  Eyes: Conjunctivae and EOM are normal. No scleral icterus.  Glasses.  Neurological: She is alert and oriented to person, place, and time.  Skin: She is not diaphoretic.  Psychiatric: She has a normal mood and affect. Her behavior is normal. Judgment and thought content normal.  Nursing note reviewed.   No visits with results within 3 Day(s) from this visit.  Latest known  visit with results is:  Office Visit on 07/27/2019  Component Date Value Ref Range Status  . Cholesterol, Total 07/27/2019 275* 100 - 199 mg/dL Final  . Triglycerides 07/27/2019 74  0 - 149 mg/dL Final  . HDL 07/27/2019 83  >39 mg/dL Final  . VLDL Cholesterol Cal 07/27/2019 12  5 - 40 mg/dL Final  . LDL Chol Calc (NIH) 07/27/2019 180* 0 - 99 mg/dL Final  . LDL/HDL Ratio 07/27/2019 2.2  0.0 - 3.2 ratio Final   Comment:                                     LDL/HDL Ratio                                             Men  Women                               1/2 Avg.Risk  1.0    1.5                                    Avg.Risk  3.6    3.2                                2X Avg.Risk  6.2    5.0                                3X Avg.Risk  8.0    6.1   . TSH 07/27/2019 1.070  0.450 - 4.500 uIU/mL Final  . T4, Total 07/27/2019 12.0  4.5 - 12.0 ug/dL Final  . T3 Uptake Ratio 07/27/2019 27  24 - 39 % Final  . Free Thyroxine Index 07/27/2019 3.2  1.2 - 4.9 Final  . Glucose 07/27/2019 87  65 - 99 mg/dL Final  . BUN 07/27/2019 11  6 - 24 mg/dL Final  . Creatinine, Ser 07/27/2019 0.69  0.57 - 1.00 mg/dL Final  . GFR calc non Af Amer 07/27/2019 98  >59 mL/min/1.73 Final  . GFR calc Af Amer 07/27/2019 113  >59 mL/min/1.73 Final  . BUN/Creatinine Ratio 07/27/2019 16  9 - 23 Final  . Sodium 07/27/2019 141  134 - 144 mmol/L Final  . Potassium 07/27/2019 5.2  3.5 - 5.2 mmol/L Final  . Chloride 07/27/2019 103  96 - 106 mmol/L Final  . CO2 07/27/2019 25  20 - 29 mmol/L Final  . Calcium 07/27/2019 10.3* 8.7 - 10.2 mg/dL Final  . Phosphorus 07/27/2019 4.1  3.0 - 4.3 mg/dL Final  . Albumin 07/27/2019 4.1  3.8 - 4.9 g/dL Final    Assessment:  Karen Franco is a 59 y.o. female with liver lesions detected on RUQ ultrasound and abdomen and pelvic CT after presenting with cholelithiasis.  She underwent laparoscopic cholecystectomy on 03/01/2019.  Pathology revealed acute cholecystitis and cholelithiasis.  There was a benign cystic duct lymph node.  Abdomen MRI w/wo contrast on 04/23/2019 showed a 3.4 x 3.2 cm subcapsular mass of the peripheral right lobe of the  liver, hepatic segment VI, with progressive, discontinuous peripheral nodular enhancement. A similar mass in the posterior left lobe of the liver measured 1.8 cm. These lesions were c/w benign hepatic hemangiomata. There was a very subtle T2 intermediate lesion of the right lobe of the liver anterior to the first lesion; there was no associated enhancement or diffusion abnormality of this site. This was of uncertain nature, possibly representing a very small focal  nodular hyperplasia. Given the reported history of melanoma, metastatic disease is very difficult to strictly exclude. There was a 1.0 cm right eccentric extra-axial nodule within the spinal column at the level of T12 with intrinsic T1 hyperintensity and some contrast enhancement.  This was likely a small meningioma, although a small metastatic lesion could not be strictly excluded. Recommend dedicated contrast enhanced MR examination of the spine to definitively evaluate. She is s/p interval cholecystectomy. There was no biliary ductal dilatation. Consider follow-up examination in 3-6 months to ensure stability.  Abdomen MRI w/wo contrast on 08/17/2019 revealed new, very subtle arterial phase hyperenhancement associated with a lesion of the right lobe of the liver, hepatic segment VII. This lesion remained indeterminate and had not changed in size at approximately 9 mm although new contrast enhancement was concerning change, particularly given reported history of melanoma which is known to demonstrate hyperenhancement. Differential considerations include benign lesions such as focal nodular hyperplasia. Benign hemangiomata of the right and left lobes of the liver were unchanged.  Consider characterization for metabolic activity via PET, tissue sampling or follow-up imaging in 3-6 months.  There was unchanged intradural contrast enhancing mass at the right aspect of T11, better characterized by dedicated MR examination of the thoracic spine.  Labs on 05/06/2019 included negative hepatitis B and C serologies.  AFP was 3.6 (normal).  Thoracic spine MRI on 05/26/2019 revealed a 1.3 x 1.0 x 1.1 cm extramedullary, intradural mass involving the right aspect of the spinal canal at the level of T12. Primary differential includes a spinal meningioma versus schwannoma or other peripheral nerve sheath tumor.  There was mild mass effect on the adjacent thoracic spinal cord without cord signal changes.  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   She received the [place] COVID-19 vaccine on 07/30/2019.   Symptomatically, she experience a burning pain when standing too long.  She denies any abdominal discomfort.  Plan: 1.   Review imaging from 05/26/2019 2.   Liver lesions              Abdominal MRI on 04/23/2019 suggested that the subcapsular and posterior left lobe lesions appeared to be hemangiomas.              Unclear significance of right lobe of liver lesion.             Review abdominal MRI from 08/17/2019.  Images were personally reviewed.  Agree with radiology interpretation.     She has a 9 mm indeterminate lesion in the right lobe of the liver.   Discuss plans for follow-up imaging.  Schedule abdominal MRI with and without contrast on 02/16/2020. 3.   Thoracic spine lesion             Thoracic spine MRI on 05/26/2019 revealed a 1.3 cm extra medullary intradural mass involving the right aspect of the spinal canal at T12.  Differential includes meningioma, neurofibroma or schwannoma.  She is followed by Dr Izora Ribas. 4.   Melanoma left ear             Patent s/p Moh's surgery in 05/2015.             Notes indicate in-situ ([place]) pathology.             No concern for metastatic disease. 5.    RTC after abdominal MRI for MD assessment and review of imaging  I discussed the assessment and treatment plan with the patient.  The patient was provided an opportunity to ask questions and all were answered.  The patient agreed with the plan and demonstrated an understanding of the instructions.  The patient was advised to call back if the symptoms worsen or if the condition fails to improve as anticipated.  I provided 6 minutes (1:50 PM - 1:55 PM) of non-face-to-face video time during this encounter.  I provided these services from the [facility] office.  An additional 4-5 minutes were spent reviewing her chart (Epic and Care Everywhere)  including notes, labs, and imaging studies.    Lequita Asal, MD, PhD    08/20/2019, 1:50 PM  I, Heywood Footman, am acting as a Education administrator for Lequita Asal, MD.  I, [place] Mike Gip, MD, have reviewed the above documentation for accuracy and completeness, and I agree with the above.

## 2019-08-19 ENCOUNTER — Inpatient Hospital Stay: Payer: 59 | Admitting: Hematology and Oncology

## 2019-08-19 ENCOUNTER — Other Ambulatory Visit: Payer: Self-pay

## 2019-08-19 NOTE — Progress Notes (Signed)
Confirmed patient name and DOB for phone assessment. Patient has no questions or concerns at this time

## 2019-08-20 ENCOUNTER — Inpatient Hospital Stay: Payer: 59 | Attending: Hematology and Oncology | Admitting: Hematology and Oncology

## 2019-08-20 DIAGNOSIS — K769 Liver disease, unspecified: Secondary | ICD-10-CM

## 2019-08-20 DIAGNOSIS — R937 Abnormal findings on diagnostic imaging of other parts of musculoskeletal system: Secondary | ICD-10-CM | POA: Diagnosis not present

## 2019-08-25 ENCOUNTER — Ambulatory Visit: Payer: 59 | Attending: Internal Medicine

## 2019-08-25 DIAGNOSIS — Z23 Encounter for immunization: Secondary | ICD-10-CM

## 2019-08-25 NOTE — Progress Notes (Signed)
   Covid-19 Vaccination Clinic  Name:  Karen Franco    MRN: [id number] DOB: 02/16/1961  08/25/2019  Karen Franco was observed post Covid-19 immunization for 15 minutes without incident. She was provided with Vaccine Information Sheet and instruction to access the V-Safe system.   Karen Franco was instructed to call 911 with any severe reactions post vaccine: Marland Kitchen Difficulty breathing  . Swelling of face and throat  . A fast heartbeat  . A bad rash all over body  . Dizziness and weakness   Immunizations Administered    Name Date Dose VIS Date Route   Pfizer COVID-19 Vaccine 08/25/2019  4:20 PM 0.3 mL 04/23/2019 Intramuscular   Manufacturer: [place]   Lot: [id number]   [place]: [id number]

## 2019-09-14 ENCOUNTER — Other Ambulatory Visit: Payer: Self-pay

## 2019-09-14 DIAGNOSIS — R937 Abnormal findings on diagnostic imaging of other parts of musculoskeletal system: Secondary | ICD-10-CM

## 2019-09-17 ENCOUNTER — Ambulatory Visit
Admission: RE | Admit: 2019-09-17 | Discharge: 2019-09-17 | Disposition: A | Discharge status: Home / Self Care | Payer: 59 | Source: Ambulatory Visit | Attending: Hematology and Oncology | Admitting: Hematology and Oncology

## 2019-09-17 ENCOUNTER — Other Ambulatory Visit: Payer: Self-pay

## 2019-09-17 DIAGNOSIS — R937 Abnormal findings on diagnostic imaging of other parts of musculoskeletal system: Secondary | ICD-10-CM | POA: Diagnosis not present

## 2019-09-17 MED ORDER — GADOBUTROL 1 MMOL/ML IV SOLN
10.0000 mL | Freq: Once | INTRAVENOUS | Status: AC | PRN
  Administered 2019-09-17: 10 mL via INTRAVENOUS

## 2019-09-27 ENCOUNTER — Telehealth: Payer: Self-pay | Admitting: Family Medicine

## 2019-09-27 ENCOUNTER — Other Ambulatory Visit: Payer: 59

## 2019-09-27 ENCOUNTER — Other Ambulatory Visit: Payer: Self-pay

## 2019-09-27 DIAGNOSIS — R69 Illness, unspecified: Secondary | ICD-10-CM

## 2019-09-27 DIAGNOSIS — E782 Mixed hyperlipidemia: Secondary | ICD-10-CM

## 2019-09-27 NOTE — Telephone Encounter (Signed)
Printed sheets for lab and left in black box

## 2019-09-27 NOTE — Telephone Encounter (Signed)
Copied from [place] [id number]. Topic: General - Call Back - No Documentation >> Sep 27, 2019  1:38 PM Erick Blinks wrote: Reason for CRM: Pt called requesting a call back from Baxter Flattery, she wants lab orders placed for her to pick up. States she was instructed to leave a VM Best contact: [phone]

## 2019-09-30 ENCOUNTER — Other Ambulatory Visit: Payer: Self-pay

## 2019-09-30 DIAGNOSIS — E782 Mixed hyperlipidemia: Secondary | ICD-10-CM

## 2019-09-30 LAB — LIPID PANEL WITH LDL/HDL RATIO
Cholesterol, Total: 198 mg/dL (ref 100–199)
HDL: 66 mg/dL (ref >39)
LDL Chol Calc (NIH): 119 mg/dL — ABNORMAL HIGH (ref 0–99)
LDL/HDL Ratio: 1.8 ratio (ref 0.0–3.2)
Triglycerides: 71 mg/dL (ref 0–149)
VLDL Cholesterol Cal: 13 mg/dL (ref 5–40)

## 2019-09-30 LAB — HEPATIC FUNCTION PANEL
ALT: 24 IU/L (ref 0–32)
AST: 18 IU/L (ref 0–40)
Albumin: 4.2 g/dL (ref 3.8–4.9)
Alkaline Phosphatase: 124 IU/L — ABNORMAL HIGH (ref 48–121)
Bilirubin Total: <0.2 mg/dL (ref 0.0–1.2)
Bilirubin, Direct: 0.08 mg/dL (ref 0.00–0.40)
Total Protein: 6.7 g/dL (ref 6.0–8.5)

## 2019-09-30 MED ORDER — EZETIMIBE 10 MG PO TABS: 10.0000 mg | ORAL_TABLET | Freq: Every day | ORAL | 5 refills | Status: DC

## 2020-01-27 ENCOUNTER — Ambulatory Visit (INDEPENDENT_AMBULATORY_CARE_PROVIDER_SITE_OTHER): Payer: 59 | Admitting: Family Medicine

## 2020-01-27 ENCOUNTER — Encounter: Payer: Self-pay | Admitting: Family Medicine

## 2020-01-27 ENCOUNTER — Other Ambulatory Visit: Payer: Self-pay

## 2020-01-27 VITALS — BP 110/80 | HR 64 | Ht 67.0 in | Wt 228.0 lb

## 2020-01-27 DIAGNOSIS — E039 Hypothyroidism, unspecified: Secondary | ICD-10-CM | POA: Diagnosis not present

## 2020-01-27 DIAGNOSIS — E782 Mixed hyperlipidemia: Secondary | ICD-10-CM | POA: Diagnosis not present

## 2020-01-27 DIAGNOSIS — Z1211 Encounter for screening for malignant neoplasm of colon: Secondary | ICD-10-CM

## 2020-01-27 DIAGNOSIS — Z23 Encounter for immunization: Secondary | ICD-10-CM

## 2020-01-27 MED ORDER — LEVOTHYROXINE SODIUM 200 MCG PO TABS: ORAL_TABLET | ORAL | 5 refills | Status: DC

## 2020-01-27 MED ORDER — EZETIMIBE 10 MG PO TABS: 10.0000 mg | ORAL_TABLET | Freq: Every day | ORAL | 5 refills | Status: DC

## 2020-01-27 NOTE — Progress Notes (Signed)
Date:  01/27/2020   Name:  Karen Franco   DOB:  1960-10-29   MRN:  [id number]   Chief Complaint: Hyperlipidemia, Hypothyroidism, and Flu Vaccine  Hyperlipidemia This is a chronic problem. The current episode started more than 1 year ago. The problem is controlled. Recent lipid tests were reviewed and are normal. She has no history of chronic renal disease, diabetes, hypothyroidism, liver disease, obesity or nephrotic syndrome. There are no known factors aggravating her hyperlipidemia. Pertinent negatives include no chest pain, focal sensory loss, focal weakness, leg pain, myalgias or shortness of breath. Current antihyperlipidemic treatment includes ezetimibe. The current treatment provides moderate improvement of lipids. There are no compliance problems.   Thyroid Problem Presents for follow-up visit. Patient reports no anxiety, cold intolerance, constipation, depressed mood, diaphoresis, diarrhea, dry skin, fatigue, hair loss, heat intolerance, hoarse voice, leg swelling, menstrual problem, nail problem, palpitations, tremors, visual change, weight gain or weight loss. The symptoms have been stable. Her past medical history is significant for hyperlipidemia. There is no history of diabetes.    Lab Results  Component Value Date   CREATININE 0.69 07/27/2019   BUN 11 07/27/2019   NA 141 07/27/2019   K 5.2 07/27/2019   CL 103 07/27/2019   CO2 25 07/27/2019   Lab Results  Component Value Date   CHOL 198 09/29/2019   HDL 66 09/29/2019   LDLCALC 119 (H) 09/29/2019   TRIG 71 09/29/2019   CHOLHDL 3.2 10/07/2017   Lab Results  Component Value Date   TSH 1.070 07/27/2019   No results found for: HGBA1C Lab Results  Component Value Date   WBC 12.7 (H) 03/01/2019   HGB 14.0 03/01/2019   HCT 42.8 03/01/2019   MCV 87.2 03/01/2019   PLT 397 03/01/2019   Lab Results  Component Value Date   ALT 24 09/29/2019   AST 18 09/29/2019   ALKPHOS 124 (H) 09/29/2019   BILITOT <0.2  09/29/2019     Review of Systems  Constitutional: Negative.  Negative for chills, diaphoresis, fatigue, fever, unexpected weight change, weight gain and weight loss.  HENT: Negative for congestion, ear discharge, ear pain, hoarse voice, rhinorrhea, sinus pressure, sneezing and sore throat.   Eyes: Negative for photophobia, pain, discharge, redness and itching.  Respiratory: Negative for cough, shortness of breath, wheezing and stridor.   Cardiovascular: Negative for chest pain and palpitations.  Gastrointestinal: Negative for abdominal pain, blood in stool, constipation, diarrhea, nausea and vomiting.  Endocrine: Negative for cold intolerance, heat intolerance, polydipsia, polyphagia and polyuria.  Genitourinary: Negative for dysuria, flank pain, frequency, hematuria, menstrual problem, pelvic pain, urgency, vaginal bleeding and vaginal discharge.  Musculoskeletal: Negative for arthralgias, back pain and myalgias.  Skin: Negative for rash.  Allergic/Immunologic: Negative for environmental allergies and food allergies.  Neurological: Negative for dizziness, tremors, focal weakness, weakness, light-headedness, numbness and headaches.  Hematological: Negative for adenopathy. Does not bruise/bleed easily.  Psychiatric/Behavioral: Negative for dysphoric mood. The patient is not nervous/anxious.     Patient Active Problem List   Diagnosis Date Noted  . Liver lesion, right lobe 05/06/2019  . Abnormal MRI, thoracic spine 05/06/2019  . Melanoma in situ of pinna of left ear ([place]) 05/06/2019  . S/P laparoscopic cholecystectomy 03/02/2019  . Acute cholecystitis 03/01/2019  . Special screening for malignant neoplasms, colon   . Benign neoplasm of ascending colon   . Acquired hypothyroidism 12/07/2014  . Taking medication for chronic disease 12/07/2014    No Known Allergies  Past Surgical  History:  Procedure Laterality Date  . APPENDECTOMY    . BREAST CYST ASPIRATION     not sure which  side-neg  . CESAREAN SECTION     x 2  . CHOLECYSTECTOMY N/A 03/01/2019   Procedure: LAPAROSCOPIC CHOLECYSTECTOMY;  Surgeon: Fredirick Maudlin, MD;  Location: [facility];  Service: General;  Laterality: N/A;  . COLONOSCOPY WITH PROPOFOL N/A 01/20/2015   Procedure: COLONOSCOPY WITH PROPOFOL;  Surgeon: Lucilla Lame, MD;  Location: [facility];  Service: Endoscopy;  Laterality: N/A;  . MOHS SURGERY  05/2015   L) ear  . POLYPECTOMY  01/20/2015   Procedure: POLYPECTOMY;  Surgeon: Lucilla Lame, MD;  Location: [facility];  Service: Endoscopy;;  . THYROIDECTOMY    . VAGINAL HYSTERECTOMY      Social History   Tobacco Use  . Smoking status: Never Smoker  . Smokeless tobacco: Never Used  Vaping Use  . Vaping Use: Never used  Substance Use Topics  . Alcohol use: Yes    Alcohol/week: 0.0 standard drinks    Comment: 1 glass wine/month  . Drug use: No     Medication list has been reviewed and updated.  Current Meds  Medication Sig  . Calcium Carbonate-Vitamin D (CALCIUM-VITAMIN D) 500-200 MG-UNIT per tablet Take 1 tablet by mouth daily.  . cholecalciferol (VITAMIN D) 1000 units tablet Take 1,000 Units by mouth daily.  Marland Kitchen ezetimibe (ZETIA) 10 MG tablet Take 1 tablet (10 mg total) by mouth daily.  Marland Kitchen levothyroxine (SYNTHROID) 200 MCG tablet TAKE 1 TABLET BY MOUTH DAILY BEFORE BREAKFAST.  . Multiple Vitamin (MULTIVITAMIN) capsule Take 1 capsule by mouth daily.  . vitamin B-12 (CYANOCOBALAMIN) 1000 MCG tablet Take 1,000 mcg by mouth daily.    PHQ 2/9 Scores 01/27/2020 07/27/2019 04/13/2019 01/23/2018  PHQ - 2 Score 0 0 0 0  PHQ- 9 Score 0 0 0 -    GAD 7 : Generalized Anxiety Score 01/27/2020 07/27/2019 04/13/2019 10/12/2018  Nervous, Anxious, on Edge 0 0 0 0  Control/stop worrying 0 0 0 0  Worry too much - different things 0 0 0 0  Trouble relaxing 0 0 0 0  Restless 0 0 0 0  Easily annoyed or irritable 0 0 0 0  Afraid - awful might happen 0 0 0 0  Total GAD 7 Score 0 0 0 0    BP  Readings from Last 3 Encounters:  01/27/20 110/80  07/27/19 120/60  05/06/19 124/61    Physical Exam Vitals and nursing note reviewed.  Constitutional:      General: She is not in acute distress.    Appearance: She is not diaphoretic.  HENT:     Head: Normocephalic and atraumatic.     Right Ear: Tympanic membrane, ear canal and external ear normal.     Left Ear: Tympanic membrane, ear canal and external ear normal.     Nose: Nose normal. No congestion or rhinorrhea.     Mouth/Throat:     Mouth: Mucous membranes are moist.  Eyes:     General:        Right eye: No discharge.        Left eye: No discharge.     Conjunctiva/sclera: Conjunctivae normal.     Pupils: Pupils are equal, round, and reactive to light.  Neck:     Thyroid: No thyromegaly.     Vascular: No JVD.  Cardiovascular:     Rate and Rhythm: Normal rate and regular rhythm.     Heart  sounds: Normal heart sounds. No murmur heard.  No friction rub. No gallop.   Pulmonary:     Effort: Pulmonary effort is normal.     Breath sounds: No wheezing or rhonchi.  Abdominal:     General: Bowel sounds are normal.     Palpations: Abdomen is soft. There is no mass.     Tenderness: There is no abdominal tenderness. There is no guarding or rebound.  Musculoskeletal:        General: Normal range of motion.     Cervical back: Normal range of motion and neck supple.  Lymphadenopathy:     Cervical: No cervical adenopathy.  Skin:    General: Skin is warm and dry.  Neurological:     Mental Status: She is alert.     Deep Tendon Reflexes: Reflexes are normal and symmetric.     Wt Readings from Last 3 Encounters:  01/27/20 228 lb (103.4 kg)  07/27/19 225 lb (102.1 kg)  05/06/19 225 lb 13.8 oz (102.4 kg)    BP 110/80   Pulse 64   Ht 5\' 7"  (1.702 m)   Wt 228 lb (103.4 kg)   BMI 35.71 kg/m   Assessment and Plan: 1. Acquired hypothyroidism Chronic.  Controlled.  Stable.  Patient is status post thyroidectomy and is  currently controlled on levothyroxine 200 mcg daily.  We will be checking TSH in the near future.- levothyroxine (SYNTHROID) 200 MCG tablet; TAKE 1 TABLET BY MOUTH DAILY BEFORE BREAKFAST.  Dispense: 30 tablet; Refill: 5  2. Mixed hyperlipidemia Chronic.  Controlled.  Stable.  Patient is currently on acetamide 10 mg once a day.  We will be checking lipid panel in the near future. - ezetimibe (ZETIA) 10 MG tablet; Take 1 tablet (10 mg total) by mouth daily.  Dispense: 30 tablet; Refill: 5  3. Colon cancer screening Discussed and referral made to gastroenterology. - Ambulatory referral to Gastroenterology  4. Need for immunization against influenza Discussed and administered - Flu Vaccine QUAD 36+ mos IM

## 2020-02-09 ENCOUNTER — Ambulatory Visit: Payer: 59

## 2020-02-09 ENCOUNTER — Telehealth: Payer: Self-pay | Admitting: Family Medicine

## 2020-02-09 DIAGNOSIS — R748 Abnormal levels of other serum enzymes: Secondary | ICD-10-CM

## 2020-02-09 DIAGNOSIS — E039 Hypothyroidism, unspecified: Secondary | ICD-10-CM

## 2020-02-09 NOTE — Telephone Encounter (Unsigned)
Copied from [place] [id number]. Topic: General - Other >> Feb 09, 2020  8:22 AM Oneta Rack wrote: Reason for CRM:  patient was advised to call in when she was ready to have labs done, patient requesting a follow up once the lab orders are placed in the "box" at the front desk, patient states Baxter Flattery would know.

## 2020-02-09 NOTE — Telephone Encounter (Signed)
Please put her on sched for lab and hit arrived

## 2020-02-11 ENCOUNTER — Telehealth: Payer: Self-pay

## 2020-02-11 LAB — THYROID PANEL WITH TSH
Free Thyroxine Index: 4 (ref 1.2–4.9)
T3 Uptake Ratio: 33 % (ref 24–39)
T4, Total: 12.2 ug/dL — ABNORMAL HIGH (ref 4.5–12.0)
TSH: 0.127 u[IU]/mL — ABNORMAL LOW (ref 0.450–4.500)

## 2020-02-11 LAB — ALKALINE PHOSPHATASE: Alkaline Phosphatase: 122 IU/L — ABNORMAL HIGH (ref 44–121)

## 2020-02-11 NOTE — Telephone Encounter (Signed)
done

## 2020-02-11 NOTE — Telephone Encounter (Unsigned)
Copied from [place] [id number]. Topic: Quick Communication - Office Called Patient (Clinic Use ONLY) >> Feb 11, 2020  7:43 AM Karen Franco wrote: Reason for CRM Pt is returning tara call concerning blood work results

## 2020-02-14 ENCOUNTER — Other Ambulatory Visit: Payer: Self-pay

## 2020-02-14 ENCOUNTER — Telehealth (INDEPENDENT_AMBULATORY_CARE_PROVIDER_SITE_OTHER): Payer: Self-pay | Admitting: Gastroenterology

## 2020-02-14 DIAGNOSIS — Z860100 Personal history of colon polyps, unspecified: Secondary | ICD-10-CM | POA: Insufficient documentation

## 2020-02-14 MED ORDER — NA SULFATE-K SULFATE-MG SULF 17.5-3.13-1.6 GM/177ML PO SOLN: 1.0000 | Freq: Once | ORAL | 0 refills | Status: AC

## 2020-02-14 NOTE — Progress Notes (Signed)
Gastroenterology Pre-Procedure Review  Request Date: Friday 03/17/20 Requesting Physician: Dr. Allen Norris  PATIENT REVIEW QUESTIONS: The patient responded to the following health history questions as indicated:    1. Are you having any GI issues? no 2. Do you have a personal history of Polyps? yes (Colon polyps noted on 01/20/15 colonoscopy report performed by Dr. Allen Norris) 3. Do you have a family history of Colon Cancer or Polyps? no 4. Diabetes Mellitus? no 5. Joint replacements in the past 12 months?no 6. Major health problems in the past 3 months?no 7. Any artificial heart valves, MVP, or defibrillator?no    MEDICATIONS & ALLERGIES:    Patient reports the following regarding taking any anticoagulation/antiplatelet therapy:   Plavix, Coumadin, Eliquis, Xarelto, Lovenox, Pradaxa, Brilinta, or Effient? no Aspirin? no  Patient confirms/reports the following medications:  Current Outpatient Medications  Medication Sig Dispense Refill   Calcium Carbonate-Vitamin D (CALCIUM-VITAMIN D) 500-200 MG-UNIT per tablet Take 1 tablet by mouth daily.     cholecalciferol (VITAMIN D) 1000 units tablet Take 1,000 Units by mouth daily.     ezetimibe (ZETIA) 10 MG tablet Take 1 tablet (10 mg total) by mouth daily. 30 tablet 5   levothyroxine (SYNTHROID) 200 MCG tablet TAKE 1 TABLET BY MOUTH DAILY BEFORE BREAKFAST. 30 tablet 5   Multiple Vitamin (MULTIVITAMIN) capsule Take 1 capsule by mouth daily.     vitamin B-12 (CYANOCOBALAMIN) 1000 MCG tablet Take 1,000 mcg by mouth daily.     levothyroxine (SYNTHROID) 25 MCG tablet Take 25 mcg by mouth daily. (Patient not taking: Reported on 02/14/2020)     No current facility-administered medications for this visit.    Patient confirms/reports the following allergies:  No Known Allergies  No orders of the defined types were placed in this encounter.   AUTHORIZATION INFORMATION Primary Insurance: 1D#: Group #:  Secondary Insurance: 1D#: Group  #:  SCHEDULE INFORMATION: Date: 03/17/20 Time: Location:[facility]

## 2020-02-16 ENCOUNTER — Encounter: Payer: Self-pay | Admitting: Hematology and Oncology

## 2020-02-16 ENCOUNTER — Ambulatory Visit
Admission: RE | Admit: 2020-02-16 | Discharge: 2020-02-16 | Disposition: A | Discharge status: Home / Self Care | Payer: 59 | Source: Ambulatory Visit | Attending: Hematology and Oncology | Admitting: Hematology and Oncology

## 2020-02-16 ENCOUNTER — Other Ambulatory Visit: Payer: Self-pay

## 2020-02-16 DIAGNOSIS — K769 Liver disease, unspecified: Secondary | ICD-10-CM | POA: Insufficient documentation

## 2020-02-16 MED ORDER — GADOBUTROL 1 MMOL/ML IV SOLN
10.0000 mL | Freq: Once | INTRAVENOUS | Status: AC | PRN
  Administered 2020-02-16: 10 mL via INTRAVENOUS

## 2020-02-16 NOTE — Progress Notes (Signed)
[facility]  [address] [place], [place] [address] Phone: [phone]  Fax: [phone]   Clinic Day:  02/17/2020  Referring physician: Juline Patch, MD  Chief Complaint: Karen Franco is a 59 y.o. female with liver lesion and thoracic spine lesion who is seen for 6 month assessment and review of interval abdominal MRI.  HPI: The patient was last seen in the medical oncology clinic on 08/20/2019 via telemedicine. At that time, she experienced a burning pain when standing too long.  She denied any abdominal discomfort.  Thoracic spine MRI with contrast on 09/17/2019 revealed a 13 x 9 mm intradural extramedullary mass in the right canal at T12 that was size stable from 05/26/2019. In addition to previously listed differential diagnosis, hemangioblastoma was also considered given the prominent intradural vessels.  The patient saw Dr. Izora Ribas on 09/23/2019. She had no new symptoms. The lesion at T12 had been stable for the past 2 scans. Repeat scan was scheduled for 6 months.  Abdomen MRI with and without contrast on 02/16/2020 revealed no change in small indeterminate mildly enhancing lesion the RIGHT hepatic lobe. Lesion may represent a small hepatic adenoma.  The lesion did not have typical features of melanoma. Stability over 6 months was reassuring. Consider follow-up MRI without contrast in 12 months from current month. There was a large benign hemangioma of the RIGHT hepatic lobe.  There was a stable extradural mass in the central canal as noted in the thoracic MRI 09/17/2019.  The patient is scheduled for a colonoscopy by Dr. Allen Norris on 03/17/2020.  During the interim, she has been good. The burning back pain still comes and goes if she exerts herself too much. Her bowels are back to normal. She is able to eat whatever she wants with no problems.  She sees Dr. Ronnald Ramp every 6 months.   Past Medical History:  Diagnosis Date  . Cancer ([place])     melanoma- on left ear  . Dysrhythmia    palpatations if thyroid meds are "off"  . Hypothyroidism   . Motion sickness    cars - back seat  . Seizures ([place])    22 yrs ago - controlled on Depakote  . Thyroid disease     Past Surgical History:  Procedure Laterality Date  . APPENDECTOMY    . BREAST CYST ASPIRATION     not sure which side-neg  . CESAREAN SECTION     x 2  . CHOLECYSTECTOMY N/A 03/01/2019   Procedure: LAPAROSCOPIC CHOLECYSTECTOMY;  Surgeon: Fredirick Maudlin, MD;  Location: [facility];  Service: General;  Laterality: N/A;  . COLONOSCOPY WITH PROPOFOL N/A 01/20/2015   Procedure: COLONOSCOPY WITH PROPOFOL;  Surgeon: Lucilla Lame, MD;  Location: [facility];  Service: Endoscopy;  Laterality: N/A;  . MOHS SURGERY  05/2015   L) ear  . POLYPECTOMY  01/20/2015   Procedure: POLYPECTOMY;  Surgeon: Lucilla Lame, MD;  Location: [facility];  Service: Endoscopy;;  . THYROIDECTOMY    . VAGINAL HYSTERECTOMY      Family History  Problem Relation Age of Onset  . Cancer Mother   . Heart disease Father   . Diabetes Maternal Grandfather   . Breast cancer Paternal Aunt     Social History:  reports that she has never smoked. She has never used smokeless tobacco. She reports current alcohol use. She reports that she does not use drugs. She works at [employer] as a Public affairs consultant.  The patient is alone  today.  Allergies: No Known Allergies  Current Medications: Current Outpatient Medications  Medication Sig Dispense Refill  . Calcium Carbonate-Vitamin D (CALCIUM-VITAMIN D) 500-200 MG-UNIT per tablet Take 1 tablet by mouth daily.    . cholecalciferol (VITAMIN D) 1000 units tablet Take 1,000 Units by mouth daily.    Marland Kitchen ezetimibe (ZETIA) 10 MG tablet Take 1 tablet (10 mg total) by mouth daily. 30 tablet 5  . levothyroxine (SYNTHROID) 200 MCG tablet TAKE 1 TABLET BY MOUTH DAILY BEFORE BREAKFAST. 30 tablet 5  . Multiple Vitamin (MULTIVITAMIN) capsule Take 1 capsule by  mouth daily.    . vitamin B-12 (CYANOCOBALAMIN) 1000 MCG tablet Take 1,000 mcg by mouth daily.    Marland Kitchen levothyroxine (SYNTHROID) 25 MCG tablet Take 25 mcg by mouth daily. (Patient not taking: Reported on 02/14/2020)     No current facility-administered medications for this visit.    Review of Systems  Constitutional: Negative.  Negative for chills, diaphoresis, fever, malaise/fatigue and weight loss (up 4 lbs since 07/2019).       Feels "great".  HENT: Negative.  Negative for congestion, ear discharge, ear pain, hearing loss, nosebleeds, sinus pain, sore throat and tinnitus.   Eyes: Negative.  Negative for blurred vision and double vision.  Respiratory: Negative.  Negative for cough, hemoptysis, sputum production, shortness of breath and wheezing.   Cardiovascular: Negative.  Negative for chest pain, palpitations and leg swelling.  Gastrointestinal: Negative for abdominal pain, blood in stool, constipation, diarrhea, heartburn, melena, nausea and vomiting.  Genitourinary: Negative.  Negative for dysuria, frequency, hematuria and urgency.  Musculoskeletal: Positive for back pain (burning sensation if she exerts herself too much). Negative for falls, joint pain, myalgias and neck pain.  Skin: Negative.  Negative for itching and rash.  Neurological: Negative.  Negative for dizziness, tingling, sensory change, speech change, focal weakness, weakness and headaches.  Endo/Heme/Allergies: Negative.  Does not bruise/bleed easily.  Psychiatric/Behavioral: Negative.  Negative for depression and memory loss. The patient does not have insomnia.   All other systems reviewed and are negative.  Performance status (ECOG): 0 - Asymptomatic  Vitals Blood pressure (!) 142/84, pulse 82, temperature (!) 97.4 F (36.3 C), temperature source Tympanic, weight 229 lb 2.7 oz (103.9 kg), SpO2 100 %.   Physical Exam Vitals and nursing note reviewed.  Constitutional:      General: She is not in acute distress.     Appearance: She is well-developed. She is not diaphoretic.  HENT:     Head: Normocephalic and atraumatic.     Comments: Short hair.    Mouth/Throat:     Mouth: Mucous membranes are moist.     Pharynx: Oropharynx is clear.  Eyes:     General: No scleral icterus.    Extraocular Movements: Extraocular movements intact.     Conjunctiva/sclera: Conjunctivae normal.     Pupils: Pupils are equal, round, and reactive to light.     Comments: Blue eyes.  Cardiovascular:     Rate and Rhythm: Normal rate and regular rhythm.     Heart sounds: Normal heart sounds. No murmur heard.   Pulmonary:     Effort: Pulmonary effort is normal. No respiratory distress.     Breath sounds: Normal breath sounds. No wheezing or rales.  Chest:     Chest wall: No tenderness.  Abdominal:     General: Bowel sounds are normal. There is no distension.     Palpations: Abdomen is soft. There is no hepatomegaly, splenomegaly or mass.  Tenderness: There is no abdominal tenderness. There is no guarding or rebound.  Musculoskeletal:        General: No swelling or tenderness. Normal range of motion.     Cervical back: Normal range of motion and neck supple.  Lymphadenopathy:     Head:     Right side of head: No preauricular, posterior auricular or occipital adenopathy.     Left side of head: No preauricular, posterior auricular or occipital adenopathy.     Cervical: No cervical adenopathy.     Upper Body:     Right upper body: No supraclavicular or axillary adenopathy.     Left upper body: No supraclavicular or axillary adenopathy.     Lower Body: No right inguinal adenopathy. No left inguinal adenopathy.  Skin:    General: Skin is warm and dry.  Neurological:     Mental Status: She is alert and oriented to person, place, and time.  Psychiatric:        Behavior: Behavior normal.        Thought Content: Thought content normal.        Judgment: Judgment normal.    No visits with results within 3 Day(s) from  this visit.  Latest known visit with results is:  Clinical Support on 02/09/2020  Component Date Value Ref Range Status  . Alkaline Phosphatase 02/10/2020 122* 44 - 121 IU/L Final                 **Please note reference interval change**  . TSH 02/10/2020 0.127* 0.450 - 4.500 uIU/mL Final  . T4, Total 02/10/2020 12.2* 4.5 - 12.0 ug/dL Final  . T3 Uptake Ratio 02/10/2020 33  24 - 39 % Final  . Free Thyroxine Index 02/10/2020 4.0  1.2 - 4.9 Final    Assessment:  Karen Franco is a 59 y.o. female with liver lesions detected on RUQ ultrasound and abdomen and pelvic CT after presenting with cholelithiasis.  She underwent laparoscopic cholecystectomy on 03/01/2019.  Pathology revealed acute cholecystitis and cholelithiasis.  There was a benign cystic duct lymph node.  Abdomen MRI w/wo contrast on 04/23/2019 showed a 3.4 x 3.2 cm subcapsular mass of the peripheral right lobe of the liver, hepatic segment VI, with progressive, discontinuous peripheral nodular enhancement. A similar mass in the posterior left lobe of the liver measured 1.8 cm. These lesions were c/w benign hepatic hemangiomata. There was a very subtle T2 intermediate lesion of the right lobe of the liver anterior to the first lesion; there was no associated enhancement or diffusion abnormality of this site. This was of uncertain nature, possibly representing a very small focal nodular hyperplasia. Given the reported history of melanoma, metastatic disease is very difficult to strictly exclude. There was a 1.0 cm right eccentric extra-axial nodule within the spinal column at the level of T12 with intrinsic T1 hyperintensity and some contrast enhancement.  This was likely a small meningioma, although a small metastatic lesion could not be strictly excluded. Recommend dedicated contrast enhanced MR examination of the spine to definitively evaluate. She is s/p interval cholecystectomy. There was no biliary ductal dilatation. Consider  follow-up examination in 3-6 months to ensure stability.  Abdomen MRI w/wo contrast on 08/17/2019 revealed new, very subtle arterial phase hyperenhancement associated with a lesion of the right lobe of the liver, hepatic segment VII. This lesion remained indeterminate and had not changed in size at approximately 9 mm although new contrast enhancement was concerning change, particularly given reported history of melanoma  which is known to demonstrate hyperenhancement. Differential considerations include benign lesions such as focal nodular hyperplasia. Benign hemangiomata of the right and left lobes of the liver were unchanged.  Consider characterization for metabolic activity via PET, tissue sampling or follow-up imaging in 3-6 months.  There was unchanged intradural contrast enhancing mass at the right aspect of T11, better characterized by dedicated MR examination of the thoracic spine.  Abdomen MRI w/wo contrast on 02/16/2020 revealed no change in small indeterminate mildly enhancing lesion the RIGHT hepatic lobe. Lesion may represent a small hepatic adenoma.  The lesion did not have typical features of melanoma. Stability over 6 months was reassuring. Consider follow-up MRI without contrast in 12 months from current month. There was a large benign hemangioma of the RIGHT hepatic lobe.  There was a stable extradural mass in the central canal as noted in the thoracic MRI 09/17/2019.  Labs on 05/06/2019 included negative hepatitis B and C serologies.  AFP was 3.6 (normal).  Thoracic spine MRI on 05/26/2019 revealed a 1.3 x 1.0 x 1.1 cm extramedullary, intradural mass involving the right aspect of the spinal canal at the level of T12. Primary differential includes a spinal meningioma versus schwannoma or other peripheral nerve sheath tumor.  There was mild mass effect on the adjacent thoracic spinal cord without cord signal changes.  Thoracic spine MRI on 09/17/2019 revealed a 13 x 9 mm intradural  extramedullary mass in the right canal at T12 that was size stable from 05/26/2019. In addition to previously listed differential diagnosis, hemangioblastoma was also considered given the prominent intradural vessels.  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   She received the [place] COVID-19 vaccine on 07/30/2019 and 08/25/2019.  Symptomatically, she feels "great".  She has back pain that comes and goes.  Exam is stable.  Plan: 1.   Liver lesions              Abdominal MRI on 04/23/2019 suggested that the subcapsular and posterior left lobe lesions appeared to be hemangiomas.              Unclear significance of right lobe of liver lesion.             Abdominal MRI on 08/17/2019 revealed a 9 mm indeterminate lesion in the right lobe of the liver.  Review abdominal MRI from 02/16/2020.   No change in right hepatic lobe lesion.  Discuss plans for follow-up imaging in 12 months.   Patient in agreement. 2.   Thoracic spine lesion             Thoracic spine MRI on 05/26/2019 revealed a 1.3 cm extra medullary intradural mass involving the right aspect of the spinal canal at T12.   Differential includes meningioma, neurofibroma or schwannoma.              Thoracic spine MRI on 09/17/2019 revealed a 1.3 x 0.9 cm intradural mass at T12 (stable).  Continue follow-up with Dr Izora Ribas. 4.   Melanoma left ear             Patent s/p Moh's surgery in 05/2015.             Notes indicate in-situ ([place]) pathology.             No concern for metastatic disease. 5.   Abdomen MRI 02/15/2021. 6.   RTC after abdominal MRI for MD assessment and review of imaging.  I  discussed the assessment and treatment plan with the patient.  The patient was provided an opportunity to ask questions and all were answered.  The patient agreed with the plan and demonstrated an understanding of the instructions.  The patient was advised to call back if the symptoms worsen or  if the condition fails to improve as anticipated.   Lequita Asal, MD, PhD    02/17/2020, 9:33 AM  I, Mirian Mo Tufford, am acting as a Education administrator for Lequita Asal, MD.  I, [place] Mike Gip, MD, have reviewed the above documentation for accuracy and completeness, and I agree with the above.

## 2020-02-16 NOTE — Progress Notes (Signed)
No new changes noted today. The patient Name and DOB has been verified by phone today. 

## 2020-02-17 ENCOUNTER — Encounter: Payer: Self-pay | Admitting: Hematology and Oncology

## 2020-02-17 ENCOUNTER — Inpatient Hospital Stay: Payer: 59 | Attending: Hematology and Oncology | Admitting: Hematology and Oncology

## 2020-02-17 VITALS — BP 142/84 | HR 82 | Temp 97.4°F | Wt 229.2 lb

## 2020-02-17 DIAGNOSIS — R937 Abnormal findings on diagnostic imaging of other parts of musculoskeletal system: Secondary | ICD-10-CM

## 2020-02-17 DIAGNOSIS — Z803 Family history of malignant neoplasm of breast: Secondary | ICD-10-CM | POA: Diagnosis not present

## 2020-02-17 DIAGNOSIS — K769 Liver disease, unspecified: Secondary | ICD-10-CM

## 2020-02-17 DIAGNOSIS — Z8249 Family history of ischemic heart disease and other diseases of the circulatory system: Secondary | ICD-10-CM | POA: Diagnosis not present

## 2020-02-17 DIAGNOSIS — Z79899 Other long term (current) drug therapy: Secondary | ICD-10-CM | POA: Diagnosis not present

## 2020-02-17 DIAGNOSIS — M899 Disorder of bone, unspecified: Secondary | ICD-10-CM | POA: Insufficient documentation

## 2020-02-17 DIAGNOSIS — E039 Hypothyroidism, unspecified: Secondary | ICD-10-CM | POA: Insufficient documentation

## 2020-02-17 DIAGNOSIS — Z85820 Personal history of malignant melanoma of skin: Secondary | ICD-10-CM | POA: Insufficient documentation

## 2020-02-17 DIAGNOSIS — Z833 Family history of diabetes mellitus: Secondary | ICD-10-CM | POA: Insufficient documentation

## 2020-02-22 ENCOUNTER — Other Ambulatory Visit: Payer: Self-pay | Admitting: Neurosurgery

## 2020-02-22 DIAGNOSIS — D497 Neoplasm of unspecified behavior of endocrine glands and other parts of nervous system: Secondary | ICD-10-CM

## 2020-03-13 ENCOUNTER — Encounter: Payer: Self-pay | Admitting: Gastroenterology

## 2020-03-13 ENCOUNTER — Other Ambulatory Visit: Payer: Self-pay

## 2020-03-15 ENCOUNTER — Other Ambulatory Visit
Admission: RE | Admit: 2020-03-15 | Discharge: 2020-03-15 | Disposition: A | Discharge status: Home / Self Care | Payer: 59 | Source: Ambulatory Visit | Attending: Gastroenterology | Admitting: Gastroenterology

## 2020-03-15 ENCOUNTER — Other Ambulatory Visit: Payer: Self-pay

## 2020-03-15 DIAGNOSIS — Z01812 Encounter for preprocedural laboratory examination: Secondary | ICD-10-CM | POA: Insufficient documentation

## 2020-03-15 DIAGNOSIS — Z20822 Contact with and (suspected) exposure to covid-19: Secondary | ICD-10-CM | POA: Insufficient documentation

## 2020-03-15 LAB — SARS CORONAVIRUS 2 (TAT 6-24 HRS): SARS Coronavirus 2: NEGATIVE

## 2020-03-16 NOTE — Discharge Instructions (Signed)
General Anesthesia, Adult, Care After This sheet gives you information about how to care for yourself after your procedure. Your health care provider may also give you more specific instructions. If you have problems or questions, contact your health care provider. What can I expect after the procedure? After the procedure, the following side effects are common:  Pain or discomfort at the IV site.  Nausea.  Vomiting.  Sore throat.  Trouble concentrating.  Feeling cold or chills.  Weak or tired.  Sleepiness and fatigue.  Soreness and body aches. These side effects can affect parts of the body that were not involved in surgery. Follow these instructions at home:  For at least 24 hours after the procedure:  Have a responsible adult stay with you. It is important to have someone help care for you until you are awake and alert.  Rest as needed.  Do not: ? Participate in activities in which you could fall or become injured. ? Drive. ? Use heavy machinery. ? Drink alcohol. ? Take sleeping pills or medicines that cause drowsiness. ? Make important decisions or sign legal documents. ? Take care of children on your own. Eating and drinking  Follow any instructions from your health care provider about eating or drinking restrictions.  When you feel hungry, start by eating small amounts of foods that are soft and easy to digest (bland), such as toast. Gradually return to your regular diet.  Drink enough fluid to keep your urine pale yellow.  If you vomit, rehydrate by drinking water, juice, or clear broth. General instructions  If you have sleep apnea, surgery and certain medicines can increase your risk for breathing problems. Follow instructions from your health care provider about wearing your sleep device: ? Anytime you are sleeping, including during daytime naps. ? While taking prescription pain medicines, sleeping medicines, or medicines that make you drowsy.  Return to  your normal activities as told by your health care provider. Ask your health care provider what activities are safe for you.  Take over-the-counter and prescription medicines only as told by your health care provider.  If you smoke, do not smoke without supervision.  Keep all follow-up visits as told by your health care provider. This is important. Contact a health care provider if:  You have nausea or vomiting that does not get better with medicine.  You cannot eat or drink without vomiting.  You have pain that does not get better with medicine.  You are unable to pass urine.  You develop a skin rash.  You have a fever.  You have redness around your IV site that gets worse. Get help right away if:  You have difficulty breathing.  You have chest pain.  You have blood in your urine or stool, or you vomit blood. Summary  After the procedure, it is common to have a sore throat or nausea. It is also common to feel tired.  Have a responsible adult stay with you for the first 24 hours after general anesthesia. It is important to have someone help care for you until you are awake and alert.  When you feel hungry, start by eating small amounts of foods that are soft and easy to digest (bland), such as toast. Gradually return to your regular diet.  Drink enough fluid to keep your urine pale yellow.  Return to your normal activities as told by your health care provider. Ask your health care provider what activities are safe for you. This information is not   intended to replace advice given to you by your health care provider. Make sure you discuss any questions you have with your health care provider. Document Revised: 05/02/2017 Document Reviewed: 12/13/2016 Elsevier Patient Education  2020 [employer]vier Inc.  

## 2020-03-17 ENCOUNTER — Ambulatory Visit
Admission: RE | Admit: 2020-03-17 | Discharge: 2020-03-17 | Disposition: A | Discharge status: Home / Self Care | Payer: 59 | Attending: Gastroenterology | Admitting: Gastroenterology

## 2020-03-17 ENCOUNTER — Encounter: Payer: Self-pay | Admitting: Gastroenterology

## 2020-03-17 ENCOUNTER — Encounter
Admission: RE | Disposition: A | Discharge status: Home / Self Care | Payer: Self-pay | Source: Home / Self Care | Attending: Gastroenterology

## 2020-03-17 ENCOUNTER — Ambulatory Visit: Payer: 59 | Admitting: Anesthesiology

## 2020-03-17 ENCOUNTER — Other Ambulatory Visit: Payer: Self-pay

## 2020-03-17 DIAGNOSIS — E039 Hypothyroidism, unspecified: Secondary | ICD-10-CM | POA: Insufficient documentation

## 2020-03-17 DIAGNOSIS — I499 Cardiac arrhythmia, unspecified: Secondary | ICD-10-CM | POA: Insufficient documentation

## 2020-03-17 DIAGNOSIS — Z803 Family history of malignant neoplasm of breast: Secondary | ICD-10-CM | POA: Insufficient documentation

## 2020-03-17 DIAGNOSIS — Z8249 Family history of ischemic heart disease and other diseases of the circulatory system: Secondary | ICD-10-CM | POA: Diagnosis not present

## 2020-03-17 DIAGNOSIS — Z833 Family history of diabetes mellitus: Secondary | ICD-10-CM | POA: Insufficient documentation

## 2020-03-17 DIAGNOSIS — D125 Benign neoplasm of sigmoid colon: Secondary | ICD-10-CM | POA: Diagnosis not present

## 2020-03-17 DIAGNOSIS — R002 Palpitations: Secondary | ICD-10-CM | POA: Insufficient documentation

## 2020-03-17 DIAGNOSIS — Z79899 Other long term (current) drug therapy: Secondary | ICD-10-CM | POA: Diagnosis not present

## 2020-03-17 DIAGNOSIS — Z9049 Acquired absence of other specified parts of digestive tract: Secondary | ICD-10-CM | POA: Diagnosis not present

## 2020-03-17 DIAGNOSIS — Z1211 Encounter for screening for malignant neoplasm of colon: Secondary | ICD-10-CM | POA: Insufficient documentation

## 2020-03-17 DIAGNOSIS — Z86010 Personal history of colonic polyps: Secondary | ICD-10-CM | POA: Insufficient documentation

## 2020-03-17 DIAGNOSIS — Z90710 Acquired absence of both cervix and uterus: Secondary | ICD-10-CM | POA: Diagnosis not present

## 2020-03-17 DIAGNOSIS — K635 Polyp of colon: Secondary | ICD-10-CM | POA: Diagnosis not present

## 2020-03-17 HISTORY — PX: POLYPECTOMY: SHX5525

## 2020-03-17 HISTORY — PX: COLONOSCOPY WITH PROPOFOL: SHX5780

## 2020-03-17 SURGERY — COLONOSCOPY WITH PROPOFOL
Anesthesia: General

## 2020-03-17 MED ORDER — ACETAMINOPHEN 325 MG PO TABS: 325.0000 mg | ORAL_TABLET | ORAL | Status: DC | PRN

## 2020-03-17 MED ORDER — PROPOFOL 10 MG/ML IV BOLUS
INTRAVENOUS | Status: DC | PRN
  Administered 2020-03-17: 50 mg via INTRAVENOUS
  Administered 2020-03-17: 100 mg via INTRAVENOUS
  Administered 2020-03-17 (×2): 50 mg via INTRAVENOUS

## 2020-03-17 MED ORDER — LACTATED RINGERS IV SOLN: INTRAVENOUS | Status: DC

## 2020-03-17 MED ORDER — SODIUM CHLORIDE 0.9 % IV SOLN: INTRAVENOUS | Status: DC

## 2020-03-17 MED ORDER — SIMETHICONE 40 MG/0.6ML PO SUSP
ORAL | Status: DC | PRN
  Administered 2020-03-17: 150 mL

## 2020-03-17 MED ORDER — ACETAMINOPHEN 160 MG/5ML PO SOLN: 325.0000 mg | ORAL | Status: DC | PRN

## 2020-03-17 MED ORDER — LIDOCAINE HCL (CARDIAC) PF 100 MG/5ML IV SOSY
PREFILLED_SYRINGE | INTRAVENOUS | Status: DC | PRN
  Administered 2020-03-17: 50 mg via INTRAVENOUS

## 2020-03-17 SURGICAL SUPPLY — 25 items
CLIP HMST 235XBRD CATH ROT (MISCELLANEOUS) IMPLANT
CLIP RESOLUTION 360 11X235 (MISCELLANEOUS)
ELECT REM PT RETURN 9FT ADLT (ELECTROSURGICAL)
ELECTRODE REM PT RTRN 9FT ADLT (ELECTROSURGICAL) IMPLANT
FCP ESCP3.2XJMB 240X2.8X (MISCELLANEOUS)
FORCEPS BIOP RAD 4 LRG CAP 4 (CUTTING FORCEPS) IMPLANT
FORCEPS BIOP RJ4 240 W/NDL (MISCELLANEOUS)
FORCEPS ESCP3.2XJMB 240X2.8X (MISCELLANEOUS) IMPLANT
GOWN CVR UNV OPN BCK APRN NK (MISCELLANEOUS) ×4 IMPLANT
GOWN ISOL THUMB LOOP REG UNIV (MISCELLANEOUS) ×8
INJECTOR VARIJECT VIN23 (MISCELLANEOUS) IMPLANT
KIT DEFENDO VALVE AND CONN (KITS) IMPLANT
KIT PRC NS LF DISP ENDO (KITS) ×2 IMPLANT
KIT PROCEDURE OLYMPUS (KITS) ×4
MANIFOLD NEPTUNE II (INSTRUMENTS) ×4 IMPLANT
MARKER SPOT ENDO TATTOO 5ML (MISCELLANEOUS) IMPLANT
PROBE APC STR FIRE (PROBE) IMPLANT
RETRIEVER NET ROTH 2.5X230 LF (MISCELLANEOUS) IMPLANT
SNARE SHORT THROW 13M SML OVAL (MISCELLANEOUS) IMPLANT
SNARE SHORT THROW 30M LRG OVAL (MISCELLANEOUS) IMPLANT
SNARE SNG USE RND 15MM (INSTRUMENTS) IMPLANT
SPOT EX ENDOSCOPIC TATTOO (MISCELLANEOUS)
TRAP ETRAP POLY (MISCELLANEOUS) IMPLANT
VARIJECT INJECTOR VIN23 (MISCELLANEOUS)
WATER STERILE IRR 250ML POUR (IV SOLUTION) ×4 IMPLANT

## 2020-03-17 NOTE — Anesthesia Postprocedure Evaluation (Signed)
Anesthesia Post Note  Patient: Karen Franco  Procedure(s) Performed: COLONOSCOPY WITH PROPOFOL (N/A ) POLYPECTOMY     Patient location during evaluation: PACU Anesthesia Type: General Level of consciousness: awake and alert Pain management: pain level controlled Vital Signs Assessment: post-procedure vital signs reviewed and stable Respiratory status: spontaneous breathing, nonlabored ventilation, respiratory function stable and patient connected to nasal cannula oxygen Cardiovascular status: blood pressure returned to baseline and stable Postop Assessment: no apparent nausea or vomiting Anesthetic complications: no   No complications documented.  Trecia Rogers

## 2020-03-17 NOTE — Op Note (Signed)
[facility] Gastroenterology Patient Name: Karen Franco Procedure Date: 03/17/2020 9:44 AM MRN: [id number] Account #: [id number] Date of Birth: 1960/12/27 Admit Type: Outpatient Age: 59 Room: [facility] OR ROOM 01 Gender: Female Note Status: Finalized Procedure:             Colonoscopy Indications:           High risk colon cancer surveillance: Personal history                         of colonic polyps Providers:             Lucilla Lame MD, MD Referring MD:          Juline Patch, MD (Referring MD) Medicines:             Propofol per Anesthesia Complications:         No immediate complications. Procedure:             Pre-Anesthesia Assessment:                        - Prior to the procedure, a History and Physical was                         performed, and patient medications and allergies were                         reviewed. The patient's tolerance of previous                         anesthesia was also reviewed. The risks and benefits                         of the procedure and the sedation options and risks                         were discussed with the patient. All questions were                         answered, and informed consent was obtained. Prior                         Anticoagulants: The patient has taken no previous                         anticoagulant or antiplatelet agents. ASA Grade                         Assessment: II - A patient with mild systemic disease.                         After reviewing the risks and benefits, the patient                         was deemed in satisfactory condition to undergo the                         procedure.  After obtaining informed consent, the colonoscope was                         passed under direct vision. Throughout the procedure,                         the patient's blood pressure, pulse, and oxygen                         saturations were monitored continuously. The was                          introduced through the anus and advanced to the the                         cecum, identified by appendiceal orifice and ileocecal                         valve. The colonoscopy was performed without                         difficulty. The patient tolerated the procedure well.                         The quality of the bowel preparation was excellent. Findings:      The perianal and digital rectal examinations were normal.      A 5 mm polyp was found in the sigmoid colon. The polyp was sessile. The       polyp was removed with a cold snare. Resection and retrieval were       complete. Impression:            - One 5 mm polyp in the sigmoid colon, removed with a                         cold snare. Resected and retrieved. Recommendation:        - Discharge patient to home.                        - Resume previous diet.                        - Continue present medications.                        - Repeat colonoscopy in 7 years for surveillance. Procedure Code(s):     --- Professional ---                        [phone], Colonoscopy, flexible; with removal of                         tumor(s), polyp(s), or other lesion(s) by snare                         technique Diagnosis Code(s):     --- Professional ---                        Z86.010, Personal history of colonic polyps  K63.5, Polyp of colon CPT copyright 2019 American Medical Association. All rights reserved. The codes documented in this report are preliminary and upon coder review may  be revised to meet current compliance requirements. Lucilla Lame MD, MD 03/17/2020 10:13:24 AM This report has been signed electronically. Number of Addenda: 0 Note Initiated On: 03/17/2020 9:44 AM Scope Withdrawal Time: 0 hours 10 minutes 53 seconds  Total Procedure Duration: 0 hours 14 minutes 32 seconds  Estimated Blood Loss:  Estimated blood loss: none.      [facility]

## 2020-03-17 NOTE — Anesthesia Procedure Notes (Signed)
Procedure Name: MAC Date/Time: 03/17/2020 9:59 AM Performed by: Jeannene Patella, CRNA Pre-anesthesia Checklist: Patient identified, Emergency Drugs available, Suction available, Timeout performed and Patient being monitored Patient Re-evaluated:Patient Re-evaluated prior to induction Oxygen Delivery Method: Nasal cannula Placement Confirmation: positive ETCO2

## 2020-03-17 NOTE — Anesthesia Preprocedure Evaluation (Signed)
Anesthesia Evaluation  Patient identified by MRN, date of birth, ID band Patient awake    Reviewed: Allergy & Precautions, H&P , NPO status , Patient's Chart, lab work & pertinent test results, reviewed documented beta blocker date and time   Airway Mallampati: I  TM Distance: >3 FB Neck ROM: full    Dental no notable dental hx.    Pulmonary neg pulmonary ROS,    Pulmonary exam normal breath sounds clear to auscultation       Cardiovascular Exercise Tolerance: Good negative cardio ROS Normal cardiovascular exam Rhythm:regular Rate:Normal     Neuro/Psych Seizures -,  On Depakote until 1.5 years ago, no seizures for many years negative psych ROS   GI/Hepatic negative GI ROS, Neg liver ROS,   Endo/Other  Hypothyroidism   Renal/GU negative Renal ROS  negative genitourinary   Musculoskeletal   Abdominal   Peds  Hematology negative hematology ROS (+)   Anesthesia Other Findings   Reproductive/Obstetrics negative OB ROS                             Anesthesia Physical Anesthesia Plan  ASA: II  Anesthesia Plan: General   Post-op Pain Management:    Induction:   PONV Risk Score and Plan:   Airway Management Planned:   Additional Equipment:   Intra-op Plan:   Post-operative Plan:   Informed Consent: I have reviewed the patients History and Physical, chart, labs and discussed the procedure including the risks, benefits and alternatives for the proposed anesthesia with the patient or authorized representative who has indicated his/her understanding and acceptance.     Dental Advisory Given  Plan Discussed with: CRNA  Anesthesia Plan Comments:         Anesthesia Quick Evaluation

## 2020-03-17 NOTE — H&P (Signed)
Lucilla Lame, MD [facility] [address] [place], [place] [address] Phone:[phone] Fax : [phone]  Primary Care Physician:  Juline Patch, MD Primary Gastroenterologist:  Dr. Allen Norris  Pre-Procedure History & Physical: HPI:  Karen Franco is a 59 y.o. female is here for an colonoscopy.   Past Medical History:  Diagnosis Date  . Cancer ([place])    melanoma- on left ear  . Dysrhythmia    palpatations if thyroid meds are "off"  . Hypothyroidism   . Motion sickness    cars - back seat  . Seizures ([place])    22 yrs ago - controlled on Depakote  . Thyroid disease     Past Surgical History:  Procedure Laterality Date  . APPENDECTOMY    . BREAST CYST ASPIRATION     breast cyst/ not sure which side-neg  . CESAREAN SECTION     x 2  . CHOLECYSTECTOMY N/A 03/01/2019   Procedure: LAPAROSCOPIC CHOLECYSTECTOMY;  Surgeon: Fredirick Maudlin, MD;  Location: [facility];  Service: General;  Laterality: N/A;  . COLONOSCOPY WITH PROPOFOL N/A 01/20/2015   Procedure: COLONOSCOPY WITH PROPOFOL;  Surgeon: Lucilla Lame, MD;  Location: [facility];  Service: Endoscopy;  Laterality: N/A;  . MOHS SURGERY  05/2015   L) ear  . POLYPECTOMY  01/20/2015   Procedure: POLYPECTOMY;  Surgeon: Lucilla Lame, MD;  Location: [place];  Service: Endoscopy;;  . THYROIDECTOMY    . VAGINAL HYSTERECTOMY      Prior to Admission medications   Medication Sig Start Date End Date Taking? Authorizing Provider  Calcium Carbonate-Vitamin D (CALCIUM-VITAMIN D) 500-200 MG-UNIT per tablet Take 1 tablet by mouth daily.   Yes [provider]  cholecalciferol (VITAMIN D) 1000 units tablet Take 1,000 Units by mouth daily.   Yes [provider]  ezetimibe (ZETIA) 10 MG tablet Take 1 tablet (10 mg total) by mouth daily. Patient taking differently: Take 10 mg by mouth daily. am 01/27/20  Yes Juline Patch, MD  levothyroxine (SYNTHROID) 200 MCG tablet TAKE 1 TABLET BY MOUTH DAILY BEFORE  BREAKFAST. 01/27/20  Yes Juline Patch, MD  Multiple Vitamin (MULTIVITAMIN) capsule Take 1 capsule by mouth daily.   Yes [provider]  vitamin B-12 (CYANOCOBALAMIN) 1000 MCG tablet Take 1,000 mcg by mouth daily.   Yes [provider]  levothyroxine (SYNTHROID) 25 MCG tablet Take 25 mcg by mouth daily. Patient not taking: Reported on 02/14/2020 11/21/19   [provider]    Allergies as of 02/14/2020  . (No Known Allergies)    Family History  Problem Relation Age of Onset  . Cancer Mother   . Heart disease Father   . Diabetes Maternal Grandfather   . Breast cancer Paternal Aunt     Social History   Socioeconomic History  . Marital status: Married    Spouse name: Not on file  . Number of children: Not on file  . Years of education: Not on file  . Highest education level: Not on file  Occupational History  . Not on file  Tobacco Use  . Smoking status: Never Smoker  . Smokeless tobacco: Never Used  Vaping Use  . Vaping Use: Never used  Substance and Sexual Activity  . Alcohol use: Yes    Alcohol/week: 0.0 standard drinks    Comment: 2 glass wine/month  . Drug use: No  . Sexual activity: Yes  Other Topics Concern  . Not on file  Social History Narrative  .  Not on file   Social Determinants of Health   Financial Resource Strain:   . Difficulty of Paying Living Expenses: Not on file  Food Insecurity:   . Worried About Charity fundraiser in the Last Year: Not on file  . Ran Out of Food in the Last Year: Not on file  Transportation Needs:   . Lack of Transportation (Medical): Not on file  . Lack of Transportation (Non-Medical): Not on file  Physical Activity:   . Days of Exercise per Week: Not on file  . Minutes of Exercise per Session: Not on file  Stress:   . Feeling of Stress : Not on file  Social Connections:   . Frequency of Communication with Friends and Family: Not on file  . Frequency of Social Gatherings with Friends and  Family: Not on file  . Attends Religious Services: Not on file  . Active Member of Clubs or Organizations: Not on file  . Attends Archivist Meetings: Not on file  . Marital Status: Not on file  Intimate Partner Violence:   . Fear of Current or Ex-Partner: Not on file  . Emotionally Abused: Not on file  . Physically Abused: Not on file  . Sexually Abused: Not on file    Review of Systems: See HPI, otherwise negative ROS  Physical Exam: BP 137/71   Pulse 82   Temp (!) 97.2 F (36.2 C) (Temporal)   Resp 16   Ht 5' 7.5" (1.715 m)   Wt 100.7 kg   SpO2 100%   BMI 34.26 kg/m  General:   Alert,  pleasant and cooperative in NAD Head:  Normocephalic and atraumatic. Neck:  Supple; no masses or thyromegaly. Lungs:  Clear throughout to auscultation.    Heart:  Regular rate and rhythm. Abdomen:  Soft, nontender and nondistended. Normal bowel sounds, without guarding, and without rebound.   Neurologic:  Alert and  oriented x4;  grossly normal neurologically.  Impression/Plan: Karen Franco is here for an colonoscopy to be performed for a history of adenomatous polyps on 01/20/2015   Risks, benefits, limitations, and alternatives regarding  colonoscopy have been reviewed with the patient.  Questions have been answered.  All parties agreeable.   Lucilla Lame, MD  03/17/2020, 9:21 AM

## 2020-03-17 NOTE — Transfer of Care (Signed)
Immediate Anesthesia Transfer of Care Note  Patient: Karen Franco  Procedure(s) Performed: COLONOSCOPY WITH PROPOFOL (N/A )  Patient Location: PACU  Anesthesia Type: General  Level of Consciousness: awake, alert  and patient cooperative  Airway and Oxygen Therapy: Patient Spontanous Breathing and Patient connected to supplemental oxygen  Post-op Assessment: Post-op Vital signs reviewed, Patient's Cardiovascular Status Stable, Respiratory Function Stable, Patent Airway and No signs of Nausea or vomiting  Post-op Vital Signs: Reviewed and stable  Complications: No complications documented.

## 2020-03-20 ENCOUNTER — Encounter: Payer: Self-pay | Admitting: Gastroenterology

## 2020-03-20 LAB — SURGICAL PATHOLOGY

## 2020-03-21 ENCOUNTER — Other Ambulatory Visit: Payer: Self-pay

## 2020-03-21 ENCOUNTER — Ambulatory Visit (INDEPENDENT_AMBULATORY_CARE_PROVIDER_SITE_OTHER): Payer: 59 | Admitting: Family Medicine

## 2020-03-21 ENCOUNTER — Encounter: Payer: Self-pay | Admitting: Family Medicine

## 2020-03-21 VITALS — BP 110/68 | HR 80 | Ht 67.5 in | Wt 230.0 lb

## 2020-03-21 DIAGNOSIS — M25562 Pain in left knee: Secondary | ICD-10-CM | POA: Diagnosis not present

## 2020-03-21 MED ORDER — MELOXICAM 15 MG PO TABS: 15.0000 mg | ORAL_TABLET | Freq: Every day | ORAL | 0 refills | Status: DC

## 2020-03-21 NOTE — Patient Instructions (Signed)
Meniscus Tear    A meniscus tear is a knee injury that happens when a piece of the meniscus is torn. The meniscus is a thick, rubbery, wedge-shaped cartilage in the knee. Two menisci are located in each knee. They sit between the upper bone (femur) and lower bone (tibia) that make up the knee joint. Each meniscus acts as a shock absorber for the knee.  A torn meniscus is one of the most common types of knee injuries. This injury can range from mild to severe. Surgery may be needed to repair a severe tear.  What are the causes?  This condition may be caused by any kneeling, squatting, twisting, or pivoting movement. Sports-related injuries are the most common cause. These often occur from:  · Running and stopping suddenly.  ? Changing direction.  ? Being tackled or knocked off your feet.  · Lifting or carrying heavy weights.  As people get older, their menisci get thinner and weaker. In these people, tears can happen more easily, such as from climbing stairs.  What increases the risk?  You are more likely to develop this condition if you:  · Play contact sports.  · Have a job that requires kneeling or squatting.  · Are female.  · Are over 40 years old.  What are the signs or symptoms?  Symptoms of this condition include:  · Knee pain, especially at the side of the knee joint. You may feel pain when the injury occurs, or you may only hear a pop and feel pain later.  · A feeling that your knee is clicking, catching, locking, or giving way.  · Not being able to fully bend or extend your knee.  · Bruising or swelling in your knee.  How is this diagnosed?  This condition may be diagnosed based on your symptoms and a physical exam.  You may also have tests, such as:  · X-rays.  · MRI.  · A procedure to look inside your knee with a narrow surgical telescope (arthroscopy).  You may be referred to a knee specialist (orthopedic surgeon).  How is this treated?  Treatment for this injury depends on the severity of the tear.  Treatment for a mild tear may include:  · Rest.  · Medicine to reduce pain and swelling. This is usually a nonsteroidal anti-inflammatory drug (NSAID), like ibuprofen.  · A knee brace, sleeve, or wrap.  · Using crutches or a walker to keep weight off your knee and to help you walk.  · Exercises to strengthen your knee (physical therapy).  You may need surgery if you have a severe tear or if other treatments are not working.  Follow these instructions at home:  If you have a brace, sleeve, or wrap:  · Wear it as told by your health care provider. Remove it only as told by your health care provider.  · Loosen the brace, sleeve, or wrap if your toes tingle, become numb, or turn cold and blue.  · Keep the brace, sleeve, or wrap clean and dry.  · If the brace, sleeve, or wrap is not waterproof:  ? Do not let it get wet.  ? Cover it with a watertight covering when you take a bath or shower.  Managing pain and swelling    · Take over-the-counter and prescription medicines only as told by your health care provider.  · If directed, put ice on your knee:  ? If you have a removable brace, sleeve, or wrap, remove it   elevate) the injured area above the level of your heart while you are sitting or lying down. Activity  Do not use the injured limb to support your body weight until your health care provider says that you can. Use crutches or a walker as told by your health care provider.  Return to your normal activities as told by your health care provider. Ask your health care provider what activities are safe for you.  Perform range-of-motion exercises only as told by your health care provider.  Begin doing exercises to strengthen your knee and leg muscles only as told by your  health care provider. After you recover, your health care provider may recommend these exercises to help prevent another injury. General instructions  Use a knee brace, sleeve, or wrap as told by your health care provider.  Ask your health care provider when it is safe to drive if you have a brace, sleeve, or wrap on your knee.  Do not use any products that contain nicotine or tobacco, such as cigarettes, e-cigarettes, and chewing tobacco. If you need help quitting, ask your health care provider.  Ask your health care provider if the medicine prescribed to you: ? Requires you to avoid driving or using heavy machinery. ? Can cause constipation. You may need to take these actions to prevent or treat constipation:  Drink enough fluid to keep your urine pale yellow.  Take over-the-counter or prescription medicines.  Eat foods that are high in fiber, such as beans, whole grains, and fresh fruits and vegetables.  Limit foods that are high in fat and processed sugars, such as fried or sweet foods.  Keep all follow-up visits as told by your health care provider. This is important. Contact a health care provider if:  You have a fever.  Your knee becomes red, tender, or swollen.  Your pain medicine is not helping.  Your symptoms get worse or do not improve after 2 weeks of home care. Summary  A meniscus tear is a knee injury that happens when a piece of the meniscus is torn.  Treatment for this injury depends on the severity of the tear. You may need surgery if you have a severe tear or if other treatments are not working.  Rest, ice, and raise (elevate) your injured knee as told by your health care provider. This will help lessen pain and swelling.  Contact a health care provider if you have new symptoms, or your symptoms get worse or do not improve after 2 weeks of home care.  Keep all follow-up visits as told by your health care provider. This is important. This information is not  intended to replace advice given to you by your health care provider. Make sure you discuss any questions you have with your health care provider. Document Revised: 11/11/2017 Document Reviewed: 11/11/2017 Elsevier Patient Education  [place].

## 2020-03-21 NOTE — Progress Notes (Signed)
Date:  03/21/2020   Name:  Karen Franco   DOB:  [birth year]   MRN:  [id number]   Chief Complaint: Knee Pain (R) knee pain- popping and causing pain. 2 months ago was painting and tripped over stool and landed on R) knee)  Knee Pain  The incident occurred more than 1 week ago (incident/3 months ago andpain for 2 weeks). The injury mechanism was a fall. The pain is present in the right knee. The quality of the pain is described as aching. The pain is at a severity of 10/10. The pain is severe. The pain has been intermittent since onset. Pertinent negatives include no inability to bear weight, loss of motion, loss of sensation, muscle weakness, numbness or tingling. The symptoms are aggravated by movement. She has tried nothing for the symptoms.    Lab Results  Component Value Date   CREATININE 0.69 07/27/2019   BUN 11 07/27/2019   NA 141 07/27/2019   K 5.2 07/27/2019   CL 103 07/27/2019   CO2 25 07/27/2019   Lab Results  Component Value Date   CHOL 198 09/29/2019   HDL 66 09/29/2019   LDLCALC 119 (H) 09/29/2019   TRIG 71 09/29/2019   CHOLHDL 3.2 10/07/2017   Lab Results  Component Value Date   TSH 0.127 (L) 02/10/2020   No results found for: HGBA1C Lab Results  Component Value Date   WBC 12.7 (H) 03/01/2019   HGB 14.0 03/01/2019   HCT 42.8 03/01/2019   MCV 87.2 03/01/2019   PLT 397 03/01/2019   Lab Results  Component Value Date   ALT 24 09/29/2019   AST 18 09/29/2019   ALKPHOS 122 (H) 02/10/2020   BILITOT <0.2 09/29/2019     Review of Systems  Constitutional: Negative.  Negative for chills, fatigue, fever and unexpected weight change.  HENT: Negative for congestion, ear discharge, ear pain, rhinorrhea, sinus pressure, sneezing and sore throat.   Eyes: Negative for photophobia, pain, discharge, redness and itching.  Respiratory: Negative for cough, shortness of breath, wheezing and stridor.   Gastrointestinal: Negative for abdominal pain, blood in stool,  constipation, diarrhea, nausea and vomiting.  Endocrine: Negative for cold intolerance, heat intolerance, polydipsia, polyphagia and polyuria.  Genitourinary: Negative for dysuria, flank pain, frequency, hematuria, menstrual problem, pelvic pain, urgency, vaginal bleeding and vaginal discharge.  Musculoskeletal: Negative for arthralgias, back pain and myalgias.  Skin: Negative for rash.  Allergic/Immunologic: Negative for environmental allergies and food allergies.  Neurological: Negative for dizziness, tingling, weakness, light-headedness, numbness and headaches.  Hematological: Negative for adenopathy. Does not bruise/bleed easily.  Psychiatric/Behavioral: Negative for dysphoric mood. The patient is not nervous/anxious.     Patient Active Problem List   Diagnosis Date Noted  . Polyp of sigmoid colon   . History of colonic polyps 02/14/2020  . Liver lesion, right lobe 05/06/2019  . Abnormal MRI, thoracic spine 05/06/2019  . Melanoma in situ of pinna of left ear ([place]) 05/06/2019  . S/P laparoscopic cholecystectomy 03/02/2019  . Acute cholecystitis 03/01/2019  . Special screening for malignant neoplasms, colon   . Benign neoplasm of ascending colon   . Acquired hypothyroidism 12/07/2014  . Taking medication for chronic disease 12/07/2014    No Known Allergies  Past Surgical History:  Procedure Laterality Date  . APPENDECTOMY    . BREAST CYST ASPIRATION     breast cyst/ not sure which side-neg  . CESAREAN SECTION     x 2  . CHOLECYSTECTOMY N/A 03/01/2019  Procedure: LAPAROSCOPIC CHOLECYSTECTOMY;  Surgeon: Fredirick Maudlin, MD;  Location: [facility];  Service: General;  Laterality: N/A;  . COLONOSCOPY WITH PROPOFOL N/A 01/20/2015   Procedure: COLONOSCOPY WITH PROPOFOL;  Surgeon: Lucilla Lame, MD;  Location: [facility];  Service: Endoscopy;  Laterality: N/A;  . COLONOSCOPY WITH PROPOFOL N/A 03/17/2020   Procedure: COLONOSCOPY WITH PROPOFOL;  Surgeon: Lucilla Lame, MD;   Location: [facility];  Service: Endoscopy;  Laterality: N/A;  priority 4  . MOHS SURGERY  05/2015   L) ear  . POLYPECTOMY  01/20/2015   Procedure: POLYPECTOMY;  Surgeon: Lucilla Lame, MD;  Location: [facility];  Service: Endoscopy;;  . POLYPECTOMY  03/17/2020   Procedure: POLYPECTOMY;  Surgeon: Lucilla Lame, MD;  Location: [facility];  Service: Endoscopy;;  . THYROIDECTOMY    . VAGINAL HYSTERECTOMY      Social History   Tobacco Use  . Smoking status: Never Smoker  . Smokeless tobacco: Never Used  Vaping Use  . Vaping Use: Never used  Substance Use Topics  . Alcohol use: Yes    Alcohol/week: 0.0 standard drinks    Comment: 2 glass wine/month  . Drug use: No     Medication list has been reviewed and updated.  Current Meds  Medication Sig  . Calcium Carbonate-Vitamin D (CALCIUM-VITAMIN D) 500-200 MG-UNIT per tablet Take 1 tablet by mouth daily.  . cholecalciferol (VITAMIN D) 1000 units tablet Take 1,000 Units by mouth daily.  Marland Kitchen ezetimibe (ZETIA) 10 MG tablet Take 1 tablet (10 mg total) by mouth daily. (Patient taking differently: Take 10 mg by mouth daily. am)  . levothyroxine (SYNTHROID) 200 MCG tablet TAKE 1 TABLET BY MOUTH DAILY BEFORE BREAKFAST.  . Multiple Vitamin (MULTIVITAMIN) capsule Take 1 capsule by mouth daily.  . vitamin B-12 (CYANOCOBALAMIN) 1000 MCG tablet Take 1,000 mcg by mouth daily.  . [DISCONTINUED] levothyroxine (SYNTHROID) 25 MCG tablet Take 25 mcg by mouth daily.    PHQ 2/9 Scores 03/21/2020 01/27/2020 07/27/2019 04/13/2019  PHQ - 2 Score 0 0 0 0  PHQ- 9 Score 0 0 0 0    GAD 7 : Generalized Anxiety Score 03/21/2020 01/27/2020 07/27/2019 04/13/2019  Nervous, Anxious, on Edge 0 0 0 0  Control/stop worrying 0 0 0 0  Worry too much - different things 0 0 0 0  Trouble relaxing 0 0 0 0  Restless 0 0 0 0  Easily annoyed or irritable 0 0 0 0  Afraid - awful might happen 0 0 0 0  Total GAD 7 Score 0 0 0 0    BP Readings from Last 3  Encounters:  03/21/20 110/68  03/17/20 114/70  02/17/20 (!) 142/84    Physical Exam Vitals and nursing note reviewed.  Constitutional:      General: She is not in acute distress.    Appearance: She is not diaphoretic.  HENT:     Head: Normocephalic and atraumatic.     Right Ear: Tympanic membrane, ear canal and external ear normal. There is no impacted cerumen.     Left Ear: Tympanic membrane, ear canal and external ear normal. There is no impacted cerumen.     Nose: Nose normal.  Eyes:     General:        Right eye: No discharge.        Left eye: No discharge.     Conjunctiva/sclera: Conjunctivae normal.     Pupils: Pupils are equal, round, and reactive to light.  Neck:  Thyroid: No thyromegaly.     Vascular: No carotid bruit or JVD.  Cardiovascular:     Rate and Rhythm: Normal rate and regular rhythm.     Pulses: Normal pulses.     Heart sounds: Normal heart sounds. No murmur heard.  No friction rub. No gallop.   Pulmonary:     Effort: Pulmonary effort is normal.     Breath sounds: Normal breath sounds. No wheezing, rhonchi or rales.  Chest:     Chest wall: No tenderness.  Abdominal:     General: Bowel sounds are normal.     Palpations: Abdomen is soft. There is no mass.     Tenderness: There is no abdominal tenderness. There is no guarding.  Musculoskeletal:        General: Normal range of motion.     Cervical back: Normal range of motion and neck supple. No rigidity or tenderness.     Right knee: Effusion present. Normal range of motion. Tenderness present over the medial joint line and lateral joint line.  Lymphadenopathy:     Cervical: No cervical adenopathy.  Skin:    General: Skin is warm and dry.  Neurological:     Mental Status: She is alert.     Deep Tendon Reflexes: Reflexes are normal and symmetric.     Wt Readings from Last 3 Encounters:  03/21/20 230 lb (104.3 kg)  03/17/20 222 lb (100.7 kg)  02/17/20 229 lb 2.7 oz (103.9 kg)    BP 110/68    Pulse 80   Ht 5' 7.5" (1.715 m)   Wt 230 lb (104.3 kg)   BMI 35.49 kg/m   Assessment and Plan: 1. Acute pain of left knee Patient with remote injury 3 months ago and then 3 weeks ago developed an acute pain which is a 10/10 at times when there is a pop noise.  Patient has tenderness over both the lateral and medial joint lines and with a effusion noted lateral.  This is consistent with a possible meniscal tear and we will initiate Mobic 15 mg once a day as well as referral to orthopedics. - Ambulatory referral to Orthopedic Surgery

## 2020-03-24 ENCOUNTER — Other Ambulatory Visit: Payer: Self-pay

## 2020-03-24 ENCOUNTER — Ambulatory Visit
Admission: RE | Admit: 2020-03-24 | Discharge: 2020-03-24 | Disposition: A | Discharge status: Home / Self Care | Payer: 59 | Source: Ambulatory Visit | Attending: Neurosurgery | Admitting: Neurosurgery

## 2020-03-24 DIAGNOSIS — D497 Neoplasm of unspecified behavior of endocrine glands and other parts of nervous system: Secondary | ICD-10-CM | POA: Diagnosis present

## 2020-03-24 MED ORDER — GADOBUTROL 1 MMOL/ML IV SOLN
10.0000 mL | Freq: Once | INTRAVENOUS | Status: AC | PRN
  Administered 2020-03-24: 10 mL via INTRAVENOUS

## 2020-04-02 ENCOUNTER — Other Ambulatory Visit: Payer: Self-pay | Admitting: Family Medicine

## 2020-04-02 DIAGNOSIS — E782 Mixed hyperlipidemia: Secondary | ICD-10-CM

## 2020-04-19 ENCOUNTER — Other Ambulatory Visit: Payer: Self-pay | Admitting: Orthopedic Surgery

## 2020-04-19 DIAGNOSIS — S8391XD Sprain of unspecified site of right knee, subsequent encounter: Secondary | ICD-10-CM

## 2020-04-19 DIAGNOSIS — M2391 Unspecified internal derangement of right knee: Secondary | ICD-10-CM

## 2020-04-19 DIAGNOSIS — G8929 Other chronic pain: Secondary | ICD-10-CM

## 2020-04-30 ENCOUNTER — Ambulatory Visit
Admission: EM | Admit: 2020-04-30 | Discharge: 2020-04-30 | Disposition: A | Discharge status: Home / Self Care | Payer: 59 | Attending: Family Medicine | Admitting: Family Medicine

## 2020-04-30 ENCOUNTER — Other Ambulatory Visit: Payer: Self-pay

## 2020-04-30 ENCOUNTER — Encounter: Payer: Self-pay | Admitting: Emergency Medicine

## 2020-04-30 DIAGNOSIS — H8110 Benign paroxysmal vertigo, unspecified ear: Secondary | ICD-10-CM

## 2020-04-30 MED ORDER — MECLIZINE HCL 25 MG PO TABS: 25.0000 mg | ORAL_TABLET | Freq: Three times a day (TID) | ORAL | 0 refills | Status: DC | PRN

## 2020-04-30 MED ORDER — ONDANSETRON HCL 4 MG PO TABS: 4.0000 mg | ORAL_TABLET | Freq: Three times a day (TID) | ORAL | 0 refills | Status: DC | PRN

## 2020-04-30 NOTE — ED Provider Notes (Signed)
[facility]    CSN: [id number] Arrival date & time: 04/30/20  1003  History   Chief Complaint Chief Complaint  Patient presents with  . Dizziness   HPI  59 year old female presents with dizziness.  Started abruptly yesterday.  Associated nausea but no emesis.  She feels like the room is spinning.  Worse with head movements and bending over and lying down.  No weakness.  No difficulty with speech.  No vision changes.  She feels very unsteady on her feet.  She has taken some over-the-counter medication with improvement but no resolution.  No other associated symptoms.  No other complaints.  Past Medical History:  Diagnosis Date  . Cancer ([place])    melanoma- on left ear  . Dysrhythmia    palpatations if thyroid meds are "off"  . Hypothyroidism   . Motion sickness    cars - back seat  . Seizures ([place])    22 yrs ago - controlled on Depakote  . Thyroid disease     Patient Active Problem List   Diagnosis Date Noted  . Polyp of sigmoid colon   . History of colonic polyps 02/14/2020  . Liver lesion, right lobe 05/06/2019  . Abnormal MRI, thoracic spine 05/06/2019  . Melanoma in situ of pinna of left ear ([place]) 05/06/2019  . S/P laparoscopic cholecystectomy 03/02/2019  . Acute cholecystitis 03/01/2019  . Special screening for malignant neoplasms, colon   . Benign neoplasm of ascending colon   . Acquired hypothyroidism 12/07/2014  . Taking medication for chronic disease 12/07/2014    Past Surgical History:  Procedure Laterality Date  . APPENDECTOMY    . BREAST CYST ASPIRATION     breast cyst/ not sure which side-neg  . CESAREAN SECTION     x 2  . CHOLECYSTECTOMY N/A 03/01/2019   Procedure: LAPAROSCOPIC CHOLECYSTECTOMY;  Surgeon: Fredirick Maudlin, MD;  Location: [facility];  Service: General;  Laterality: N/A;  . COLONOSCOPY WITH PROPOFOL N/A 01/20/2015   Procedure: COLONOSCOPY WITH PROPOFOL;  Surgeon: Lucilla Lame, MD;  Location: [place];  Service:  Endoscopy;  Laterality: N/A;  . COLONOSCOPY WITH PROPOFOL N/A 03/17/2020   Procedure: COLONOSCOPY WITH PROPOFOL;  Surgeon: Lucilla Lame, MD;  Location: [place];  Service: Endoscopy;  Laterality: N/A;  priority 4  . MOHS SURGERY  05/2015   L) ear  . POLYPECTOMY  01/20/2015   Procedure: POLYPECTOMY;  Surgeon: Lucilla Lame, MD;  Location: [place];  Service: Endoscopy;;  . POLYPECTOMY  03/17/2020   Procedure: POLYPECTOMY;  Surgeon: Lucilla Lame, MD;  Location: [place];  Service: Endoscopy;;  . THYROIDECTOMY    . VAGINAL HYSTERECTOMY      OB History   No obstetric history on file.      Home Medications    Prior to Admission medications   Medication Sig Start Date End Date Taking? Authorizing Provider  Calcium Carbonate-Vitamin D (CALCIUM-VITAMIN D) 500-200 MG-UNIT per tablet Take 1 tablet by mouth daily.   Yes [provider]  cholecalciferol (VITAMIN D) 1000 units tablet Take 1,000 Units by mouth daily.   Yes [provider]  ezetimibe (ZETIA) 10 MG tablet TAKE 1 TABLET(10 MG) BY MOUTH DAILY 04/02/20  Yes Juline Patch, MD  levothyroxine (SYNTHROID) 200 MCG tablet TAKE 1 TABLET BY MOUTH DAILY BEFORE BREAKFAST. 01/27/20  Yes Juline Patch, MD  Multiple Vitamin (MULTIVITAMIN) capsule Take 1 capsule by mouth daily.   Yes [provider]  vitamin B-12 (CYANOCOBALAMIN) 1000 MCG  tablet Take 1,000 mcg by mouth daily.   Yes [provider]  meclizine (ANTIVERT) 25 MG tablet Take 1 tablet (25 mg total) by mouth 3 (three) times daily as needed for dizziness. 04/30/20   Coral Spikes, DO  meloxicam (MOBIC) 15 MG tablet Take 1 tablet (15 mg total) by mouth daily. 03/21/20   Juline Patch, MD  ondansetron (ZOFRAN) 4 MG tablet Take 1 tablet (4 mg total) by mouth every 8 (eight) hours as needed for nausea or vomiting. 04/30/20   Coral Spikes, DO    Family History Family History  Problem Relation Age of Onset  . Cancer Mother    . Heart disease Father   . Diabetes Maternal Grandfather   . Breast cancer Paternal Aunt     Social History Social History   Tobacco Use  . Smoking status: Never Smoker  . Smokeless tobacco: Never Used  Vaping Use  . Vaping Use: Never used  Substance Use Topics  . Alcohol use: Yes    Alcohol/week: 0.0 standard drinks    Comment: 2 glass wine/month  . Drug use: No     Allergies   Patient has no known allergies.   Review of Systems Review of Systems  Gastrointestinal: Positive for nausea.  Neurological: Positive for dizziness.   Physical Exam Triage Vital Signs ED Triage Vitals  Enc Vitals Group     BP 04/30/20 1043 (!) 157/75     Pulse Rate 04/30/20 1043 77     Resp 04/30/20 1043 16     Temp 04/30/20 1043 98.1 F (36.7 C)     Temp Source 04/30/20 1043 Oral     SpO2 04/30/20 1043 100 %     Weight 04/30/20 1040 225 lb (102.1 kg)     Height 04/30/20 1040 5' 7.5" (1.715 m)     Head Circumference --      Peak Flow --      Pain Score 04/30/20 1040 0     Pain Loc --      Pain Edu? --      Excl. in [place]? --    Orthostatic VS for the past 24 hrs:  BP- Lying Pulse- Lying BP- Sitting Pulse- Sitting BP- Standing at 0 minutes Pulse- Standing at 0 minutes  04/30/20 1044 131/65 72 140/80 82 126/80 91    Updated Vital Signs BP (!) 157/75 (BP Location: Left Arm)   Pulse 77   Temp 98.1 F (36.7 C) (Oral)   Resp 16   Ht 5' 7.5" (1.715 m)   Wt 102.1 kg   SpO2 100%   BMI 34.72 kg/m   Visual Acuity Right Eye Distance:   Left Eye Distance:   Bilateral Distance:    Right Eye Near:   Left Eye Near:    Bilateral Near:     Physical Exam Vitals and nursing note reviewed.  Constitutional:      General: She is not in acute distress.    Appearance: Normal appearance. She is not ill-appearing.  HENT:     Head: Normocephalic and atraumatic.  Eyes:     General:        Right eye: No discharge.        Left eye: No discharge.     Conjunctiva/sclera: Conjunctivae  normal.  Cardiovascular:     Rate and Rhythm: Normal rate and regular rhythm.     Heart sounds: No murmur heard.   Pulmonary:     Effort: Pulmonary effort is normal.  Breath sounds: Normal breath sounds. No wheezing, rhonchi or rales.  Neurological:     Mental Status: She is alert.  Psychiatric:        Mood and Affect: Mood normal.        Behavior: Behavior normal.    UC Treatments / Results  Labs (all labs ordered are listed, but only abnormal results are displayed) Labs Reviewed - No data to display  EKG   Radiology No results found.  Procedures Procedures (including critical care time)  Medications Ordered in UC Medications - No data to display  Initial Impression / Assessment and Plan / UC Course  I have reviewed the triage vital signs and the nursing notes.  Pertinent labs & imaging results that were available during my care of the patient were reviewed by me and considered in my medical decision making (see chart for details).    59 year old female presents with BPPV.  Treating with meclizine and Zofran.  Final Clinical Impressions(s) / UC Diagnoses   Final diagnoses:  Benign paroxysmal positional vertigo, unspecified laterality   Discharge Instructions   None    ED Prescriptions    Medication Sig Dispense Auth. Provider   meclizine (ANTIVERT) 25 MG tablet Take 1 tablet (25 mg total) by mouth 3 (three) times daily as needed for dizziness. 30 tablet Noboru Bidinger G, DO   ondansetron (ZOFRAN) 4 MG tablet Take 1 tablet (4 mg total) by mouth every 8 (eight) hours as needed for nausea or vomiting. 20 tablet Coral Spikes, DO     PDMP not reviewed this encounter.   Coral Spikes, [place] 04/30/20 1508

## 2020-04-30 NOTE — ED Triage Notes (Addendum)
Patient c/o dizziness that started yesterday.  Patient reports nausea.  Patient states that movement, bending over or when she turns her head makes things worse.  Patient states that she got her covid booster this past Wed.

## 2020-05-03 ENCOUNTER — Ambulatory Visit
Admission: RE | Admit: 2020-05-03 | Discharge: 2020-05-03 | Disposition: A | Discharge status: Home / Self Care | Payer: 59 | Source: Ambulatory Visit | Attending: Orthopedic Surgery | Admitting: Orthopedic Surgery

## 2020-05-03 ENCOUNTER — Other Ambulatory Visit: Payer: Self-pay

## 2020-05-03 DIAGNOSIS — M2391 Unspecified internal derangement of right knee: Secondary | ICD-10-CM

## 2020-05-03 DIAGNOSIS — G8929 Other chronic pain: Secondary | ICD-10-CM

## 2020-05-03 DIAGNOSIS — S8391XD Sprain of unspecified site of right knee, subsequent encounter: Secondary | ICD-10-CM

## 2020-07-07 ENCOUNTER — Telehealth: Payer: Self-pay

## 2020-07-07 NOTE — Telephone Encounter (Signed)
Copied from [place] [id number]. Topic: General - Other >> Jul 07, 2020 11:49 AM Celene Kras wrote: Reason for CRM: Pt called and is requesting to have orders placed so that she can receive a mammogram . Please advise.

## 2020-07-07 NOTE — Telephone Encounter (Signed)
Please call and schedule a breast exam so we can order the mammo- you can ask her if she has a gynecologist, if not, see if she wants to schedule a physical

## 2020-07-17 ENCOUNTER — Other Ambulatory Visit (HOSPITAL_COMMUNITY)
Admission: RE | Admit: 2020-07-17 | Discharge: 2020-07-17 | Disposition: A | Discharge status: Home / Self Care | Payer: 59 | Source: Ambulatory Visit | Attending: Family Medicine | Admitting: Family Medicine

## 2020-07-17 ENCOUNTER — Other Ambulatory Visit: Payer: Self-pay

## 2020-07-17 ENCOUNTER — Encounter: Payer: Self-pay | Admitting: Family Medicine

## 2020-07-17 ENCOUNTER — Ambulatory Visit (INDEPENDENT_AMBULATORY_CARE_PROVIDER_SITE_OTHER): Payer: 59 | Admitting: Family Medicine

## 2020-07-17 VITALS — BP 120/80 | HR 76 | Ht 67.5 in | Wt 229.0 lb

## 2020-07-17 DIAGNOSIS — Z90710 Acquired absence of both cervix and uterus: Secondary | ICD-10-CM | POA: Insufficient documentation

## 2020-07-17 DIAGNOSIS — Z0000 Encounter for general adult medical examination without abnormal findings: Secondary | ICD-10-CM

## 2020-07-17 DIAGNOSIS — Z1231 Encounter for screening mammogram for malignant neoplasm of breast: Secondary | ICD-10-CM

## 2020-07-17 NOTE — Progress Notes (Signed)
Date:  07/17/2020   Name:  Raymie Trani   DOB:  09/12/1960   MRN:  [id number]   Chief Complaint: Annual Exam  Patient is a 60 year old female who presents for a comprehensive physical exam. The patient reports the following problems: none. Health maintenance has been reviewed up to date.    Lab Results  Component Value Date   CREATININE 0.69 07/27/2019   BUN 11 07/27/2019   NA 141 07/27/2019   K 5.2 07/27/2019   CL 103 07/27/2019   CO2 25 07/27/2019   Lab Results  Component Value Date   CHOL 198 09/29/2019   HDL 66 09/29/2019   LDLCALC 119 (H) 09/29/2019   TRIG 71 09/29/2019   CHOLHDL 3.2 10/07/2017   Lab Results  Component Value Date   TSH 0.127 (L) 02/10/2020   No results found for: HGBA1C Lab Results  Component Value Date   WBC 12.7 (H) 03/01/2019   HGB 14.0 03/01/2019   HCT 42.8 03/01/2019   MCV 87.2 03/01/2019   PLT 397 03/01/2019   Lab Results  Component Value Date   ALT 24 09/29/2019   AST 18 09/29/2019   ALKPHOS 122 (H) 02/10/2020   BILITOT <0.2 09/29/2019     Review of Systems  Constitutional: Negative.  Negative for chills, fatigue, fever and unexpected weight change.  HENT: Negative for congestion, ear discharge, ear pain, rhinorrhea, sinus pressure, sneezing and sore throat.   Eyes: Negative for double vision, photophobia, pain, discharge, redness and itching.  Respiratory: Negative for cough, shortness of breath, wheezing and stridor.   Gastrointestinal: Negative for abdominal pain, blood in stool, constipation, diarrhea, nausea and vomiting.  Endocrine: Negative for cold intolerance, heat intolerance, polydipsia, polyphagia and polyuria.  Genitourinary: Negative for dysuria, flank pain, frequency, hematuria, menstrual problem, pelvic pain, urgency, vaginal bleeding and vaginal discharge.  Musculoskeletal: Negative for arthralgias, back pain and myalgias.  Skin: Negative for rash.  Allergic/Immunologic: Negative for environmental  allergies and food allergies.  Neurological: Negative for dizziness, weakness, light-headedness, numbness and headaches.  Hematological: Negative for adenopathy. Does not bruise/bleed easily.  Psychiatric/Behavioral: Negative for dysphoric mood. The patient is not nervous/anxious.     Patient Active Problem List   Diagnosis Date Noted  . Polyp of sigmoid colon   . History of colonic polyps 02/14/2020  . Liver lesion, right lobe 05/06/2019  . Abnormal MRI, thoracic spine 05/06/2019  . Melanoma in situ of pinna of left ear ([place]) 05/06/2019  . S/P laparoscopic cholecystectomy 03/02/2019  . Acute cholecystitis 03/01/2019  . Special screening for malignant neoplasms, colon   . Benign neoplasm of ascending colon   . Acquired hypothyroidism 12/07/2014  . Taking medication for chronic disease 12/07/2014    No Known Allergies  Past Surgical History:  Procedure Laterality Date  . APPENDECTOMY    . BREAST CYST ASPIRATION     breast cyst/ not sure which side-neg  . CESAREAN SECTION     x 2  . CHOLECYSTECTOMY N/A 03/01/2019   Procedure: LAPAROSCOPIC CHOLECYSTECTOMY;  Surgeon: Fredirick Maudlin, MD;  Location: [facility];  Service: General;  Laterality: N/A;  . COLONOSCOPY WITH PROPOFOL N/A 01/20/2015   Procedure: COLONOSCOPY WITH PROPOFOL;  Surgeon: Lucilla Lame, MD;  Location: [place];  Service: Endoscopy;  Laterality: N/A;  . COLONOSCOPY WITH PROPOFOL N/A 03/17/2020   Procedure: COLONOSCOPY WITH PROPOFOL;  Surgeon: Lucilla Lame, MD;  Location: [place];  Service: Endoscopy;  Laterality: N/A;  priority 4  . MOHS  SURGERY  05/2015   L) ear  . POLYPECTOMY  01/20/2015   Procedure: POLYPECTOMY;  Surgeon: Lucilla Lame, MD;  Location: [place];  Service: Endoscopy;;  . POLYPECTOMY  03/17/2020   Procedure: POLYPECTOMY;  Surgeon: Lucilla Lame, MD;  Location: [place];  Service: Endoscopy;;  . THYROIDECTOMY    . VAGINAL HYSTERECTOMY      Social History    Tobacco Use  . Smoking status: Never Smoker  . Smokeless tobacco: Never Used  Vaping Use  . Vaping Use: Never used  Substance Use Topics  . Alcohol use: Yes    Alcohol/week: 0.0 standard drinks    Comment: 2 glass wine/month  . Drug use: No     Medication list has been reviewed and updated.  Current Meds  Medication Sig  . Calcium Carbonate-Vitamin D (CALCIUM-VITAMIN D) 500-200 MG-UNIT per tablet Take 1 tablet by mouth daily.  . cholecalciferol (VITAMIN D) 1000 units tablet Take 1,000 Units by mouth daily.  Marland Kitchen ezetimibe (ZETIA) 10 MG tablet TAKE 1 TABLET(10 MG) BY MOUTH DAILY  . levothyroxine (SYNTHROID) 200 MCG tablet TAKE 1 TABLET BY MOUTH DAILY BEFORE BREAKFAST.  Marland Kitchen meclizine (ANTIVERT) 25 MG tablet Take 1 tablet (25 mg total) by mouth 3 (three) times daily as needed for dizziness.  . meloxicam (MOBIC) 15 MG tablet Take 1 tablet (15 mg total) by mouth daily.  . Multiple Vitamin (MULTIVITAMIN) capsule Take 1 capsule by mouth daily.  . ondansetron (ZOFRAN) 4 MG tablet Take 1 tablet (4 mg total) by mouth every 8 (eight) hours as needed for nausea or vomiting.  . vitamin B-12 (CYANOCOBALAMIN) 1000 MCG tablet Take 1,000 mcg by mouth daily.    PHQ 2/9 Scores 03/21/2020 01/27/2020 07/27/2019 04/13/2019  PHQ - 2 Score 0 0 0 0  PHQ- 9 Score 0 0 0 0    GAD 7 : Generalized Anxiety Score 03/21/2020 01/27/2020 07/27/2019 04/13/2019  Nervous, Anxious, on Edge 0 0 0 0  Control/stop worrying 0 0 0 0  Worry too much - different things 0 0 0 0  Trouble relaxing 0 0 0 0  Restless 0 0 0 0  Easily annoyed or irritable 0 0 0 0  Afraid - awful might happen 0 0 0 0  Total GAD 7 Score 0 0 0 0    BP Readings from Last 3 Encounters:  07/17/20 120/80  04/30/20 (!) 157/75  03/21/20 110/68    Physical Exam Vitals and nursing note reviewed. Exam conducted with a chaperone present.  Constitutional:      Appearance: She is well-developed, well-groomed, overweight and well-nourished.  HENT:      Head: Normocephalic.     Jaw: There is normal jaw occlusion.     Right Ear: Hearing, tympanic membrane, ear canal and external ear normal. There is no impacted cerumen.     Left Ear: Hearing, tympanic membrane, ear canal and external ear normal. There is no impacted cerumen.     Nose: Nose normal. No congestion or rhinorrhea.     Right Turbinates: Not enlarged or swollen.     Left Turbinates: Not enlarged or swollen.     Mouth/Throat:     Lips: Pink.     Mouth: Oropharynx is clear and moist. Mucous membranes are moist.     Palate: No mass and lesions.     Pharynx: Oropharynx is clear. Uvula midline. No pharyngeal swelling, oropharyngeal exudate, posterior oropharyngeal erythema or uvula swelling.     Tonsils: No tonsillar exudate or  tonsillar abscesses.  Eyes:     General: Lids are everted, no foreign bodies appreciated. No visual field deficit or scleral icterus.       Left eye: No foreign body or hordeolum.     Extraocular Movements: EOM normal.     Conjunctiva/sclera: Conjunctivae normal.     Right eye: Right conjunctiva is not injected.     Left eye: Left conjunctiva is not injected.     Pupils: Pupils are equal, round, and reactive to light.     Funduscopic exam:    Right eye: Red reflex present.        Left eye: Red reflex present. Neck:     Thyroid: No thyroid mass, thyromegaly or thyroid tenderness.     Vascular: Normal carotid pulses. No carotid bruit, hepatojugular reflux or JVD.     Trachea: Trachea and phonation normal. No tracheal deviation.  Cardiovascular:     Rate and Rhythm: Normal rate and regular rhythm.     Chest Wall: PMI is not displaced.     Pulses: Normal pulses and intact distal pulses. No decreased pulses.          Carotid pulses are 2+ on the right side and 2+ on the left side.      Radial pulses are 2+ on the right side and 2+ on the left side.       Femoral pulses are 2+ on the right side and 2+ on the left side.      Popliteal pulses are 2+ on the  right side and 2+ on the left side.       Dorsalis pedis pulses are 2+ on the right side and 2+ on the left side.       Posterior tibial pulses are 2+ on the right side and 2+ on the left side.     Heart sounds: Normal heart sounds, S1 normal and S2 normal. No murmur heard. No friction rub. No gallop. No S3 or S4 sounds.   Pulmonary:     Effort: Pulmonary effort is normal. No respiratory distress.     Breath sounds: Normal breath sounds and air entry. No decreased breath sounds, wheezing, rhonchi or rales.  Chest:     Chest wall: No mass.  Breasts: Breasts are symmetrical.     Right: Normal. No swelling, bleeding, inverted nipple, mass, nipple discharge, skin change, tenderness, axillary adenopathy or supraclavicular adenopathy.     Left: Normal. No swelling, bleeding, inverted nipple, mass, nipple discharge, skin change, tenderness, axillary adenopathy or supraclavicular adenopathy.    Abdominal:     General: Abdomen is flat. Bowel sounds are normal.     Palpations: Abdomen is soft. There is no hepatomegaly, splenomegaly, hepatosplenomegaly or mass.     Tenderness: There is no abdominal tenderness. There is no guarding or rebound.     Hernia: No hernia is present. There is no hernia in the umbilical area, ventral area, left inguinal area or right inguinal area.  Genitourinary:    General: Normal vulva.     Exam position: Lithotomy position.     Labia:        Right: No rash, tenderness, lesion or injury.        Left: No rash, tenderness, lesion or injury.      Vagina: Normal.     Uterus: Absent.      Adnexa: Right adnexa normal and left adnexa normal.     Rectum: Guaiac result negative. No mass, tenderness, external hemorrhoid or internal hemorrhoid.  Normal anal tone.     Comments: Cuff visualized/nonpalpable ovaries Musculoskeletal:        General: No tenderness or edema. Normal range of motion.     Cervical back: Normal, full passive range of motion without pain, normal range of  motion and neck supple. No rigidity or tenderness.     Thoracic back: Normal.     Lumbar back: Normal.     Right lower leg: No edema.     Left lower leg: No edema.  Lymphadenopathy:     Head:     Right side of head: No submental or submandibular adenopathy.     Left side of head: No submental or submandibular adenopathy.     Cervical: No cervical adenopathy.     Right cervical: No superficial, deep or posterior cervical adenopathy.    Left cervical: No superficial, deep or posterior cervical adenopathy.     Upper Body:     Right upper body: No supraclavicular or axillary adenopathy.     Left upper body: No supraclavicular or axillary adenopathy.     Lower Body: No right inguinal adenopathy. No left inguinal adenopathy.  Skin:    General: Skin is warm.     Capillary Refill: Capillary refill takes less than 2 seconds.     Findings: No rash.  Neurological:     General: No focal deficit present.     Mental Status: She is alert and oriented to person, place, and time.     Cranial Nerves: Cranial nerves are intact. No cranial nerve deficit or facial asymmetry.     Sensory: No sensory deficit.     Motor: Motor function is intact. No weakness.     Coordination: Coordination is intact.     Gait: Gait is intact.     Deep Tendon Reflexes: Strength normal and reflexes are normal and symmetric. Reflexes normal.  Psychiatric:        Mood and Affect: Mood and affect normal. Mood is not anxious or depressed.        Behavior: Behavior is cooperative.     Wt Readings from Last 3 Encounters:  07/17/20 229 lb (103.9 kg)  04/30/20 225 lb (102.1 kg)  03/21/20 230 lb (104.3 kg)    BP 120/80   Pulse 76   Ht 5' 7.5" (1.715 m)   Wt 229 lb (103.9 kg)   BMI 35.34 kg/m   Assessment and Plan:  1. Breast cancer screening by mammogram Breast exam was normal with no palpable mass we will order 3 D mammogram bilateral. - MM 3D SCREEN BREAST BILATERAL; Future  2. Annual physical exam Patient  chart was reviewed for previous encounters, most recent lab, most recent imaging and care everywhere.Tausha Milhoan is a 60 y.o. female who presents today for her Complete Annual Exam. She feels well. She reports exercising . She reports she is sleeping well. Immunizations are reviewed and recommendations provided.   Age appropriate screening tests are discussed. Counseling given for risk factor reduction interventions.  We will obtain lab work including lipid panel CMP.  Patient underwent Pap smear of vaginal cuff for dysplasia.  Will obtain lipid panel and CMP. - Lipid Panel With LDL/HDL Ratio - Comprehensive Metabolic Panel (CMET) - Cytology - PAP  3. H/O total hysterectomy Patient with history of total hysterectomy as noted and documented.

## 2020-07-18 LAB — LIPID PANEL WITH LDL/HDL RATIO
Cholesterol, Total: 205 mg/dL — ABNORMAL HIGH (ref 100–199)
HDL: 70 mg/dL (ref >39)
LDL Chol Calc (NIH): 120 mg/dL — ABNORMAL HIGH (ref 0–99)
LDL/HDL Ratio: 1.7 ratio (ref 0.0–3.2)
Triglycerides: 86 mg/dL (ref 0–149)
VLDL Cholesterol Cal: 15 mg/dL (ref 5–40)

## 2020-07-18 LAB — COMPREHENSIVE METABOLIC PANEL
ALT: 16 IU/L (ref 0–32)
AST: 16 IU/L (ref 0–40)
Albumin/Globulin Ratio: 1.7 (ref 1.2–2.2)
Albumin: 4 g/dL (ref 3.8–4.9)
Alkaline Phosphatase: 124 IU/L — ABNORMAL HIGH (ref 44–121)
BUN/Creatinine Ratio: 18 (ref 9–23)
BUN: 12 mg/dL (ref 6–24)
Bilirubin Total: 0.2 mg/dL (ref 0.0–1.2)
CO2: 23 mmol/L (ref 20–29)
Calcium: 9.6 mg/dL (ref 8.7–10.2)
Chloride: 103 mmol/L (ref 96–106)
Creatinine, Ser: 0.67 mg/dL (ref 0.57–1.00)
Globulin, Total: 2.4 g/dL (ref 1.5–4.5)
Glucose: 82 mg/dL (ref 65–99)
Potassium: 4.7 mmol/L (ref 3.5–5.2)
Sodium: 140 mmol/L (ref 134–144)
Total Protein: 6.4 g/dL (ref 6.0–8.5)
eGFR: 102 mL/min/{1.73_m2} (ref >59)

## 2020-07-18 LAB — TSH: TSH: 0.363 u[IU]/mL — ABNORMAL LOW (ref 0.450–4.500)

## 2020-07-20 LAB — CYTOLOGY - PAP
Comment: NEGATIVE
Diagnosis: NEGATIVE
High risk HPV: NEGATIVE

## 2020-07-25 ENCOUNTER — Other Ambulatory Visit: Payer: Self-pay

## 2020-07-25 ENCOUNTER — Ambulatory Visit
Admission: RE | Admit: 2020-07-25 | Discharge: 2020-07-25 | Disposition: A | Discharge status: Home / Self Care | Payer: 59 | Source: Ambulatory Visit | Attending: Family Medicine | Admitting: Family Medicine

## 2020-07-25 DIAGNOSIS — Z1231 Encounter for screening mammogram for malignant neoplasm of breast: Secondary | ICD-10-CM | POA: Diagnosis not present

## 2020-07-26 ENCOUNTER — Ambulatory Visit (INDEPENDENT_AMBULATORY_CARE_PROVIDER_SITE_OTHER): Payer: 59 | Admitting: Family Medicine

## 2020-07-26 ENCOUNTER — Encounter: Payer: Self-pay | Admitting: Family Medicine

## 2020-07-26 VITALS — BP 128/80 | HR 72 | Ht 67.5 in | Wt 235.0 lb

## 2020-07-26 DIAGNOSIS — E039 Hypothyroidism, unspecified: Secondary | ICD-10-CM | POA: Diagnosis not present

## 2020-07-26 DIAGNOSIS — E782 Mixed hyperlipidemia: Secondary | ICD-10-CM

## 2020-07-26 MED ORDER — EZETIMIBE 10 MG PO TABS: ORAL_TABLET | ORAL | 1 refills | Status: DC

## 2020-07-26 MED ORDER — LEVOTHYROXINE SODIUM 200 MCG PO TABS: ORAL_TABLET | ORAL | 5 refills | Status: DC

## 2020-07-26 NOTE — Progress Notes (Signed)
Date:  07/26/2020   Name:  Karen Franco   DOB:  10-02-1960   MRN:  [id number]   Chief Complaint: Hyperlipidemia and Hypothyroidism  Hyperlipidemia This is a chronic problem. The current episode started more than 1 year ago. The problem is controlled. Recent lipid tests were reviewed and are normal. Exacerbating diseases include obesity. She has no history of chronic renal disease, diabetes, hypothyroidism, liver disease or nephrotic syndrome. Pertinent negatives include no chest pain, focal sensory loss, focal weakness, leg pain, myalgias or shortness of breath. Current antihyperlipidemic treatment includes ezetimibe. The current treatment provides moderate improvement of lipids. There are no compliance problems.  Risk factors for coronary artery disease include dyslipidemia and obesity.  Thyroid Problem Presents for follow-up visit. Symptoms include visual change and weight loss. Patient reports no anxiety, cold intolerance, constipation, diarrhea, dry skin, fatigue, hair loss, heat intolerance, hoarse voice, menstrual problem or nail problem. The symptoms have been improving. Her past medical history is significant for hyperlipidemia. There is no history of diabetes.    Lab Results  Component Value Date   CREATININE 0.67 07/17/2020   BUN 12 07/17/2020   NA 140 07/17/2020   K 4.7 07/17/2020   CL 103 07/17/2020   CO2 23 07/17/2020   Lab Results  Component Value Date   CHOL 205 (H) 07/17/2020   HDL 70 07/17/2020   LDLCALC 120 (H) 07/17/2020   TRIG 86 07/17/2020   CHOLHDL 3.2 10/07/2017   Lab Results  Component Value Date   TSH 0.363 (L) 07/17/2020   No results found for: HGBA1C Lab Results  Component Value Date   WBC 12.7 (H) 03/01/2019   HGB 14.0 03/01/2019   HCT 42.8 03/01/2019   MCV 87.2 03/01/2019   PLT 397 03/01/2019   Lab Results  Component Value Date   ALT 16 07/17/2020   AST 16 07/17/2020   ALKPHOS 124 (H) 07/17/2020   BILITOT 0.2 07/17/2020      Review of Systems  Constitutional: Positive for weight loss. Negative for chills, fatigue, fever and unexpected weight change.  HENT: Negative for congestion, ear discharge, ear pain, hoarse voice, rhinorrhea, sinus pressure, sneezing and sore throat.   Eyes: Negative for photophobia, pain, discharge, redness and itching.  Respiratory: Negative for cough, shortness of breath, wheezing and stridor.   Cardiovascular: Negative for chest pain.  Gastrointestinal: Negative for abdominal pain, blood in stool, constipation, diarrhea, nausea and vomiting.  Endocrine: Negative for cold intolerance, heat intolerance, polydipsia, polyphagia and polyuria.  Genitourinary: Negative for dysuria, flank pain, frequency, hematuria, menstrual problem, pelvic pain, urgency, vaginal bleeding and vaginal discharge.  Musculoskeletal: Negative for arthralgias, back pain and myalgias.  Skin: Negative for rash.  Allergic/Immunologic: Negative for environmental allergies and food allergies.  Neurological: Negative for dizziness, focal weakness, weakness, light-headedness, numbness and headaches.  Hematological: Negative for adenopathy. Does not bruise/bleed easily.  Psychiatric/Behavioral: Negative for dysphoric mood. The patient is not nervous/anxious.     Patient Active Problem List   Diagnosis Date Noted  . H/O total hysterectomy 07/17/2020  . Polyp of sigmoid colon   . History of colonic polyps 02/14/2020  . Liver lesion, right lobe 05/06/2019  . Abnormal MRI, thoracic spine 05/06/2019  . Melanoma in situ of pinna of left ear ([place]) 05/06/2019  . S/P laparoscopic cholecystectomy 03/02/2019  . Acute cholecystitis 03/01/2019  . Special screening for malignant neoplasms, colon   . Benign neoplasm of ascending colon   . Acquired hypothyroidism 12/07/2014  . Taking medication  for chronic disease 12/07/2014    No Known Allergies  Past Surgical History:  Procedure Laterality Date  . APPENDECTOMY    .  BREAST CYST ASPIRATION     breast cyst/ not sure which side-neg  . CESAREAN SECTION     x 2  . CHOLECYSTECTOMY N/A 03/01/2019   Procedure: LAPAROSCOPIC CHOLECYSTECTOMY;  Surgeon: Fredirick Maudlin, MD;  Location: [facility];  Service: General;  Laterality: N/A;  . COLONOSCOPY WITH PROPOFOL N/A 01/20/2015   Procedure: COLONOSCOPY WITH PROPOFOL;  Surgeon: Lucilla Lame, MD;  Location: [place];  Service: Endoscopy;  Laterality: N/A;  . COLONOSCOPY WITH PROPOFOL N/A 03/17/2020   Procedure: COLONOSCOPY WITH PROPOFOL;  Surgeon: Lucilla Lame, MD;  Location: [place];  Service: Endoscopy;  Laterality: N/A;  priority 4  . MOHS SURGERY  05/2015   L) ear  . POLYPECTOMY  01/20/2015   Procedure: POLYPECTOMY;  Surgeon: Lucilla Lame, MD;  Location: [place];  Service: Endoscopy;;  . POLYPECTOMY  03/17/2020   Procedure: POLYPECTOMY;  Surgeon: Lucilla Lame, MD;  Location: [place];  Service: Endoscopy;;  . THYROIDECTOMY    . VAGINAL HYSTERECTOMY      Social History   Tobacco Use  . Smoking status: Never Smoker  . Smokeless tobacco: Never Used  Vaping Use  . Vaping Use: Never used  Substance Use Topics  . Alcohol use: Yes    Alcohol/week: 0.0 standard drinks    Comment: 2 glass wine/month  . Drug use: No     Medication list has been reviewed and updated.  Current Meds  Medication Sig  . Calcium Carbonate-Vitamin D (CALCIUM-VITAMIN D) 500-200 MG-UNIT per tablet Take 1 tablet by mouth daily.  . cholecalciferol (VITAMIN D) 1000 units tablet Take 1,000 Units by mouth daily.  . meclizine (ANTIVERT) 25 MG tablet Take 1 tablet (25 mg total) by mouth 3 (three) times daily as needed for dizziness.  . meloxicam (MOBIC) 15 MG tablet Take 1 tablet (15 mg total) by mouth daily.  . Multiple Vitamin (MULTIVITAMIN) capsule Take 1 capsule by mouth daily.  . ondansetron (ZOFRAN) 4 MG tablet Take 1 tablet (4 mg total) by mouth every 8 (eight) hours as needed for nausea or  vomiting.  . vitamin B-12 (CYANOCOBALAMIN) 1000 MCG tablet Take 1,000 mcg by mouth daily.  . [DISCONTINUED] ezetimibe (ZETIA) 10 MG tablet TAKE 1 TABLET(10 MG) BY MOUTH DAILY  . [DISCONTINUED] levothyroxine (SYNTHROID) 200 MCG tablet TAKE 1 TABLET BY MOUTH DAILY BEFORE BREAKFAST.    PHQ 2/9 Scores 03/21/2020 01/27/2020 07/27/2019 04/13/2019  PHQ - 2 Score 0 0 0 0  PHQ- 9 Score 0 0 0 0    GAD 7 : Generalized Anxiety Score 03/21/2020 01/27/2020 07/27/2019 04/13/2019  Nervous, Anxious, on Edge 0 0 0 0  Control/stop worrying 0 0 0 0  Worry too much - different things 0 0 0 0  Trouble relaxing 0 0 0 0  Restless 0 0 0 0  Easily annoyed or irritable 0 0 0 0  Afraid - awful might happen 0 0 0 0  Total GAD 7 Score 0 0 0 0    BP Readings from Last 3 Encounters:  07/26/20 128/80  07/17/20 120/80  04/30/20 (!) 157/75    Physical Exam Vitals and nursing note reviewed.  Constitutional:      Appearance: She is well-developed.  HENT:     Head: Normocephalic.     Right Ear: Tympanic membrane, ear canal and external ear normal.  Left Ear: Tympanic membrane, ear canal and external ear normal.  Eyes:     General: Lids are everted, no foreign bodies appreciated. No scleral icterus.       Left eye: No foreign body or hordeolum.     Conjunctiva/sclera: Conjunctivae normal.     Right eye: Right conjunctiva is not injected.     Left eye: Left conjunctiva is not injected.     Pupils: Pupils are equal, round, and reactive to light.  Neck:     Thyroid: No thyromegaly.     Vascular: No JVD.     Trachea: No tracheal deviation.  Cardiovascular:     Rate and Rhythm: Normal rate and regular rhythm.     Heart sounds: Normal heart sounds, S1 normal and S2 normal. No murmur heard.  No systolic murmur is present.  No diastolic murmur is present. No friction rub. No gallop. No S3 or S4 sounds.   Pulmonary:     Effort: Pulmonary effort is normal. No respiratory distress.     Breath sounds: Normal breath  sounds. No wheezing or rales.  Abdominal:     General: Bowel sounds are normal.     Palpations: Abdomen is soft. There is no mass.     Tenderness: There is no abdominal tenderness. There is no guarding or rebound.  Musculoskeletal:        General: No tenderness. Normal range of motion.     Cervical back: Normal range of motion and neck supple.  Lymphadenopathy:     Cervical: No cervical adenopathy.  Skin:    General: Skin is warm.     Findings: No rash.  Neurological:     Mental Status: She is alert and oriented to person, place, and time.     Cranial Nerves: No cranial nerve deficit.     Deep Tendon Reflexes: Reflexes normal.  Psychiatric:        Mood and Affect: Mood is not anxious or depressed.     Wt Readings from Last 3 Encounters:  07/26/20 235 lb (106.6 kg)  07/17/20 229 lb (103.9 kg)  04/30/20 225 lb (102.1 kg)    BP 128/80   Pulse 72   Ht 5' 7.5" (1.715 m)   Wt 235 lb (106.6 kg)   BMI 36.26 kg/m   Assessment and Plan: 1. Acquired hypothyroidism .  Controlled.  Stable.  Will continue levothyroxine 200 mcg daily.  Will recheck in 6 months. - levothyroxine (SYNTHROID) 200 MCG tablet; TAKE 1 TABLET BY MOUTH DAILY BEFORE BREAKFAST.  Dispense: 30 tablet; Refill: 5  2. Mixed hyperlipidemia Chronic.  Controlled.  Stable.  Continue Zetia 10 mg once a day.  Will recheck in 6 months. - ezetimibe (ZETIA) 10 MG tablet; TAKE 1 TABLET(10 MG) BY MOUTH DAILY  Dispense: 90 tablet; Refill: 1

## 2020-08-21 ENCOUNTER — Other Ambulatory Visit: Payer: Self-pay | Admitting: Neurosurgery

## 2020-08-21 DIAGNOSIS — D497 Neoplasm of unspecified behavior of endocrine glands and other parts of nervous system: Secondary | ICD-10-CM

## 2020-08-31 ENCOUNTER — Ambulatory Visit: Payer: 59

## 2020-09-29 ENCOUNTER — Ambulatory Visit
Admission: RE | Admit: 2020-09-29 | Discharge: 2020-09-29 | Disposition: A | Discharge status: Home / Self Care | Payer: 59 | Source: Ambulatory Visit | Attending: Neurosurgery | Admitting: Neurosurgery

## 2020-09-29 ENCOUNTER — Other Ambulatory Visit: Payer: Self-pay

## 2020-09-29 DIAGNOSIS — D497 Neoplasm of unspecified behavior of endocrine glands and other parts of nervous system: Secondary | ICD-10-CM | POA: Diagnosis not present

## 2020-09-29 MED ORDER — GADOBUTROL 1 MMOL/ML IV SOLN
10.0000 mL | Freq: Once | INTRAVENOUS | Status: AC | PRN
  Administered 2020-09-29: 10 mL via INTRAVENOUS

## 2020-11-03 ENCOUNTER — Ambulatory Visit: Payer: 59

## 2021-01-16 ENCOUNTER — Encounter: Payer: Self-pay | Admitting: Family Medicine

## 2021-01-16 ENCOUNTER — Other Ambulatory Visit: Payer: Self-pay

## 2021-01-16 ENCOUNTER — Ambulatory Visit (INDEPENDENT_AMBULATORY_CARE_PROVIDER_SITE_OTHER): Payer: 59 | Admitting: Family Medicine

## 2021-01-16 VITALS — BP 120/80 | HR 76 | Ht 67.5 in | Wt 230.0 lb

## 2021-01-16 DIAGNOSIS — Z23 Encounter for immunization: Secondary | ICD-10-CM | POA: Diagnosis not present

## 2021-01-16 DIAGNOSIS — E039 Hypothyroidism, unspecified: Secondary | ICD-10-CM | POA: Diagnosis not present

## 2021-01-16 DIAGNOSIS — E782 Mixed hyperlipidemia: Secondary | ICD-10-CM

## 2021-01-16 DIAGNOSIS — R748 Abnormal levels of other serum enzymes: Secondary | ICD-10-CM | POA: Diagnosis not present

## 2021-01-16 MED ORDER — LEVOTHYROXINE SODIUM 200 MCG PO TABS: ORAL_TABLET | ORAL | 5 refills | Status: DC

## 2021-01-16 MED ORDER — EZETIMIBE 10 MG PO TABS: ORAL_TABLET | ORAL | 1 refills | Status: DC

## 2021-01-16 NOTE — Progress Notes (Signed)
Date:  01/16/2021   Name:  Karen Franco   DOB:  1960-09-18   MRN:  [id number]   Chief Complaint: Hypothyroidism, Hyperlipidemia, and Flu Vaccine  Hyperlipidemia This is a chronic problem. The current episode started more than 1 month ago. The problem is controlled. Recent lipid tests were reviewed and are normal. She has no history of chronic renal disease, diabetes, hypothyroidism, liver disease, obesity or nephrotic syndrome. Factors aggravating her hyperlipidemia include thiazides. Pertinent negatives include no chest pain, focal sensory loss, focal weakness, leg pain, myalgias or shortness of breath. Current antihyperlipidemic treatment includes ezetimibe. The current treatment provides moderate improvement of lipids. There are no compliance problems.  Risk factors for coronary artery disease include dyslipidemia.   Lab Results  Component Value Date   CREATININE 0.67 07/17/2020   BUN 12 07/17/2020   NA 140 07/17/2020   K 4.7 07/17/2020   CL 103 07/17/2020   CO2 23 07/17/2020   Lab Results  Component Value Date   CHOL 205 (H) 07/17/2020   HDL 70 07/17/2020   LDLCALC 120 (H) 07/17/2020   TRIG 86 07/17/2020   CHOLHDL 3.2 10/07/2017   Lab Results  Component Value Date   TSH 0.363 (L) 07/17/2020   No results found for: HGBA1C Lab Results  Component Value Date   WBC 12.7 (H) 03/01/2019   HGB 14.0 03/01/2019   HCT 42.8 03/01/2019   MCV 87.2 03/01/2019   PLT 397 03/01/2019   Lab Results  Component Value Date   ALT 16 07/17/2020   AST 16 07/17/2020   ALKPHOS 124 (H) 07/17/2020   BILITOT 0.2 07/17/2020     Review of Systems  Constitutional:  Negative for chills and fever.  HENT:  Negative for drooling, ear discharge, ear pain and sore throat.   Respiratory:  Negative for cough, shortness of breath and wheezing.   Cardiovascular:  Negative for chest pain, palpitations and leg swelling.  Gastrointestinal:  Negative for abdominal pain, blood in stool,  constipation, diarrhea and nausea.  Endocrine: Negative for polydipsia.  Genitourinary:  Negative for dysuria, frequency, hematuria and urgency.  Musculoskeletal:  Negative for back pain, myalgias and neck pain.  Skin:  Negative for rash.  Allergic/Immunologic: Negative for environmental allergies.  Neurological:  Negative for dizziness, focal weakness and headaches.  Hematological:  Does not bruise/bleed easily.  Psychiatric/Behavioral:  Negative for suicidal ideas. The patient is not nervous/anxious.    Patient Active Problem List   Diagnosis Date Noted   H/O total hysterectomy 07/17/2020   Polyp of sigmoid colon    History of colonic polyps 02/14/2020   Liver lesion, right lobe 05/06/2019   Abnormal MRI, thoracic spine 05/06/2019   Melanoma in situ of pinna of left ear ([place]) 05/06/2019   S/P laparoscopic cholecystectomy 03/02/2019   Acute cholecystitis 03/01/2019   Special screening for malignant neoplasms, colon    Benign neoplasm of ascending colon    Acquired hypothyroidism 12/07/2014   Taking medication for chronic disease 12/07/2014    No Known Allergies  Past Surgical History:  Procedure Laterality Date   APPENDECTOMY     BREAST CYST ASPIRATION     breast cyst/ not sure which side-neg   CESAREAN SECTION     x 2   CHOLECYSTECTOMY N/A 03/01/2019   Procedure: LAPAROSCOPIC CHOLECYSTECTOMY;  Surgeon: Fredirick Maudlin, MD;  Location: [facility];  Service: General;  Laterality: N/A;   COLONOSCOPY WITH PROPOFOL N/A 01/20/2015   Procedure: COLONOSCOPY WITH PROPOFOL;  Surgeon: Lucilla Lame,  MD;  Location: [place];  Service: Endoscopy;  Laterality: N/A;   COLONOSCOPY WITH PROPOFOL N/A 03/17/2020   Procedure: COLONOSCOPY WITH PROPOFOL;  Surgeon: Lucilla Lame, MD;  Location: [place];  Service: Endoscopy;  Laterality: N/A;  priority 4   MOHS SURGERY  05/2015   L) ear   POLYPECTOMY  01/20/2015   Procedure: POLYPECTOMY;  Surgeon: Lucilla Lame, MD;  Location: [place];  Service: Endoscopy;;   POLYPECTOMY  03/17/2020   Procedure: POLYPECTOMY;  Surgeon: Lucilla Lame, MD;  Location: [place];  Service: Endoscopy;;   THYROIDECTOMY     VAGINAL HYSTERECTOMY      Social History   Tobacco Use   Smoking status: Never   Smokeless tobacco: Never  Vaping Use   Vaping Use: Never used  Substance Use Topics   Alcohol use: Yes    Alcohol/week: 0.0 standard drinks    Comment: 2 glass wine/month   Drug use: No     Medication list has been reviewed and updated.  Current Meds  Medication Sig   Calcium Carbonate-Vitamin D (CALCIUM-VITAMIN D) 500-200 MG-UNIT per tablet Take 1 tablet by mouth daily.   cholecalciferol (VITAMIN D) 1000 units tablet Take 1,000 Units by mouth daily.   ezetimibe (ZETIA) 10 MG tablet TAKE 1 TABLET(10 MG) BY MOUTH DAILY   levothyroxine (SYNTHROID) 200 MCG tablet TAKE 1 TABLET BY MOUTH DAILY BEFORE BREAKFAST.   meclizine (ANTIVERT) 25 MG tablet Take 1 tablet (25 mg total) by mouth 3 (three) times daily as needed for dizziness.   Multiple Vitamin (MULTIVITAMIN) capsule Take 1 capsule by mouth daily.   vitamin B-12 (CYANOCOBALAMIN) 1000 MCG tablet Take 1,000 mcg by mouth daily.   [DISCONTINUED] meloxicam (MOBIC) 15 MG tablet Take 1 tablet (15 mg total) by mouth daily.    PHQ 2/9 Scores 01/16/2021 03/21/2020 01/27/2020 07/27/2019  PHQ - 2 Score 0 0 0 0  PHQ- 9 Score 0 0 0 0    GAD 7 : Generalized Anxiety Score 01/16/2021 03/21/2020 01/27/2020 07/27/2019  Nervous, Anxious, on Edge 0 0 0 0  Control/stop worrying 0 0 0 0  Worry too much - different things 0 0 0 0  Trouble relaxing 0 0 0 0  Restless 0 0 0 0  Easily annoyed or irritable 0 0 0 0  Afraid - awful might happen 0 0 0 0  Total GAD 7 Score 0 0 0 0    BP Readings from Last 3 Encounters:  01/16/21 120/80  07/26/20 128/80  07/17/20 120/80    Physical Exam Vitals and nursing note reviewed.  Constitutional:      Appearance: She is well-developed.  HENT:      Head: Normocephalic.     Right Ear: Tympanic membrane, ear canal and external ear normal. There is no impacted cerumen.     Left Ear: Tympanic membrane, ear canal and external ear normal. There is no impacted cerumen.     Nose: Nose normal. No congestion or rhinorrhea.  Eyes:     General: Lids are everted, no foreign bodies appreciated. No scleral icterus.       Left eye: No foreign body or hordeolum.     Conjunctiva/sclera: Conjunctivae normal.     Right eye: Right conjunctiva is not injected.     Left eye: Left conjunctiva is not injected.     Pupils: Pupils are equal, round, and reactive to light.  Neck:     Thyroid: No thyromegaly.     Vascular:  No JVD.     Trachea: No tracheal deviation.  Cardiovascular:     Rate and Rhythm: Normal rate and regular rhythm.     Heart sounds: Normal heart sounds. No murmur heard.   No friction rub. No gallop.  Pulmonary:     Effort: Pulmonary effort is normal. No respiratory distress.     Breath sounds: Normal breath sounds. No wheezing, rhonchi or rales.  Abdominal:     General: Bowel sounds are normal.     Palpations: Abdomen is soft. There is no mass.     Tenderness: There is no abdominal tenderness. There is no guarding or rebound.  Musculoskeletal:        General: No tenderness. Normal range of motion.     Cervical back: Normal range of motion and neck supple.  Lymphadenopathy:     Cervical: No cervical adenopathy.  Skin:    General: Skin is warm.     Findings: No rash.  Neurological:     Mental Status: She is alert and oriented to person, place, and time.     Cranial Nerves: No cranial nerve deficit.     Deep Tendon Reflexes: Reflexes normal.  Psychiatric:        Mood and Affect: Mood is not anxious or depressed.    Wt Readings from Last 3 Encounters:  01/16/21 230 lb (104.3 kg)  07/26/20 235 lb (106.6 kg)  07/17/20 229 lb (103.9 kg)    BP 120/80   Pulse 76   Ht 5' 7.5" (1.715 m)   Wt 230 lb (104.3 kg)   BMI 35.49 kg/m    Assessment and Plan: 1. Mixed hyperlipidemia Chronic.  Controlled.  Stable.  Continue Zetia 10 mg once a day.  We will check lipid panel in the future. - ezetimibe (ZETIA) 10 MG tablet; TAKE 1 TABLET(10 MG) BY MOUTH DAILY  Dispense: 90 tablet; Refill: 1  2. Acquired hypothyroidism Chronic.  Controlled.  Stable.  Currently stable with levothyroxine 200 mcg daily.  We will check thyroid panel with TSH. - levothyroxine (SYNTHROID) 200 MCG tablet; TAKE 1 TABLET BY MOUTH DAILY BEFORE BREAKFAST.  Dispense: 30 tablet; Refill: 5 - Thyroid Panel With TSH  3. Elevated alkaline phosphatase level Chronic.  Asymptomatic.  There is an area that is being watched by oncology thought to be an hemangioma but we will obtain an alk phos to determine if this remains elevated. - Alkaline phosphatase  4. Need for immunization against influenza Discussed and administered. - Flu Vaccine QUAD 52moIM (Fluarix, Fluzone & Alfiuria Quad PF)

## 2021-01-17 LAB — THYROID PANEL WITH TSH
Free Thyroxine Index: 3.4 (ref 1.2–4.9)
T3 Uptake Ratio: 31 % (ref 24–39)
T4, Total: 11.1 ug/dL (ref 4.5–12.0)
TSH: 0.091 u[IU]/mL — ABNORMAL LOW (ref 0.450–4.500)

## 2021-01-17 LAB — ALKALINE PHOSPHATASE: Alkaline Phosphatase: 112 IU/L (ref 44–121)

## 2021-01-30 ENCOUNTER — Ambulatory Visit: Payer: 59 | Admitting: Family Medicine

## 2021-02-07 ENCOUNTER — Ambulatory Visit: Payer: 59 | Admitting: Family Medicine

## 2021-02-12 ENCOUNTER — Ambulatory Visit
Admission: RE | Admit: 2021-02-12 | Discharge: 2021-02-12 | Disposition: A | Discharge status: Home / Self Care | Payer: 59 | Source: Ambulatory Visit | Attending: Hematology and Oncology | Admitting: Hematology and Oncology

## 2021-02-12 ENCOUNTER — Other Ambulatory Visit: Payer: Self-pay

## 2021-02-12 DIAGNOSIS — K769 Liver disease, unspecified: Secondary | ICD-10-CM

## 2021-02-12 MED ORDER — GADOBUTROL 1 MMOL/ML IV SOLN
10.0000 mL | Freq: Once | INTRAVENOUS | Status: AC | PRN
  Administered 2021-02-12: 10 mL via INTRAVENOUS

## 2021-02-14 ENCOUNTER — Ambulatory Visit: Payer: 59 | Admitting: Oncology

## 2021-02-14 ENCOUNTER — Encounter: Payer: Self-pay | Admitting: Oncology

## 2021-02-14 ENCOUNTER — Other Ambulatory Visit: Payer: Self-pay | Admitting: *Deleted

## 2021-02-14 ENCOUNTER — Inpatient Hospital Stay: Payer: 59 | Attending: Hematology and Oncology | Admitting: Oncology

## 2021-02-14 VITALS — BP 140/68 | HR 68 | Temp 96.9°F | Resp 17 | Wt 232.0 lb

## 2021-02-14 DIAGNOSIS — R937 Abnormal findings on diagnostic imaging of other parts of musculoskeletal system: Secondary | ICD-10-CM

## 2021-02-14 DIAGNOSIS — Z803 Family history of malignant neoplasm of breast: Secondary | ICD-10-CM | POA: Insufficient documentation

## 2021-02-14 DIAGNOSIS — K769 Liver disease, unspecified: Secondary | ICD-10-CM

## 2021-02-14 DIAGNOSIS — D134 Benign neoplasm of liver: Secondary | ICD-10-CM | POA: Diagnosis not present

## 2021-02-14 NOTE — Progress Notes (Signed)
Having no symptoms. Having surgery on the spine for a tumor next week. MD thinks it is benign. Appetite is normal. Good energy. Sleeping well. B/B WNLs. Here for scan results.

## 2021-02-17 NOTE — Progress Notes (Signed)
Hematology/Oncology Consult note [facility]  [phone] [phone]  Patient Care Team: Juline Patch, MD as PCP - General (Family Medicine)   Name of the patient: Karen Franco  [id number]  1960/06/11   Date of visit: 02/17/21  Diagnosis- haptic lesion possibly adenoma  Chief complaint/ Reason for visit- routine f/u of hepatic adenoma  Heme/Onc history: Patient is a 60 year old female transferring her care from Dr. Mike Gip.  She was seen her for a subcapsular mass in the right lobe of the liver which has been followed in the past.She has been getting yearly MRI of the abdomen.  This small lesion in the right lower lobe of the liver was consistent with a hepatic adenoma.  She does not have any baseline cirrhosis.  Patient has a prior history of melanoma which was resected.  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 Purvis Kilts was being followed as an outpatient her recent MRI in May 2022 showed subtle enlargement of this mass which was about 2 mm larger and associated with some mild mass-effect on the spinal cord.  Plan was for Dr. Cari Caraway to proceed with definitive resection of the lesion which she will be getting soon  Interval history-she reports feeling well overall.  Denies any specific complaints at this time.  Appetite and weight have remained stable.  ECOG PS- 0 Pain scale- 0   Review of systems- Review of Systems  Constitutional:  Negative for chills, fever, malaise/fatigue and weight loss.  HENT:  Negative for congestion, ear discharge and nosebleeds.   Eyes:  Negative for blurred vision.  Respiratory:  Negative for cough, hemoptysis, sputum production, shortness of breath and wheezing.   Cardiovascular:  Negative for chest pain, palpitations, orthopnea and claudication.  Gastrointestinal:  Negative for abdominal pain, blood in stool, constipation, diarrhea, heartburn,  melena, nausea and vomiting.  Genitourinary:  Negative for dysuria, flank pain, frequency, hematuria and urgency.  Musculoskeletal:  Negative for back pain, joint pain and myalgias.  Skin:  Negative for rash.  Neurological:  Negative for dizziness, tingling, focal weakness, seizures, weakness and headaches.  Endo/Heme/Allergies:  Does not bruise/bleed easily.  Psychiatric/Behavioral:  Negative for depression and suicidal ideas. The patient does not have insomnia.      No Known Allergies   Past Medical History:  Diagnosis Date   Cancer ([place])    melanoma- on left ear   Dysrhythmia    palpatations if thyroid meds are "off"   Hypothyroidism    Motion sickness    cars - back seat   Seizures ([place])    22 yrs ago - controlled on Depakote   Thyroid disease      Past Surgical History:  Procedure Laterality Date   APPENDECTOMY     BREAST CYST ASPIRATION     breast cyst/ not sure which side-neg   CESAREAN SECTION     x 2   CHOLECYSTECTOMY N/A 03/01/2019   Procedure: LAPAROSCOPIC CHOLECYSTECTOMY;  Surgeon: Fredirick Maudlin, MD;  Location: [facility];  Service: General;  Laterality: N/A;   COLONOSCOPY WITH PROPOFOL N/A 01/20/2015   Procedure: COLONOSCOPY WITH PROPOFOL;  Surgeon: Lucilla Lame, MD;  Location: [place];  Service: Endoscopy;  Laterality: N/A;   COLONOSCOPY WITH PROPOFOL N/A 03/17/2020   Procedure: COLONOSCOPY WITH PROPOFOL;  Surgeon: Lucilla Lame, MD;  Location: [place];  Service: Endoscopy;  Laterality: N/A;  priority 4   MOHS SURGERY  05/2015  L) ear   POLYPECTOMY  01/20/2015   Procedure: POLYPECTOMY;  Surgeon: Lucilla Lame, MD;  Location: [place];  Service: Endoscopy;;   POLYPECTOMY  03/17/2020   Procedure: POLYPECTOMY;  Surgeon: Lucilla Lame, MD;  Location: [place];  Service: Endoscopy;;   THYROIDECTOMY     VAGINAL HYSTERECTOMY      Social History   Socioeconomic History   Marital status: Married    Spouse name: Not on  file   Number of children: Not on file   Years of education: Not on file   Highest education level: Not on file  Occupational History   Not on file  Tobacco Use   Smoking status: Never   Smokeless tobacco: Never  Vaping Use   Vaping Use: Never used  Substance and Sexual Activity   Alcohol use: Yes    Alcohol/week: 0.0 standard drinks    Comment: 2 glass wine/month   Drug use: No   Sexual activity: Yes  Other Topics Concern   Not on file  Social History Narrative   Not on file   Social Determinants of Health   Financial Resource Strain: Not on file  Food Insecurity: Not on file  Transportation Needs: Not on file  Physical Activity: Not on file  Stress: Not on file  Social Connections: Not on file  Intimate Partner Violence: Not on file    Family History  Problem Relation Age of Onset   Cancer Mother    Heart disease Father    Diabetes Maternal Grandfather    Breast cancer Paternal Aunt      Current Outpatient Medications:    Calcium Carbonate-Vitamin D (CALCIUM-VITAMIN D) 500-200 MG-UNIT per tablet, Take 1 tablet by mouth daily., Disp: , Rfl:    cholecalciferol (VITAMIN D) 1000 units tablet, Take 1,000 Units by mouth daily., Disp: , Rfl:    ezetimibe (ZETIA) 10 MG tablet, TAKE 1 TABLET(10 MG) BY MOUTH DAILY, Disp: 90 tablet, Rfl: 1   levothyroxine (SYNTHROID) 200 MCG tablet, TAKE 1 TABLET BY MOUTH DAILY BEFORE BREAKFAST., Disp: 30 tablet, Rfl: 5   Multiple Vitamin (MULTIVITAMIN) capsule, Take 1 capsule by mouth daily., Disp: , Rfl:    vitamin B-12 (CYANOCOBALAMIN) 1000 MCG tablet, Take 1,000 mcg by mouth daily., Disp: , Rfl:   Physical exam:  Vitals:   02/14/21 1036  BP: 140/68  Pulse: 68  Resp: 17  Temp: (!) 96.9 F (36.1 C)  TempSrc: Tympanic  SpO2: 100%  Weight: 232 lb (105.2 kg)   Physical Exam Constitutional:      General: She is not in acute distress. Cardiovascular:     Rate and Rhythm: Normal rate and regular rhythm.     Heart sounds: Normal  heart sounds.  Pulmonary:     Effort: Pulmonary effort is normal.     Breath sounds: Normal breath sounds.  Abdominal:     General: Bowel sounds are normal.     Palpations: Abdomen is soft.  Skin:    General: Skin is warm and dry.  Neurological:     Mental Status: She is alert and oriented to person, place, and time.     CMP Latest Ref Rng & Units 01/16/2021  Glucose 65 - 99 mg/dL -  BUN 6 - 24 mg/dL -  Creatinine 0.57 - 1.00 mg/dL -  Sodium 134 - 144 mmol/L -  Potassium 3.5 - 5.2 mmol/L -  Chloride 96 - 106 mmol/L -  CO2 20 - 29 mmol/L -  Calcium 8.7 -  10.2 mg/dL -  Total Protein 6.0 - 8.5 g/dL -  Total Bilirubin 0.0 - 1.2 mg/dL -  Alkaline Phos 44 - 121 IU/L 112  AST 0 - 40 IU/L -  ALT 0 - 32 IU/L -   CBC Latest Ref Rng & Units 03/01/2019  WBC 4.0 - 10.5 K/uL 12.7(H)  Hemoglobin 12.0 - 15.0 g/dL 14.0  Hematocrit 36.0 - 46.0 % 42.8  Platelets 150 - 400 K/uL 397    No images are attached to the encounter.  MR Abdomen W Wo Contrast  Result Date: 02/12/2021 EXAM: MRI ABDOMEN WITHOUT AND WITH CONTRAST TECHNIQUE: Multiplanar multisequence MR imaging of the abdomen was performed both before and after the administration of intravenous contrast. CONTRAST:  3mL GADAVIST GADOBUTROL 1 MMOL/ML IV SOLN COMPARISON:  None. FINDINGS: Lower chest: No acute findings. Hepatobiliary: Unchanged benign hemangiomas the posterior right lobe of the liver and posterior left lobe of the liver. Very subtle lesion anterior to the large hemangioma in the posterior right lobe of the liver demonstrates signal dropout on out of phase imaging and T1 hyperintensity, not visualized on T2 imaging or contrast enhanced series on today's exam. Measures up to 5 mm on series 12, image 33. Pancreas: No mass, inflammatory changes, or other parenchymal abnormality identified. Spleen:  Within normal limits in size and appearance. Adrenals/Urinary Tract: No hydronephrosis. T2 hyperintense lesions of the left kidney with no  evidence of contrast enhancement, compatible with a simple cyst. Stomach/Bowel: Visualized portions within the abdomen are unremarkable. Vascular/Lymphatic: No pathologically enlarged lymph nodes identified. No abdominal aortic aneurysm demonstrated. Other:  None. Musculoskeletal: Intradural enhancing mass of the level of T11, similar to prior exam. IMPRESSION: Subtle lesion of the right lower lobe of the liver is less conspicuous on today's exam, measuring up to 5 mm with no definite contrast enhancement, likely a small hepatic adenoma. Benign hemangiomas of the left and right lobe of the liver. Intradural extra medullary mass at the level of T11, similar to prior exam. See MRI of the thoracic spine dated Sep 29, 2020 for more complete characterization. Electronically Signed   By: Yetta Glassman M.D.   On: 02/12/2021 16:29     Assessment and plan- Patient is a 60 y.o. female with history of hepatic adenoma here for routine follow-up  I have reviewed MRI findings independently and discussed results with the patient which shows stable size of right lower lobe hepatic mass roughly 5 mm in size and has an appearance of a hepatic adenoma.  Patient is not on any oral contraceptives which can make this worse.  She will need continued follow-up for this and I will plan to see her back in 1 year with an MRI abdomen prior.  Patient will be undergoing definitive resection of her intramedullary mass at the level of T11 by Dr. Cari Caraway.  She does have a prior history of melanoma and it is unclear at this mass is unrelated versus possible melanoma.  If this does turn out to be melanoma then Dr. Nelly Laurence office will be in touch with [place].   Visit Diagnosis 1. Hepatic adenoma      Dr. Randa Evens, MD, MPH [facility] [phone] 02/17/2021 3:14 PM

## 2021-02-19 ENCOUNTER — Encounter: Payer: Self-pay | Admitting: General Surgery

## 2021-03-05 ENCOUNTER — Ambulatory Visit
Admission: RE | Admit: 2021-03-05 | Discharge: 2021-03-05 | Disposition: A | Discharge status: Home / Self Care | Payer: 59 | Source: Ambulatory Visit | Attending: Radiation Oncology | Admitting: Radiation Oncology

## 2021-03-05 ENCOUNTER — Encounter: Payer: Self-pay | Admitting: Radiation Oncology

## 2021-03-05 ENCOUNTER — Other Ambulatory Visit: Payer: Self-pay

## 2021-03-05 VITALS — BP 148/73 | HR 69 | Temp 97.8°F | Resp 20 | Wt 227.8 lb

## 2021-03-05 DIAGNOSIS — K769 Liver disease, unspecified: Secondary | ICD-10-CM | POA: Diagnosis not present

## 2021-03-05 DIAGNOSIS — C719 Malignant neoplasm of brain, unspecified: Secondary | ICD-10-CM | POA: Insufficient documentation

## 2021-03-05 DIAGNOSIS — E039 Hypothyroidism, unspecified: Secondary | ICD-10-CM | POA: Insufficient documentation

## 2021-03-05 DIAGNOSIS — D329 Benign neoplasm of meninges, unspecified: Secondary | ICD-10-CM

## 2021-03-05 NOTE — Consult Note (Signed)
NEW PATIENT EVALUATION  Name: Karen Franco  MRN: [id number]  Date:   03/05/2021     DOB: December 31, 1960   This 60 y.o. female patient presents to the clinic for initial evaluation of T12 atypical meningioma WHO grade 2 status post resection.  REFERRING PHYSICIAN: Juline Patch, MD  CHIEF COMPLAINT:  Chief Complaint  Patient presents with   Consult    DIAGNOSIS: The encounter diagnosis was Meningioma [facility]).   PREVIOUS INVESTIGATIONS:  MRI scans reviewed Clinical notes reviewed Pathology report reviewed  HPI: Patient is a 60 year old female who been followed for a slowly progressive mass at T12 consistent with meningioma.  She also has a history of subcapsular mass in the right lobe of the liver which has been followed consistent with a hepatic adenoma.  She was seen by Dr. Cari Caraway and taken to surgery had a resection for again WHO grade 2 atypical meningioma.  She has done well postoperatively.  Her surgery was a laminectomy with lumbar drain placement there were no intraoperative complications she specifically denies any decreased strength in her lower extremities does have some slight numbness in her right thigh.  She is otherwise without complaint.  She is referred from Dr. Cari Caraway for consideration of adjuvant radiation therapy.  PLANNED TREATMENT REGIMEN: Adjuvant radiation therapy to T12  PAST MEDICAL HISTORY:  has a past medical history of Cancer ([place]), Dysrhythmia, Hypothyroidism, Motion sickness, Seizures ([place]), and Thyroid disease.    PAST SURGICAL HISTORY:  Past Surgical History:  Procedure Laterality Date   APPENDECTOMY     BREAST CYST ASPIRATION     breast cyst/ not sure which side-neg   CESAREAN SECTION     x 2   CHOLECYSTECTOMY N/A 03/01/2019   Procedure: LAPAROSCOPIC CHOLECYSTECTOMY;  Surgeon: Fredirick Maudlin, MD;  Location: [facility];  Service: General;  Laterality: N/A;   COLONOSCOPY WITH PROPOFOL N/A 01/20/2015   Procedure: COLONOSCOPY WITH  PROPOFOL;  Surgeon: Lucilla Lame, MD;  Location: [place];  Service: Endoscopy;  Laterality: N/A;   COLONOSCOPY WITH PROPOFOL N/A 03/17/2020   Procedure: COLONOSCOPY WITH PROPOFOL;  Surgeon: Lucilla Lame, MD;  Location: [place];  Service: Endoscopy;  Laterality: N/A;  priority 4   MOHS SURGERY  05/2015   L) ear   POLYPECTOMY  01/20/2015   Procedure: POLYPECTOMY;  Surgeon: Lucilla Lame, MD;  Location: [place];  Service: Endoscopy;;   POLYPECTOMY  03/17/2020   Procedure: POLYPECTOMY;  Surgeon: Lucilla Lame, MD;  Location: [place];  Service: Endoscopy;;   THYROIDECTOMY     VAGINAL HYSTERECTOMY      FAMILY HISTORY: family history includes Breast cancer in her paternal aunt; Cancer in her mother; Diabetes in her maternal grandfather; Heart disease in her father.  SOCIAL HISTORY:  reports that she has never smoked. She has never used smokeless tobacco. She reports current alcohol use. She reports that she does not use drugs.  ALLERGIES: Patient has no known allergies.  MEDICATIONS:  Current Outpatient Medications  Medication Sig Dispense Refill   ezetimibe (ZETIA) 10 MG tablet TAKE 1 TABLET(10 MG) BY MOUTH DAILY 90 tablet 1   levothyroxine (SYNTHROID) 200 MCG tablet TAKE 1 TABLET BY MOUTH DAILY BEFORE BREAKFAST. 30 tablet 5   Calcium Carbonate-Vitamin D (CALCIUM-VITAMIN D) 500-200 MG-UNIT per tablet Take 1 tablet by mouth daily.     cholecalciferol (VITAMIN D) 1000 units tablet Take 1,000 Units by mouth daily.     Multiple Vitamin (MULTIVITAMIN) capsule Take 1 capsule by mouth daily.  vitamin B-12 (CYANOCOBALAMIN) 1000 MCG tablet Take 1,000 mcg by mouth daily.     No current facility-administered medications for this encounter.    ECOG PERFORMANCE STATUS:  0 - Asymptomatic  REVIEW OF SYSTEMS: Patient denies any weight loss, fatigue, weakness, fever, chills or night sweats. Patient denies any loss of vision, blurred vision. Patient denies any  ringing  of the ears or hearing loss. No irregular heartbeat. Patient denies heart murmur or history of fainting. Patient denies any chest pain or pain radiating to her upper extremities. Patient denies any shortness of breath, difficulty breathing at night, cough or hemoptysis. Patient denies any swelling in the lower legs. Patient denies any nausea vomiting, vomiting of blood, or coffee ground material in the vomitus. Patient denies any stomach pain. Patient states has had normal bowel movements no significant constipation or diarrhea. Patient denies any dysuria, hematuria or significant nocturia. Patient denies any problems walking, swelling in the joints or loss of balance. Patient denies any skin changes, loss of hair or loss of weight. Patient denies any excessive worrying or anxiety or significant depression. Patient denies any problems with insomnia. Patient denies excessive thirst, polyuria, polydipsia. Patient denies any swollen glands, patient denies easy bruising or easy bleeding. Patient denies any recent infections, allergies or URI. Patient "s visual fields have not changed significantly in recent time.   PHYSICAL EXAM: BP (!) 148/73   Pulse 69   Temp 97.8 F (36.6 C)   Resp 20   Wt 227 lb 12.8 oz (103.3 kg)   SpO2 100%   BMI 35.15 kg/m  Well-developed well-nourished patient in NAD. HEENT reveals PERLA, EOMI, discs not visualized.  Oral cavity is clear. No oral mucosal lesions are identified. Neck is clear without evidence of cervical or supraclavicular adenopathy. Lungs are clear to A&P. Cardiac examination is essentially unremarkable with regular rate and rhythm without murmur rub or thrill. Abdomen is benign with no organomegaly or masses noted. Motor sensory and DTR levels are equal and symmetric in the upper and lower extremities. Cranial nerves II through XII are grossly intact. Proprioception is intact. No peripheral adenopathy or edema is identified. No motor or sensory levels are  noted. Crude visual fields are within normal range.  LABORATORY DATA: Pathology report reviewed    RADIOLOGY RESULTS: MRI scans reviewed compatible with above-stated findings   IMPRESSION: Atypical meningioma WHO grade 2 status post resection at T12 vertebral body in 60 year old female  PLAN: Based on the location of this tumor and chance for incomplete surgical resection for atypical meningioma there has been recommendation for adjuvant radiation therapy.  I have planned on treating 50 Gray over 5 weeks.  We used 3-dimensional hybrid treatment technique to avoid bowel toxicity.  Risks and benefits of treatment including skin reaction fatigue possible GI upset all were discussed in detail with the patient.  I would use MRI fusion study for treatment planning purposes.  Patient comprehends my recommendations well.  Simulation appointment was given.  I would like to take this opportunity to thank you for allowing me to participate in the care of your patient.Noreene Filbert, MD

## 2021-03-13 ENCOUNTER — Ambulatory Visit
Admission: RE | Admit: 2021-03-13 | Discharge: 2021-03-13 | Disposition: A | Discharge status: Home / Self Care | Payer: 59 | Source: Ambulatory Visit | Attending: Radiation Oncology | Admitting: Radiation Oncology

## 2021-03-13 DIAGNOSIS — Z510 Encounter for antineoplastic radiation therapy: Secondary | ICD-10-CM | POA: Insufficient documentation

## 2021-03-13 DIAGNOSIS — D429 Neoplasm of uncertain behavior of meninges, unspecified: Secondary | ICD-10-CM | POA: Diagnosis present

## 2021-03-14 DIAGNOSIS — Z510 Encounter for antineoplastic radiation therapy: Secondary | ICD-10-CM | POA: Diagnosis not present

## 2021-03-19 ENCOUNTER — Ambulatory Visit: Admission: RE | Admit: 2021-03-19 | Payer: 59 | Source: Ambulatory Visit

## 2021-03-19 DIAGNOSIS — Z510 Encounter for antineoplastic radiation therapy: Secondary | ICD-10-CM | POA: Diagnosis not present

## 2021-03-20 ENCOUNTER — Ambulatory Visit
Admission: RE | Admit: 2021-03-20 | Discharge: 2021-03-20 | Disposition: A | Discharge status: Home / Self Care | Payer: 59 | Source: Ambulatory Visit | Attending: Radiation Oncology | Admitting: Radiation Oncology

## 2021-03-20 DIAGNOSIS — Z510 Encounter for antineoplastic radiation therapy: Secondary | ICD-10-CM | POA: Diagnosis not present

## 2021-03-21 ENCOUNTER — Ambulatory Visit
Admission: RE | Admit: 2021-03-21 | Discharge: 2021-03-21 | Disposition: A | Discharge status: Home / Self Care | Payer: 59 | Source: Ambulatory Visit | Attending: Radiation Oncology | Admitting: Radiation Oncology

## 2021-03-21 DIAGNOSIS — Z510 Encounter for antineoplastic radiation therapy: Secondary | ICD-10-CM | POA: Diagnosis not present

## 2021-03-22 ENCOUNTER — Ambulatory Visit
Admission: RE | Admit: 2021-03-22 | Discharge: 2021-03-22 | Disposition: A | Discharge status: Home / Self Care | Payer: 59 | Source: Ambulatory Visit | Attending: Radiation Oncology | Admitting: Radiation Oncology

## 2021-03-22 DIAGNOSIS — Z510 Encounter for antineoplastic radiation therapy: Secondary | ICD-10-CM | POA: Diagnosis not present

## 2021-03-23 ENCOUNTER — Ambulatory Visit
Admission: RE | Admit: 2021-03-23 | Discharge: 2021-03-23 | Disposition: A | Discharge status: Home / Self Care | Payer: 59 | Source: Ambulatory Visit | Attending: Radiation Oncology | Admitting: Radiation Oncology

## 2021-03-23 DIAGNOSIS — Z510 Encounter for antineoplastic radiation therapy: Secondary | ICD-10-CM | POA: Diagnosis not present

## 2021-03-26 ENCOUNTER — Ambulatory Visit
Admission: RE | Admit: 2021-03-26 | Discharge: 2021-03-26 | Disposition: A | Discharge status: Home / Self Care | Payer: 59 | Source: Ambulatory Visit | Attending: Radiation Oncology | Admitting: Radiation Oncology

## 2021-03-26 DIAGNOSIS — Z510 Encounter for antineoplastic radiation therapy: Secondary | ICD-10-CM | POA: Diagnosis not present

## 2021-03-27 ENCOUNTER — Ambulatory Visit
Admission: RE | Admit: 2021-03-27 | Discharge: 2021-03-27 | Disposition: A | Discharge status: Home / Self Care | Payer: 59 | Source: Ambulatory Visit | Attending: Radiation Oncology | Admitting: Radiation Oncology

## 2021-03-27 DIAGNOSIS — Z510 Encounter for antineoplastic radiation therapy: Secondary | ICD-10-CM | POA: Diagnosis not present

## 2021-03-28 ENCOUNTER — Ambulatory Visit
Admission: RE | Admit: 2021-03-28 | Discharge: 2021-03-28 | Disposition: A | Discharge status: Home / Self Care | Payer: 59 | Source: Ambulatory Visit | Attending: Radiation Oncology | Admitting: Radiation Oncology

## 2021-03-28 DIAGNOSIS — Z510 Encounter for antineoplastic radiation therapy: Secondary | ICD-10-CM | POA: Diagnosis not present

## 2021-03-29 ENCOUNTER — Ambulatory Visit
Admission: RE | Admit: 2021-03-29 | Discharge: 2021-03-29 | Disposition: A | Discharge status: Home / Self Care | Payer: 59 | Source: Ambulatory Visit | Attending: Radiation Oncology | Admitting: Radiation Oncology

## 2021-03-29 DIAGNOSIS — Z510 Encounter for antineoplastic radiation therapy: Secondary | ICD-10-CM | POA: Diagnosis not present

## 2021-03-30 ENCOUNTER — Ambulatory Visit
Admission: RE | Admit: 2021-03-30 | Discharge: 2021-03-30 | Disposition: A | Discharge status: Home / Self Care | Payer: 59 | Source: Ambulatory Visit | Attending: Radiation Oncology | Admitting: Radiation Oncology

## 2021-03-30 DIAGNOSIS — Z510 Encounter for antineoplastic radiation therapy: Secondary | ICD-10-CM | POA: Diagnosis not present

## 2021-04-02 ENCOUNTER — Ambulatory Visit
Admission: RE | Admit: 2021-04-02 | Discharge: 2021-04-02 | Disposition: A | Discharge status: Home / Self Care | Payer: 59 | Source: Ambulatory Visit | Attending: Radiation Oncology | Admitting: Radiation Oncology

## 2021-04-02 DIAGNOSIS — Z510 Encounter for antineoplastic radiation therapy: Secondary | ICD-10-CM | POA: Diagnosis not present

## 2021-04-03 ENCOUNTER — Ambulatory Visit
Admission: RE | Admit: 2021-04-03 | Discharge: 2021-04-03 | Disposition: A | Discharge status: Home / Self Care | Payer: 59 | Source: Ambulatory Visit | Attending: Radiation Oncology | Admitting: Radiation Oncology

## 2021-04-03 DIAGNOSIS — Z510 Encounter for antineoplastic radiation therapy: Secondary | ICD-10-CM | POA: Diagnosis not present

## 2021-04-04 ENCOUNTER — Ambulatory Visit
Admission: RE | Admit: 2021-04-04 | Discharge: 2021-04-04 | Disposition: A | Discharge status: Home / Self Care | Payer: 59 | Source: Ambulatory Visit | Attending: Radiation Oncology | Admitting: Radiation Oncology

## 2021-04-04 DIAGNOSIS — Z510 Encounter for antineoplastic radiation therapy: Secondary | ICD-10-CM | POA: Diagnosis not present

## 2021-04-09 ENCOUNTER — Ambulatory Visit
Admission: RE | Admit: 2021-04-09 | Discharge: 2021-04-09 | Disposition: A | Discharge status: Home / Self Care | Payer: 59 | Source: Ambulatory Visit | Attending: Radiation Oncology | Admitting: Radiation Oncology

## 2021-04-09 DIAGNOSIS — Z510 Encounter for antineoplastic radiation therapy: Secondary | ICD-10-CM | POA: Diagnosis not present

## 2021-04-10 ENCOUNTER — Ambulatory Visit
Admission: RE | Admit: 2021-04-10 | Discharge: 2021-04-10 | Disposition: A | Discharge status: Home / Self Care | Payer: 59 | Source: Ambulatory Visit | Attending: Radiation Oncology | Admitting: Radiation Oncology

## 2021-04-10 DIAGNOSIS — Z510 Encounter for antineoplastic radiation therapy: Secondary | ICD-10-CM | POA: Diagnosis not present

## 2021-04-11 ENCOUNTER — Ambulatory Visit
Admission: RE | Admit: 2021-04-11 | Discharge: 2021-04-11 | Disposition: A | Discharge status: Home / Self Care | Payer: 59 | Source: Ambulatory Visit | Attending: Radiation Oncology | Admitting: Radiation Oncology

## 2021-04-11 DIAGNOSIS — Z510 Encounter for antineoplastic radiation therapy: Secondary | ICD-10-CM | POA: Diagnosis not present

## 2021-04-12 ENCOUNTER — Ambulatory Visit
Admission: RE | Admit: 2021-04-12 | Discharge: 2021-04-12 | Disposition: A | Discharge status: Home / Self Care | Payer: 59 | Source: Ambulatory Visit | Attending: Radiation Oncology | Admitting: Radiation Oncology

## 2021-04-12 DIAGNOSIS — D429 Neoplasm of uncertain behavior of meninges, unspecified: Secondary | ICD-10-CM | POA: Insufficient documentation

## 2021-04-12 DIAGNOSIS — Z510 Encounter for antineoplastic radiation therapy: Secondary | ICD-10-CM | POA: Diagnosis present

## 2021-04-13 ENCOUNTER — Ambulatory Visit
Admission: RE | Admit: 2021-04-13 | Discharge: 2021-04-13 | Disposition: A | Discharge status: Home / Self Care | Payer: 59 | Source: Ambulatory Visit | Attending: Radiation Oncology | Admitting: Radiation Oncology

## 2021-04-13 DIAGNOSIS — Z510 Encounter for antineoplastic radiation therapy: Secondary | ICD-10-CM | POA: Diagnosis not present

## 2021-04-16 ENCOUNTER — Ambulatory Visit
Admission: RE | Admit: 2021-04-16 | Discharge: 2021-04-16 | Disposition: A | Discharge status: Home / Self Care | Payer: 59 | Source: Ambulatory Visit | Attending: Radiation Oncology | Admitting: Radiation Oncology

## 2021-04-16 DIAGNOSIS — Z510 Encounter for antineoplastic radiation therapy: Secondary | ICD-10-CM | POA: Diagnosis not present

## 2021-04-17 ENCOUNTER — Ambulatory Visit
Admission: RE | Admit: 2021-04-17 | Discharge: 2021-04-17 | Disposition: A | Discharge status: Home / Self Care | Payer: 59 | Source: Ambulatory Visit | Attending: Radiation Oncology | Admitting: Radiation Oncology

## 2021-04-17 DIAGNOSIS — Z510 Encounter for antineoplastic radiation therapy: Secondary | ICD-10-CM | POA: Diagnosis not present

## 2021-04-18 ENCOUNTER — Ambulatory Visit
Admission: RE | Admit: 2021-04-18 | Discharge: 2021-04-18 | Disposition: A | Discharge status: Home / Self Care | Payer: 59 | Source: Ambulatory Visit | Attending: Radiation Oncology | Admitting: Radiation Oncology

## 2021-04-18 DIAGNOSIS — Z510 Encounter for antineoplastic radiation therapy: Secondary | ICD-10-CM | POA: Diagnosis not present

## 2021-04-19 ENCOUNTER — Ambulatory Visit
Admission: RE | Admit: 2021-04-19 | Discharge: 2021-04-19 | Disposition: A | Discharge status: Home / Self Care | Payer: 59 | Source: Ambulatory Visit | Attending: Radiation Oncology | Admitting: Radiation Oncology

## 2021-04-19 DIAGNOSIS — Z510 Encounter for antineoplastic radiation therapy: Secondary | ICD-10-CM | POA: Diagnosis not present

## 2021-04-20 ENCOUNTER — Ambulatory Visit
Admission: RE | Admit: 2021-04-20 | Discharge: 2021-04-20 | Disposition: A | Discharge status: Home / Self Care | Payer: 59 | Source: Ambulatory Visit | Attending: Radiation Oncology | Admitting: Radiation Oncology

## 2021-04-20 DIAGNOSIS — Z510 Encounter for antineoplastic radiation therapy: Secondary | ICD-10-CM | POA: Diagnosis not present

## 2021-04-23 ENCOUNTER — Ambulatory Visit
Admission: RE | Admit: 2021-04-23 | Discharge: 2021-04-23 | Disposition: A | Discharge status: Home / Self Care | Payer: 59 | Source: Ambulatory Visit | Attending: Radiation Oncology | Admitting: Radiation Oncology

## 2021-04-23 DIAGNOSIS — Z510 Encounter for antineoplastic radiation therapy: Secondary | ICD-10-CM | POA: Diagnosis not present

## 2021-04-24 ENCOUNTER — Ambulatory Visit
Admission: RE | Admit: 2021-04-24 | Discharge: 2021-04-24 | Disposition: A | Discharge status: Home / Self Care | Payer: 59 | Source: Ambulatory Visit | Attending: Radiation Oncology | Admitting: Radiation Oncology

## 2021-04-24 DIAGNOSIS — Z510 Encounter for antineoplastic radiation therapy: Secondary | ICD-10-CM | POA: Diagnosis not present

## 2021-04-25 ENCOUNTER — Ambulatory Visit
Admission: RE | Admit: 2021-04-25 | Discharge: 2021-04-25 | Disposition: A | Discharge status: Home / Self Care | Payer: 59 | Source: Ambulatory Visit | Attending: Radiation Oncology | Admitting: Radiation Oncology

## 2021-04-25 DIAGNOSIS — Z510 Encounter for antineoplastic radiation therapy: Secondary | ICD-10-CM | POA: Diagnosis not present

## 2021-06-06 ENCOUNTER — Encounter: Payer: Self-pay | Admitting: Radiation Oncology

## 2021-06-06 ENCOUNTER — Ambulatory Visit
Admission: RE | Admit: 2021-06-06 | Discharge: 2021-06-06 | Disposition: A | Discharge status: Home / Self Care | Payer: 59 | Source: Ambulatory Visit | Attending: Radiation Oncology | Admitting: Radiation Oncology

## 2021-06-06 ENCOUNTER — Other Ambulatory Visit: Payer: Self-pay

## 2021-06-06 VITALS — BP 125/72 | HR 73 | Temp 97.0°F | Resp 16 | Wt 218.4 lb

## 2021-06-06 DIAGNOSIS — D329 Benign neoplasm of meninges, unspecified: Secondary | ICD-10-CM

## 2021-06-06 NOTE — Progress Notes (Signed)
Radiation Oncology Follow up Note  Name: Karen Franco   Date:   06/06/2021 MRN:  [id number] DOB: 03-Apr-1961    This 61 y.o. female presents to the clinic today for 1 month follow-up status post external beam radiation therapy to T12 for an atypical meningioma WHO grade 2 status post resection.  REFERRING PROVIDER: Juline Patch, MD  HPI: Patient is a 61 year old female now at 1 month having completed adjuvant radiation therapy to T12 for an atypical meningioma.  Seen today in routine follow-up she is doing well she is having no back pain.  Still has some slight paresthesia in the right anterior thigh which is postsurgical.  She is ambulating wel.  COMPLICATIONS OF TREATMENT: none  FOLLOW UP COMPLIANCE: keeps appointments   PHYSICAL EXAM:  BP 125/72 (BP Location: Left Arm, Patient Position: Sitting, Cuff Size: Large)    Pulse 73    Temp (!) 97 F (36.1 C) (Tympanic)    Resp 16    Wt 218 lb 6.4 oz (99.1 kg)    BMI 33.70 kg/m  Motor and sensory levels on the lower extremities are equal and symmetric proprioception is intact.  Deep palpation of her spine does not elicit pain.  Well-developed well-nourished patient in NAD. HEENT reveals PERLA, EOMI, discs not visualized.  Oral cavity is clear. No oral mucosal lesions are identified. Neck is clear without evidence of cervical or supraclavicular adenopathy. Lungs are clear to A&P. Cardiac examination is essentially unremarkable with regular rate and rhythm without murmur rub or thrill. Abdomen is benign with no organomegaly or masses noted. Motor sensory and DTR levels are equal and symmetric in the upper and lower extremities. Cranial nerves II through XII are grossly intact. Proprioception is intact. No peripheral adenopathy or edema is identified. No motor or sensory levels are noted. Crude visual fields are within normal range.  RADIOLOGY RESULTS: No current films for review.  She has an MRI scheduled probably in March  PLAN: Present  time patient continues to do well.  She is followed at [facility] by her neurosurgical team.  I have asked to see her back in 4 to 5 months for follow-up.  I will review any MRI scans I perform at [facility].  Patient knows to call with any concerns at any time.  I would like to take this opportunity to thank you for allowing me to participate in the care of your patient.Noreene Filbert, MD

## 2021-07-13 ENCOUNTER — Other Ambulatory Visit: Payer: Self-pay | Admitting: Neurosurgery

## 2021-07-13 DIAGNOSIS — D329 Benign neoplasm of meninges, unspecified: Secondary | ICD-10-CM

## 2021-07-19 ENCOUNTER — Encounter: Payer: 59 | Admitting: Family Medicine

## 2021-07-23 ENCOUNTER — Other Ambulatory Visit: Payer: Self-pay

## 2021-07-23 ENCOUNTER — Encounter: Payer: Self-pay | Admitting: Family Medicine

## 2021-07-23 ENCOUNTER — Ambulatory Visit (INDEPENDENT_AMBULATORY_CARE_PROVIDER_SITE_OTHER): Payer: 59 | Admitting: Family Medicine

## 2021-07-23 VITALS — BP 122/80 | HR 80 | Ht 67.5 in | Wt 203.0 lb

## 2021-07-23 DIAGNOSIS — Z0000 Encounter for general adult medical examination without abnormal findings: Secondary | ICD-10-CM

## 2021-07-23 DIAGNOSIS — Z1231 Encounter for screening mammogram for malignant neoplasm of breast: Secondary | ICD-10-CM

## 2021-07-23 DIAGNOSIS — E782 Mixed hyperlipidemia: Secondary | ICD-10-CM

## 2021-07-23 DIAGNOSIS — Z23 Encounter for immunization: Secondary | ICD-10-CM | POA: Diagnosis not present

## 2021-07-23 DIAGNOSIS — E039 Hypothyroidism, unspecified: Secondary | ICD-10-CM

## 2021-07-23 MED ORDER — LEVOTHYROXINE SODIUM 200 MCG PO TABS: ORAL_TABLET | ORAL | 1 refills | Status: DC

## 2021-07-23 MED ORDER — EZETIMIBE 10 MG PO TABS: ORAL_TABLET | ORAL | 1 refills | Status: DC

## 2021-07-23 NOTE — Progress Notes (Signed)
Date:  07/23/2021   Name:  Karen Franco   DOB:  03/11/1961   MRN:  [id number]   Chief Complaint: Annual Exam (For work)  Patient is a 61 year old female who presents for a comprehensive physical exam. The patient reports the following problems: none. Health maintenance has been reviewed up to date.  Karen Franco is a 61 y.o. female who presents today for her Complete Annual Exam. She feels well. She reports exercising going to the gym. She reports she is sleeping well.     Lab Results  Component Value Date   NA 140 07/17/2020   K 4.7 07/17/2020   CO2 23 07/17/2020   GLUCOSE 82 07/17/2020   BUN 12 07/17/2020   CREATININE 0.67 07/17/2020   CALCIUM 9.6 07/17/2020   EGFR 102 07/17/2020   GFRNONAA 98 07/27/2019   Lab Results  Component Value Date   CHOL 205 (H) 07/17/2020   HDL 70 07/17/2020   LDLCALC 120 (H) 07/17/2020   TRIG 86 07/17/2020   CHOLHDL 3.2 10/07/2017   Lab Results  Component Value Date   TSH 0.091 (L) 01/16/2021   No results found for: HGBA1C Lab Results  Component Value Date   WBC 12.7 (H) 03/01/2019   HGB 14.0 03/01/2019   HCT 42.8 03/01/2019   MCV 87.2 03/01/2019   PLT 397 03/01/2019   Lab Results  Component Value Date   ALT 16 07/17/2020   AST 16 07/17/2020   ALKPHOS 112 01/16/2021   BILITOT 0.2 07/17/2020   No results found for: 25OHVITD2, 25OHVITD3, VD25OH   Review of Systems  Constitutional: Negative.  Negative for chills, fatigue, fever and unexpected weight change.  HENT:  Negative for congestion, ear discharge, ear pain, rhinorrhea, sinus pressure, sneezing and sore throat.   Eyes:  Negative for photophobia, pain, discharge, redness and itching.  Respiratory:  Negative for cough, shortness of breath, wheezing and stridor.   Gastrointestinal:  Negative for abdominal pain, blood in stool, constipation, diarrhea, nausea and vomiting.  Endocrine: Negative for cold intolerance, heat intolerance, polydipsia, polyphagia and  polyuria.  Genitourinary:  Negative for dysuria, flank pain, frequency, hematuria, menstrual problem, pelvic pain, urgency, vaginal bleeding and vaginal discharge.  Musculoskeletal:  Negative for arthralgias, back pain and myalgias.  Skin:  Negative for rash.  Allergic/Immunologic: Negative for environmental allergies and food allergies.  Neurological:  Negative for dizziness, weakness, light-headedness, numbness and headaches.  Hematological:  Negative for adenopathy. Does not bruise/bleed easily.  Psychiatric/Behavioral:  Negative for dysphoric mood. The patient is not nervous/anxious.    Patient Active Problem List   Diagnosis Date Noted   H/O total hysterectomy 07/17/2020   Polyp of sigmoid colon    History of colonic polyps 02/14/2020   Liver lesion, right lobe 05/06/2019   Abnormal MRI, thoracic spine 05/06/2019   Melanoma in situ of pinna of left ear ([place]) 05/06/2019   S/P laparoscopic cholecystectomy 03/02/2019   Acute cholecystitis 03/01/2019   Special screening for malignant neoplasms, colon    Benign neoplasm of ascending colon    Acquired hypothyroidism 12/07/2014   Taking medication for chronic disease 12/07/2014    No Known Allergies  Past Surgical History:  Procedure Laterality Date   APPENDECTOMY     BREAST CYST ASPIRATION     breast cyst/ not sure which side-neg   CESAREAN SECTION     x 2   CHOLECYSTECTOMY N/A 03/01/2019   Procedure: LAPAROSCOPIC CHOLECYSTECTOMY;  Surgeon: Fredirick Maudlin, MD;  Location: [facility]  ORS;  Service: General;  Laterality: N/A;   COLONOSCOPY WITH PROPOFOL N/A 01/20/2015   Procedure: COLONOSCOPY WITH PROPOFOL;  Surgeon: Lucilla Lame, MD;  Location: [place];  Service: Endoscopy;  Laterality: N/A;   COLONOSCOPY WITH PROPOFOL N/A 03/17/2020   Procedure: COLONOSCOPY WITH PROPOFOL;  Surgeon: Lucilla Lame, MD;  Location: [place];  Service: Endoscopy;  Laterality: N/A;  priority 4   MOHS SURGERY  05/2015   L) ear    POLYPECTOMY  01/20/2015   Procedure: POLYPECTOMY;  Surgeon: Lucilla Lame, MD;  Location: [place];  Service: Endoscopy;;   POLYPECTOMY  03/17/2020   Procedure: POLYPECTOMY;  Surgeon: Lucilla Lame, MD;  Location: [place];  Service: Endoscopy;;   THYROIDECTOMY     VAGINAL HYSTERECTOMY      Social History   Tobacco Use   Smoking status: Never   Smokeless tobacco: Never  Vaping Use   Vaping Use: Never used  Substance Use Topics   Alcohol use: Yes    Alcohol/week: 0.0 standard drinks    Comment: 2 glass wine/month   Drug use: No     Medication list has been reviewed and updated.  Current Meds  Medication Sig   Calcium Carbonate-Vitamin D (CALCIUM-VITAMIN D) 500-200 MG-UNIT per tablet Take 1 tablet by mouth daily.   cholecalciferol (VITAMIN D) 1000 units tablet Take 1,000 Units by mouth daily.   ezetimibe (ZETIA) 10 MG tablet TAKE 1 TABLET(10 MG) BY MOUTH DAILY   levothyroxine (SYNTHROID) 200 MCG tablet TAKE 1 TABLET BY MOUTH DAILY BEFORE BREAKFAST.   Multiple Vitamin (MULTIVITAMIN) capsule Take 1 capsule by mouth daily.   vitamin B-12 (CYANOCOBALAMIN) 1000 MCG tablet Take 1,000 mcg by mouth daily.    PHQ 2/9 Scores 07/23/2021 01/16/2021 03/21/2020 01/27/2020  PHQ - 2 Score 0 0 0 0  PHQ- 9 Score 0 0 0 0    GAD 7 : Generalized Anxiety Score 07/23/2021 01/16/2021 03/21/2020 01/27/2020  Nervous, Anxious, on Edge 0 0 0 0  Control/stop worrying 0 0 0 0  Worry too much - different things 0 0 0 0  Trouble relaxing 0 0 0 0  Restless 0 0 0 0  Easily annoyed or irritable 0 0 0 0  Afraid - awful might happen 0 0 0 0  Total GAD 7 Score 0 0 0 0  Anxiety Difficulty Not difficult at all - - -    BP Readings from Last 3 Encounters:  07/23/21 122/80  06/06/21 125/72  03/05/21 (!) 148/73    Physical Exam Vitals and nursing note reviewed.  Constitutional:      Appearance: Normal appearance. She is well-developed, well-groomed and overweight.  HENT:     Head:  Normocephalic.     Jaw: There is normal jaw occlusion.     Right Ear: Hearing, tympanic membrane, ear canal and external ear normal.     Left Ear: Hearing, tympanic membrane, ear canal and external ear normal.     Nose: Nose normal.     Mouth/Throat:     Lips: Pink.     Mouth: Mucous membranes are moist.     Dentition: Normal dentition.     Tongue: No lesions.     Palate: No mass.     Pharynx: Oropharynx is clear. Uvula midline.  Eyes:     General: Lids are normal. Vision grossly intact. Gaze aligned appropriately. No scleral icterus.       Left eye: No foreign body or hordeolum.     Extraocular  Movements: Extraocular movements intact.     Conjunctiva/sclera: Conjunctivae normal.     Right eye: Right conjunctiva is not injected.     Left eye: Left conjunctiva is not injected.     Pupils: Pupils are equal, round, and reactive to light.     Funduscopic exam:    Right eye: Red reflex present.        Left eye: Red reflex present. Neck:     Thyroid: No thyroid mass, thyromegaly or thyroid tenderness.     Vascular: Normal carotid pulses. No carotid bruit, hepatojugular reflux or JVD.     Trachea: Trachea and phonation normal. No tracheal deviation.  Cardiovascular:     Rate and Rhythm: Normal rate and regular rhythm.     Chest Wall: PMI is not displaced. No thrill.     Pulses: Normal pulses.          Carotid pulses are 2+ on the right side and 2+ on the left side.      Radial pulses are 2+ on the right side and 2+ on the left side.       Femoral pulses are 2+ on the right side and 2+ on the left side.      Dorsalis pedis pulses are 2+ on the right side and 2+ on the left side.       Posterior tibial pulses are 2+ on the right side and 2+ on the left side.     Heart sounds: Normal heart sounds, S1 normal and S2 normal. No murmur heard. No systolic murmur is present.  No diastolic murmur is present.    No friction rub. No gallop. No S3 or S4 sounds.  Pulmonary:     Effort: Pulmonary  effort is normal. No respiratory distress.     Breath sounds: Normal breath sounds. No decreased breath sounds, wheezing, rhonchi or rales.  Chest:  Breasts:    Right: Normal.     Left: Normal.  Abdominal:     General: Bowel sounds are normal.     Palpations: Abdomen is soft. There is no hepatomegaly, splenomegaly or mass.     Tenderness: There is no abdominal tenderness. There is no guarding or rebound.     Hernia: No hernia is present. There is no hernia in the umbilical area or ventral area.  Genitourinary:    Rectum: Normal. Guaiac result negative. No mass.  Musculoskeletal:        General: No tenderness. Normal range of motion.     Cervical back: Normal, full passive range of motion without pain, normal range of motion and neck supple.     Thoracic back: Normal.     Lumbar back: Normal.     Right lower leg: No edema.     Left lower leg: No edema.  Lymphadenopathy:     Head:     Right side of head: No submental, submandibular or tonsillar adenopathy.     Left side of head: No submental, submandibular or tonsillar adenopathy.     Cervical: No cervical adenopathy.     Right cervical: No superficial, deep or posterior cervical adenopathy.    Left cervical: No superficial, deep or posterior cervical adenopathy.     Upper Body:     Right upper body: No supraclavicular or axillary adenopathy.     Left upper body: No supraclavicular or axillary adenopathy.  Skin:    General: Skin is warm.     Findings: No rash.  Neurological:     General:  No focal deficit present.     Mental Status: She is alert and oriented to person, place, and time.     Cranial Nerves: Cranial nerves 2-12 are intact. No cranial nerve deficit.     Sensory: Sensation is intact.     Motor: Motor function is intact.     Coordination: Romberg sign negative.     Deep Tendon Reflexes: Reflexes normal.     Reflex Scores:      Tricep reflexes are 2+ on the right side and 2+ on the left side.      Bicep reflexes are  2+ on the right side and 2+ on the left side.      Brachioradialis reflexes are 2+ on the right side and 2+ on the left side.      Patellar reflexes are 2+ on the right side and 2+ on the left side.      Achilles reflexes are 2+ on the right side and 2+ on the left side. Psychiatric:        Mood and Affect: Mood is not anxious or depressed.        Behavior: Behavior is cooperative.    Wt Readings from Last 3 Encounters:  07/23/21 203 lb (92.1 kg)  06/06/21 218 lb 6.4 oz (99.1 kg)  03/05/21 227 lb 12.8 oz (103.3 kg)    BP 122/80    Pulse 80    Ht 5' 7.5" (1.715 m)    Wt 203 lb (92.1 kg)    BMI 31.33 kg/m   Assessment and Plan: Patient is a 61year old female who presents for a comprehensive physical exam. The patient reports the following problems: None. Health maintenance has been reviewed up-to-date.Karen Franco is a 61 y.o. female who presents today for her Complete Annual Exam. She feels well. She reports exercising as tolerated. She reports she is sleeping well.   1. Annual physical exam Immunizations are reviewed and recommendations provided.   Age appropriate screening tests are discussed. Counseling given for risk factor reduction interventions.  No subjective/objective concerns noted during HPI, review of past medical history, review of systems, and physical exam.  2. Breast cancer screening by mammogram Discussed and administered. - MM 3D SCREEN BREAST BILATERAL  3. Mixed hyperlipidemia Noted to have mild elevation in the past and we will obtain lipid panel.  4. Acquired hypothyroidism Chronic.  Controlled.  Stable.  We will continue on current dose of levothyroxine pending TSH evaluation.  5. Need for shingles vaccine Discussed and administered. - Varicella-zoster vaccine IM (Shingrix)

## 2021-07-26 ENCOUNTER — Other Ambulatory Visit: Payer: Self-pay

## 2021-07-26 ENCOUNTER — Ambulatory Visit
Admission: RE | Admit: 2021-07-26 | Discharge: 2021-07-26 | Disposition: A | Discharge status: Home / Self Care | Payer: 59 | Source: Ambulatory Visit | Attending: Neurosurgery | Admitting: Neurosurgery

## 2021-07-26 DIAGNOSIS — D329 Benign neoplasm of meninges, unspecified: Secondary | ICD-10-CM | POA: Diagnosis present

## 2021-07-26 MED ORDER — GADOBUTROL 1 MMOL/ML IV SOLN
9.0000 mL | Freq: Once | INTRAVENOUS | Status: AC | PRN
  Administered 2021-07-26: 9 mL via INTRAVENOUS

## 2021-08-06 ENCOUNTER — Other Ambulatory Visit: Payer: Self-pay

## 2021-08-06 ENCOUNTER — Ambulatory Visit
Admission: RE | Admit: 2021-08-06 | Discharge: 2021-08-06 | Disposition: A | Discharge status: Home / Self Care | Payer: 59 | Source: Ambulatory Visit | Attending: Family Medicine | Admitting: Family Medicine

## 2021-08-06 ENCOUNTER — Telehealth: Payer: Self-pay | Admitting: Family Medicine

## 2021-08-06 DIAGNOSIS — Z1231 Encounter for screening mammogram for malignant neoplasm of breast: Secondary | ICD-10-CM | POA: Insufficient documentation

## 2021-08-06 DIAGNOSIS — E782 Mixed hyperlipidemia: Secondary | ICD-10-CM

## 2021-08-06 DIAGNOSIS — Z0000 Encounter for general adult medical examination without abnormal findings: Secondary | ICD-10-CM

## 2021-08-06 DIAGNOSIS — E039 Hypothyroidism, unspecified: Secondary | ICD-10-CM

## 2021-08-06 NOTE — Telephone Encounter (Signed)
Copied from [phone]. Topic: General - Inquiry ?>> Aug 06, 2021  9:43 AM Greggory Keen D wrote: ?Reason for CRM:  ?Pt called saying she will come in Wed 3/29 to do her labs that am ?

## 2021-08-09 ENCOUNTER — Other Ambulatory Visit: Payer: Self-pay

## 2021-08-09 DIAGNOSIS — E039 Hypothyroidism, unspecified: Secondary | ICD-10-CM

## 2021-08-09 LAB — HEMOGLOBIN A1C
Est. average glucose Bld gHb Est-mCnc: 103 mg/dL
Hgb A1c MFr Bld: 5.2 % (ref 4.8–5.6)

## 2021-08-09 LAB — COMPREHENSIVE METABOLIC PANEL
ALT: 29 IU/L (ref 0–32)
AST: 27 IU/L (ref 0–40)
Albumin/Globulin Ratio: 1.7 (ref 1.2–2.2)
Albumin: 4.4 g/dL (ref 3.8–4.9)
Alkaline Phosphatase: 138 IU/L — ABNORMAL HIGH (ref 44–121)
BUN/Creatinine Ratio: 18 (ref 9–23)
BUN: 13 mg/dL (ref 6–24)
Bilirubin Total: 0.4 mg/dL (ref 0.0–1.2)
CO2: 25 mmol/L (ref 20–29)
Calcium: 10 mg/dL (ref 8.7–10.2)
Chloride: 104 mmol/L (ref 96–106)
Creatinine, Ser: 0.72 mg/dL (ref 0.57–1.00)
Globulin, Total: 2.6 g/dL (ref 1.5–4.5)
Glucose: 96 mg/dL (ref 70–99)
Potassium: 5.7 mmol/L — ABNORMAL HIGH (ref 3.5–5.2)
Sodium: 143 mmol/L (ref 134–144)
Total Protein: 7 g/dL (ref 6.0–8.5)
eGFR: 97 mL/min/{1.73_m2} (ref >59)

## 2021-08-09 LAB — LIPID PANEL WITH LDL/HDL RATIO
Cholesterol, Total: 195 mg/dL (ref 100–199)
HDL: 62 mg/dL (ref >39)
LDL Chol Calc (NIH): 119 mg/dL — ABNORMAL HIGH (ref 0–99)
LDL/HDL Ratio: 1.9 ratio (ref 0.0–3.2)
Triglycerides: 78 mg/dL (ref 0–149)
VLDL Cholesterol Cal: 14 mg/dL (ref 5–40)

## 2021-08-09 LAB — TSH: TSH: 0.196 u[IU]/mL — ABNORMAL LOW (ref 0.450–4.500)

## 2021-08-09 MED ORDER — LEVOTHYROXINE SODIUM 88 MCG PO TABS
88.0000 ug | ORAL_TABLET | Freq: Every day | ORAL | 1 refills | Status: DC
Start: 2021-08-09 — End: 2021-09-24

## 2021-08-09 MED ORDER — LEVOTHYROXINE SODIUM 100 MCG PO TABS
100.0000 ug | ORAL_TABLET | Freq: Every day | ORAL | 1 refills | Status: DC
Start: 2021-08-09 — End: 2021-09-24

## 2021-08-09 NOTE — Progress Notes (Signed)
Sent in 100 and 88 mcg levothyroxine ?

## 2021-08-13 ENCOUNTER — Telehealth: Payer: Self-pay | Admitting: Family Medicine

## 2021-08-13 NOTE — Telephone Encounter (Signed)
Copied from [place] [id number]. Topic: General - Inquiry ?>> Aug 13, 2021  8:17 AM Scherrie Gerlach wrote: ?Reason for CRM: pt states the date her labs were done is missing on the health assessment form you sent in for her.  Pt would like you to resubmit her health assessment  form w/ lab dates please ?

## 2021-09-24 ENCOUNTER — Encounter: Payer: Self-pay | Admitting: Family Medicine

## 2021-09-24 ENCOUNTER — Ambulatory Visit (INDEPENDENT_AMBULATORY_CARE_PROVIDER_SITE_OTHER): Payer: 59 | Admitting: Family Medicine

## 2021-09-24 VITALS — BP 110/80 | HR 76 | Ht 67.5 in | Wt 189.0 lb

## 2021-09-24 DIAGNOSIS — Z23 Encounter for immunization: Secondary | ICD-10-CM | POA: Diagnosis not present

## 2021-09-24 DIAGNOSIS — E039 Hypothyroidism, unspecified: Secondary | ICD-10-CM

## 2021-09-24 MED ORDER — LEVOTHYROXINE SODIUM 100 MCG PO TABS: 100.0000 ug | ORAL_TABLET | Freq: Every day | ORAL | 5 refills | Status: DC

## 2021-09-24 MED ORDER — LEVOTHYROXINE SODIUM 88 MCG PO TABS: 88.0000 ug | ORAL_TABLET | Freq: Every day | ORAL | 5 refills | Status: DC

## 2021-09-24 NOTE — Progress Notes (Signed)
? ? ?Date:  09/24/2021  ? ?Name:  Karen Franco   DOB:  August 24, 1960   MRN:  [id number] ? ? ?Chief Complaint: Hypothyroidism and shingles vaccine ? ?Thyroid Problem ?Presents for follow-up visit. Patient reports no anxiety, cold intolerance, constipation, depressed mood, diaphoresis, diarrhea, dry skin, fatigue, hair loss, hoarse voice, leg swelling, menstrual problem, nail problem, palpitations, tremors, visual change, weight gain or weight loss. The symptoms have been stable.  ? ?Lab Results  ?Component Value Date  ? NA 143 08/08/2021  ? K 5.7 (H) 08/08/2021  ? CO2 25 08/08/2021  ? GLUCOSE 96 08/08/2021  ? BUN 13 08/08/2021  ? CREATININE 0.72 08/08/2021  ? CALCIUM 10.0 08/08/2021  ? EGFR 97 08/08/2021  ? GFRNONAA 98 07/27/2019  ? ?Lab Results  ?Component Value Date  ? CHOL 195 08/08/2021  ? HDL 62 08/08/2021  ? LDLCALC 119 (H) 08/08/2021  ? TRIG 78 08/08/2021  ? CHOLHDL 3.2 10/07/2017  ? ?Lab Results  ?Component Value Date  ? TSH 0.196 (L) 08/08/2021  ? ?Lab Results  ?Component Value Date  ? HGBA1C 5.2 08/08/2021  ? ?Lab Results  ?Component Value Date  ? WBC 12.7 (H) 03/01/2019  ? HGB 14.0 03/01/2019  ? HCT 42.8 03/01/2019  ? MCV 87.2 03/01/2019  ? PLT 397 03/01/2019  ? ?Lab Results  ?Component Value Date  ? ALT 29 08/08/2021  ? AST 27 08/08/2021  ? ALKPHOS 138 (H) 08/08/2021  ? BILITOT 0.4 08/08/2021  ? ?No results found for: 25OHVITD2, [place], VD25OH  ? ?Review of Systems  ?Constitutional:  Negative for chills, diaphoresis, fatigue, fever, weight gain and weight loss.  ?HENT:  Negative for drooling, ear discharge, ear pain, hoarse voice and sore throat.   ?Respiratory:  Negative for cough, shortness of breath and wheezing.   ?Cardiovascular:  Negative for chest pain, palpitations and leg swelling.  ?Gastrointestinal:  Negative for abdominal pain, blood in stool, constipation, diarrhea and nausea.  ?Endocrine: Negative for cold intolerance and polydipsia.  ?Genitourinary:  Negative for dysuria, frequency,  hematuria, menstrual problem and urgency.  ?Musculoskeletal:  Negative for back pain, myalgias and neck pain.  ?Skin:  Negative for rash.  ?Allergic/Immunologic: Negative for environmental allergies.  ?Neurological:  Negative for dizziness, tremors and headaches.  ?Hematological:  Does not bruise/bleed easily.  ?Psychiatric/Behavioral:  Negative for suicidal ideas. The patient is not nervous/anxious.   ? ?Patient Active Problem List  ? Diagnosis Date Noted  ? H/O total hysterectomy 07/17/2020  ? Polyp of sigmoid colon   ? History of colonic polyps 02/14/2020  ? Liver lesion, right lobe 05/06/2019  ? Abnormal MRI, thoracic spine 05/06/2019  ? Melanoma in situ of pinna of left ear ([place]) 05/06/2019  ? S/P laparoscopic cholecystectomy 03/02/2019  ? Acute cholecystitis 03/01/2019  ? Special screening for malignant neoplasms, colon   ? Benign neoplasm of ascending colon   ? Acquired hypothyroidism 12/07/2014  ? Taking medication for chronic disease 12/07/2014  ? ? ?No Known Allergies ? ?Past Surgical History:  ?Procedure Laterality Date  ? APPENDECTOMY    ? BREAST CYST ASPIRATION    ? breast cyst/ not sure which side-neg  ? CESAREAN SECTION    ? x 2  ? CHOLECYSTECTOMY N/A 03/01/2019  ? Procedure: LAPAROSCOPIC CHOLECYSTECTOMY;  Surgeon: Fredirick Maudlin, MD;  Location: [facility] ORS;  Service: General;  Laterality: N/A;  ? COLONOSCOPY WITH PROPOFOL N/A 01/20/2015  ? Procedure: COLONOSCOPY WITH PROPOFOL;  Surgeon: Lucilla Lame, MD;  Location: [place];  Service: Endoscopy;  Laterality: N/A;  ? COLONOSCOPY WITH PROPOFOL N/A 03/17/2020  ? Procedure: COLONOSCOPY WITH PROPOFOL;  Surgeon: Lucilla Lame, MD;  Location: [place];  Service: Endoscopy;  Laterality: N/A;  priority 4  ? MOHS SURGERY  05/2015  ? L) ear  ? POLYPECTOMY  01/20/2015  ? Procedure: POLYPECTOMY;  Surgeon: Lucilla Lame, MD;  Location: [place];  Service: Endoscopy;;  ? POLYPECTOMY  03/17/2020  ? Procedure: POLYPECTOMY;  Surgeon: Lucilla Lame, MD;  Location: [place];  Service: Endoscopy;;  ? THYROIDECTOMY    ? VAGINAL HYSTERECTOMY    ? ? ?Social History  ? ?Tobacco Use  ? Smoking status: Never  ? Smokeless tobacco: Never  ?Vaping Use  ? Vaping Use: Never used  ?Substance Use Topics  ? Alcohol use: Yes  ?  Alcohol/week: 0.0 standard drinks  ?  Comment: 2 glass wine/month  ? Drug use: No  ? ? ? ?Medication list has been reviewed and updated. ? ?Current Meds  ?Medication Sig  ? Calcium Carbonate-Vitamin D (CALCIUM-VITAMIN D) 500-200 MG-UNIT per tablet Take 1 tablet by mouth daily.  ? cholecalciferol (VITAMIN D) 1000 units tablet Take 1,000 Units by mouth daily.  ? ezetimibe (ZETIA) 10 MG tablet TAKE 1 TABLET(10 MG) BY MOUTH DAILY  ? levothyroxine (SYNTHROID) 100 MCG tablet Take 1 tablet (100 mcg total) by mouth daily. Take with 55mg=188mcg  ? levothyroxine (SYNTHROID) 88 MCG tablet Take 1 tablet (88 mcg total) by mouth daily. Take with 1051m=188mcg daily  ? Multiple Vitamin (MULTIVITAMIN) capsule Take 1 capsule by mouth daily.  ? vitamin B-12 (CYANOCOBALAMIN) 1000 MCG tablet Take 1,000 mcg by mouth daily.  ? ? ? ?  09/24/2021  ? 10:28 AM 07/23/2021  ?  9:50 AM 01/16/2021  ? 10:29 AM 03/21/2020  ?  1:21 PM  ?GAD 7 : Generalized Anxiety Score  ?Nervous, Anxious, on Edge 0 0 0 0  ?Control/stop worrying 0 0 0 0  ?Worry too much - different things 0 0 0 0  ?Trouble relaxing 0 0 0 0  ?Restless 0 0 0 0  ?Easily annoyed or irritable 0 0 0 0  ?Afraid - awful might happen 0 0 0 0  ?Total GAD 7 Score 0 0 0 0  ?Anxiety Difficulty Not difficult at all Not difficult at all    ? ? ? ?  09/24/2021  ? 10:28 AM  ?Depression screen PHQ 2/9  ?Decreased Interest 0  ?Down, Depressed, Hopeless 0  ?PHQ - 2 Score 0  ?Altered sleeping 0  ?Tired, decreased energy 0  ?Change in appetite 0  ?Feeling bad or failure about yourself  0  ?Trouble concentrating 0  ?Moving slowly or fidgety/restless 0  ?Suicidal thoughts 0  ?PHQ-9 Score 0  ?Difficult doing work/chores Not  difficult at all  ? ? ?BP Readings from Last 3 Encounters:  ?09/24/21 110/80  ?07/23/21 122/80  ?06/06/21 125/72  ? ? ?Physical Exam ?Vitals and nursing note reviewed. Exam conducted with a chaperone present.  ?Constitutional:   ?   General: She is not in acute distress. ?   Appearance: She is not diaphoretic.  ?HENT:  ?   Head: Normocephalic and atraumatic.  ?   Right Ear: Tympanic membrane, ear canal and external ear normal.  ?   Left Ear: Tympanic membrane, ear canal and external ear normal.  ?   Nose: Nose normal. No congestion or rhinorrhea.  ?Eyes:  ?   General:     ?   Right  eye: No discharge.     ?   Left eye: No discharge.  ?   Conjunctiva/sclera: Conjunctivae normal.  ?   Pupils: Pupils are equal, round, and reactive to light.  ?Neck:  ?   Thyroid: No thyromegaly.  ?   Vascular: No JVD.  ?Cardiovascular:  ?   Rate and Rhythm: Normal rate and regular rhythm.  ?   Heart sounds: Normal heart sounds. No murmur heard. ?  No friction rub. No gallop.  ?Pulmonary:  ?   Effort: Pulmonary effort is normal.  ?   Breath sounds: Normal breath sounds. No wheezing, rhonchi or rales.  ?Chest:  ?   Chest wall: No tenderness.  ?Abdominal:  ?   General: Bowel sounds are normal.  ?   Palpations: Abdomen is soft. There is no mass.  ?   Tenderness: There is no abdominal tenderness. There is no guarding.  ?Musculoskeletal:     ?   General: Normal range of motion.  ?   Cervical back: Normal range of motion and neck supple.  ?Lymphadenopathy:  ?   Cervical: No cervical adenopathy.  ?Skin: ?   General: Skin is warm and dry.  ?Neurological:  ?   Mental Status: She is alert.  ?   Deep Tendon Reflexes: Reflexes are normal and symmetric.  ? ? ?Wt Readings from Last 3 Encounters:  ?09/24/21 189 lb (85.7 kg)  ?07/23/21 203 lb (92.1 kg)  ?06/06/21 218 lb 6.4 oz (99.1 kg)  ? ? ?BP 110/80   Pulse 76   Ht 5' 7.5" (1.715 m)   Wt 189 lb (85.7 kg)   BMI 29.16 kg/m?  ? ?Assessment and Plan: ? ?1. Acquired hypothyroidism ?Chronic.   Controlled.  Stable.  Currently on 188 mcg daily.  Continue at current dosing and will check TSH for verification of dosing. ?- TSH ?- levothyroxine (SYNTHROID) 100 MCG tablet; Take 1 tablet (100 mcg total) by mouth daily. Take

## 2021-09-25 LAB — TSH: TSH: 0.074 u[IU]/mL — ABNORMAL LOW (ref 0.450–4.500)

## 2021-10-15 ENCOUNTER — Other Ambulatory Visit: Payer: Self-pay

## 2021-10-15 DIAGNOSIS — E782 Mixed hyperlipidemia: Secondary | ICD-10-CM

## 2021-10-15 MED ORDER — EZETIMIBE 10 MG PO TABS
ORAL_TABLET | ORAL | 1 refills | Status: DC
Start: 2021-10-15 — End: 2021-12-17

## 2021-11-05 ENCOUNTER — Ambulatory Visit
Admission: RE | Admit: 2021-11-05 | Discharge: 2021-11-05 | Disposition: A | Discharge status: Home / Self Care | Payer: 59 | Source: Ambulatory Visit | Attending: Radiation Oncology | Admitting: Radiation Oncology

## 2021-11-05 ENCOUNTER — Encounter: Payer: Self-pay | Admitting: Radiation Oncology

## 2021-11-05 VITALS — BP 119/69 | HR 68 | Temp 96.9°F | Resp 18 | Ht 67.5 in | Wt 182.9 lb

## 2021-11-05 DIAGNOSIS — D329 Benign neoplasm of meninges, unspecified: Secondary | ICD-10-CM

## 2021-11-05 DIAGNOSIS — D429 Neoplasm of uncertain behavior of meninges, unspecified: Secondary | ICD-10-CM | POA: Insufficient documentation

## 2021-11-05 DIAGNOSIS — Z923 Personal history of irradiation: Secondary | ICD-10-CM | POA: Insufficient documentation

## 2021-11-28 ENCOUNTER — Encounter: Payer: Self-pay | Admitting: Family Medicine

## 2021-11-28 ENCOUNTER — Ambulatory Visit (INDEPENDENT_AMBULATORY_CARE_PROVIDER_SITE_OTHER): Payer: 59 | Admitting: Family Medicine

## 2021-11-28 VITALS — BP 122/78 | HR 110 | Temp 98.6°F | Ht 67.5 in | Wt 180.0 lb

## 2021-11-28 DIAGNOSIS — R051 Acute cough: Secondary | ICD-10-CM

## 2021-11-28 DIAGNOSIS — U071 COVID-19: Secondary | ICD-10-CM

## 2021-11-28 MED ORDER — MOLNUPIRAVIR EUA 200MG CAPSULE: 4.0000 | ORAL_CAPSULE | Freq: Two times a day (BID) | ORAL | 0 refills | Status: AC

## 2021-11-28 MED ORDER — PROMETHAZINE-DM 6.25-15 MG/5ML PO SYRP
5.0000 mL | ORAL_SOLUTION | Freq: Four times a day (QID) | ORAL | 0 refills | Status: DC | PRN
Start: 2021-11-28 — End: 2022-02-15

## 2021-11-28 NOTE — Progress Notes (Signed)
Date:  11/28/2021   Name:  Karen Franco   DOB:  1960-10-17   MRN:  [id number]   Chief Complaint: Cough (Cough, Congestion, Body Aches, Sinus. )  Cough This is a new problem. The current episode started in the past 7 days (monday). The problem occurs every few minutes. Associated symptoms include chills, headaches, myalgias, nasal congestion and postnasal drip. Pertinent negatives include no chest pain, fever, rash, rhinorrhea, sore throat, shortness of breath or wheezing. Associated symptoms comments: Mild tightness. The symptoms are aggravated by pollens. The treatment provided mild relief.    Lab Results  Component Value Date   NA 143 08/08/2021   K 5.7 (H) 08/08/2021   CO2 25 08/08/2021   GLUCOSE 96 08/08/2021   BUN 13 08/08/2021   CREATININE 0.72 08/08/2021   CALCIUM 10.0 08/08/2021   EGFR 97 08/08/2021   GFRNONAA 98 07/27/2019   Lab Results  Component Value Date   CHOL 195 08/08/2021   HDL 62 08/08/2021   LDLCALC 119 (H) 08/08/2021   TRIG 78 08/08/2021   CHOLHDL 3.2 10/07/2017   Lab Results  Component Value Date   TSH 0.074 (L) 09/24/2021   Lab Results  Component Value Date   HGBA1C 5.2 08/08/2021   Lab Results  Component Value Date   WBC 12.7 (H) 03/01/2019   HGB 14.0 03/01/2019   HCT 42.8 03/01/2019   MCV 87.2 03/01/2019   PLT 397 03/01/2019   Lab Results  Component Value Date   ALT 29 08/08/2021   AST 27 08/08/2021   ALKPHOS 138 (H) 08/08/2021   BILITOT 0.4 08/08/2021   No results found for: "25OHVITD2", "25OHVITD3", "VD25OH"   Review of Systems  Constitutional:  Positive for chills, diaphoresis and fatigue. Negative for fever.  HENT:  Positive for postnasal drip. Negative for rhinorrhea, sinus pressure and sore throat.   Respiratory:  Positive for cough. Negative for shortness of breath and wheezing.   Cardiovascular:  Negative for chest pain.  Endocrine: Negative for polydipsia and polyuria.  Musculoskeletal:  Positive for arthralgias,  back pain and myalgias.  Skin:  Negative for rash.  Neurological:  Positive for headaches.    Patient Active Problem List   Diagnosis Date Noted   H/O total hysterectomy 07/17/2020   Polyp of sigmoid colon    History of colonic polyps 02/14/2020   Liver lesion, right lobe 05/06/2019   Abnormal MRI, thoracic spine 05/06/2019   Melanoma in situ of pinna of left ear ([place]) 05/06/2019   S/P laparoscopic cholecystectomy 03/02/2019   Acute cholecystitis 03/01/2019   Special screening for malignant neoplasms, colon    Benign neoplasm of ascending colon    Acquired hypothyroidism 12/07/2014   Taking medication for chronic disease 12/07/2014    No Known Allergies  Past Surgical History:  Procedure Laterality Date   APPENDECTOMY     BREAST CYST ASPIRATION     breast cyst/ not sure which side-neg   CESAREAN SECTION     x 2   CHOLECYSTECTOMY N/A 03/01/2019   Procedure: LAPAROSCOPIC CHOLECYSTECTOMY;  Surgeon: Fredirick Maudlin, MD;  Location: [facility];  Service: General;  Laterality: N/A;   COLONOSCOPY WITH PROPOFOL N/A 01/20/2015   Procedure: COLONOSCOPY WITH PROPOFOL;  Surgeon: Lucilla Lame, MD;  Location: [place];  Service: Endoscopy;  Laterality: N/A;   COLONOSCOPY WITH PROPOFOL N/A 03/17/2020   Procedure: COLONOSCOPY WITH PROPOFOL;  Surgeon: Lucilla Lame, MD;  Location: [place];  Service: Endoscopy;  Laterality: N/A;  priority 4  MOHS SURGERY  05/2015   L) ear   POLYPECTOMY  01/20/2015   Procedure: POLYPECTOMY;  Surgeon: Lucilla Lame, MD;  Location: [place];  Service: Endoscopy;;   POLYPECTOMY  03/17/2020   Procedure: POLYPECTOMY;  Surgeon: Lucilla Lame, MD;  Location: [place];  Service: Endoscopy;;   THYROIDECTOMY     VAGINAL HYSTERECTOMY      Social History   Tobacco Use   Smoking status: Never   Smokeless tobacco: Never  Vaping Use   Vaping Use: Never used  Substance Use Topics   Alcohol use: Yes    Alcohol/week: 0.0 standard  drinks of alcohol    Comment: 2 glass wine/month   Drug use: No     Medication list has been reviewed and updated.  Current Meds  Medication Sig   Calcium Carbonate-Vitamin D (CALCIUM-VITAMIN D) 500-200 MG-UNIT per tablet Take 1 tablet by mouth daily.   cholecalciferol (VITAMIN D) 1000 units tablet Take 1,000 Units by mouth daily.   ezetimibe (ZETIA) 10 MG tablet TAKE 1 TABLET(10 MG) BY MOUTH DAILY   levothyroxine (SYNTHROID) 100 MCG tablet Take 1 tablet (100 mcg total) by mouth daily. Take with 44mg=188mcg   levothyroxine (SYNTHROID) 88 MCG tablet Take 1 tablet (88 mcg total) by mouth daily. Take with 1040m=188mcg daily   Multiple Vitamin (MULTIVITAMIN) capsule Take 1 capsule by mouth daily.   vitamin B-12 (CYANOCOBALAMIN) 1000 MCG tablet Take 1,000 mcg by mouth daily.       11/28/2021   11:47 AM 09/24/2021   10:28 AM 07/23/2021    9:50 AM 01/16/2021   10:29 AM  GAD 7 : Generalized Anxiety Score  Nervous, Anxious, on Edge 0 0 0 0  Control/stop worrying 0 0 0 0  Worry too much - different things 0 0 0 0  Trouble relaxing 0 0 0 0  Restless 0 0 0 0  Easily annoyed or irritable 0 0 0 0  Afraid - awful might happen 0 0 0 0  Total GAD 7 Score 0 0 0 0  Anxiety Difficulty Not difficult at all Not difficult at all Not difficult at all        11/28/2021   11:46 AM 09/24/2021   10:28 AM 07/23/2021    9:50 AM  Depression screen PHQ 2/9  Decreased Interest 0 0 0  Down, Depressed, Hopeless 0 0 0  PHQ - 2 Score 0 0 0  Altered sleeping 0 0 0  Tired, decreased energy 0 0 0  Change in appetite 0 0 0  Feeling bad or failure about yourself  0 0 0  Trouble concentrating 0 0 0  Moving slowly or fidgety/restless 0 0 0  Suicidal thoughts 0 0 0  PHQ-9 Score 0 0 0  Difficult doing work/chores Not difficult at all Not difficult at all Not difficult at all    BP Readings from Last 3 Encounters:  11/28/21 122/78  11/05/21 119/69  09/24/21 110/80    Physical Exam HENT:     Head:  Normocephalic.     Nose: Nose normal.  Eyes:     Pupils: Pupils are equal, round, and reactive to light.  Cardiovascular:     Heart sounds: No murmur heard.    No gallop.  Pulmonary:     Breath sounds: No wheezing or rhonchi.  Abdominal:     Tenderness: There is no abdominal tenderness.  Musculoskeletal:     Cervical back: Normal range of motion.     Wt Readings from  Last 3 Encounters:  11/28/21 180 lb (81.6 kg)  11/05/21 182 lb 14.4 oz (83 kg)  09/24/21 189 lb (85.7 kg)    BP 122/78   Pulse (!) 110   Temp 98.6 F (37 C) (Oral)   Ht 5' 7.5" (1.715 m)   Wt 180 lb (81.6 kg)   SpO2 98%   BMI 27.78 kg/m   Assessment and Plan: 1. COVID New onset.  Persistent.  Stable.  Patient began having chills with cough malaise myalgias yesterday and present with symptoms and COVID was noted to be positive.  Examination is negative for pneumonia or other respiratory concerns.  We will treat with molnupiravir 200 mg 4 tablets twice a day for 5 days.  2. Acute cough New onset.  Persistent.  We will treat with promethazine dextromethorphan 1 teaspoon 4 times a day as needed for cough. - promethazine-dextromethorphan (PROMETHAZINE-DM) 6.25-15 MG/5ML syrup; Take 5 mLs by mouth 4 (four) times daily as needed for cough.  Dispense: 118 mL; Refill: 0

## 2021-12-15 ENCOUNTER — Other Ambulatory Visit: Payer: Self-pay | Admitting: Family Medicine

## 2021-12-15 DIAGNOSIS — E782 Mixed hyperlipidemia: Secondary | ICD-10-CM

## 2022-01-16 ENCOUNTER — Emergency Department
Admission: EM | Admit: 2022-01-16 | Discharge: 2022-01-16 | Disposition: A | Discharge status: Home / Self Care | Payer: 59 | Attending: Student in an Organized Health Care Education/Training Program | Admitting: Student in an Organized Health Care Education/Training Program

## 2022-01-16 ENCOUNTER — Encounter: Payer: Self-pay | Admitting: Emergency Medicine

## 2022-01-16 ENCOUNTER — Other Ambulatory Visit: Payer: Self-pay

## 2022-01-16 ENCOUNTER — Emergency Department: Payer: 59

## 2022-01-16 DIAGNOSIS — R0602 Shortness of breath: Secondary | ICD-10-CM | POA: Diagnosis not present

## 2022-01-16 DIAGNOSIS — R0789 Other chest pain: Secondary | ICD-10-CM | POA: Insufficient documentation

## 2022-01-16 DIAGNOSIS — E039 Hypothyroidism, unspecified: Secondary | ICD-10-CM | POA: Insufficient documentation

## 2022-01-16 LAB — HEPATIC FUNCTION PANEL
ALT: 34 U/L (ref 0–44)
AST: 42 U/L — ABNORMAL HIGH (ref 15–41)
Albumin: 3.7 g/dL (ref 3.5–5.0)
Alkaline Phosphatase: 77 U/L (ref 38–126)
Bilirubin, Direct: <0.1 mg/dL (ref 0.0–0.2)
Total Bilirubin: 0.5 mg/dL (ref 0.3–1.2)
Total Protein: 6.6 g/dL (ref 6.5–8.1)

## 2022-01-16 LAB — CBC
HCT: 42.1 % (ref 36.0–46.0)
Hemoglobin: 13.8 g/dL (ref 12.0–15.0)
MCH: 28.6 pg (ref 26.0–34.0)
MCHC: 32.8 g/dL (ref 30.0–36.0)
MCV: 87.2 fL (ref 80.0–100.0)
Platelets: 335 10*3/uL (ref 150–400)
RBC: 4.83 MIL/uL (ref 3.87–5.11)
RDW: 14.7 % (ref 11.5–15.5)
WBC: 7 10*3/uL (ref 4.0–10.5)
nRBC: 0 % (ref 0.0–0.2)

## 2022-01-16 LAB — BASIC METABOLIC PANEL
Anion gap: 8 (ref 5–15)
BUN: 32 mg/dL — ABNORMAL HIGH (ref 6–20)
CO2: 23 mmol/L (ref 22–32)
Calcium: 9.2 mg/dL (ref 8.9–10.3)
Chloride: 109 mmol/L (ref 98–111)
Creatinine, Ser: 0.62 mg/dL (ref 0.44–1.00)
GFR, Estimated: >60 mL/min (ref >60)
Glucose, Bld: 93 mg/dL (ref 70–99)
Potassium: 3.8 mmol/L (ref 3.5–5.1)
Sodium: 140 mmol/L (ref 135–145)

## 2022-01-16 LAB — LIPASE, BLOOD: Lipase: 26 U/L (ref 11–51)

## 2022-01-16 LAB — TROPONIN I (HIGH SENSITIVITY)
Troponin I (High Sensitivity): 4 ng/L (ref <18)
Troponin I (High Sensitivity): 4 ng/L (ref <18)

## 2022-01-16 LAB — D-DIMER, QUANTITATIVE: D-Dimer, Quant: <0.27 ug/mL-FEU (ref 0.00–0.50)

## 2022-01-16 NOTE — ED Provider Notes (Signed)
[facility] Provider Note    Event Date/Time   First MD Initiated Contact with Patient 01/16/22 [phone]     (approximate)   History   Chest Pain   HPI  Karen Franco is a 61 y.o. female   history of hypothyroidism as well as status postcholecystectomy presents to the ER for evaluation of midsternal nonradiating chest pain associated with sweatiness that woke her from sleep lasted several minutes not radiating or tearing through to her back did not radiate to her jaw.  She took some Pepto-Bismol she was concerned might be related to indigestion.  Started having some shortness of breath so she called EMS.  Once EMS had arrived her symptoms had resolved.  She denies any history of cardiac disease but does have family history of cardiac disease.  She did not feel any palpitations.  No fevers.  No cough or congestion.      Physical Exam   Triage Vital Signs: ED Triage Vitals  Enc Vitals Group     BP 01/16/22 0216 (!) 81/65     Pulse Rate 01/16/22 0216 64     Resp 01/16/22 0216 18     Temp 01/16/22 0216 97.9 F (36.6 C)     Temp Source 01/16/22 0216 Oral     SpO2 01/16/22 0216 97 %     Weight 01/16/22 0213 179 lb (81.2 kg)     Height 01/16/22 0213 5' 7.5" (1.715 m)     Head Circumference --      Peak Flow --      Pain Score 01/16/22 0212 0     Pain Loc --      Pain Edu? --      Excl. in [place]? --     Most recent vital signs: Vitals:   01/16/22 0500 01/16/22 0530  BP: 117/68 126/70  Pulse: (!) 54 (!) 59  Resp: 12 14  Temp:    SpO2: 97% 96%     Constitutional: Alert  Eyes: Conjunctivae are normal.  Head: Atraumatic. Nose: No congestion/rhinnorhea. Mouth/Throat: Mucous membranes are moist.   Neck: Painless ROM.  Cardiovascular:   Good peripheral circulation. Respiratory: Normal respiratory effort.  No retractions.  Gastrointestinal: Soft and nontender.  Musculoskeletal:  no deformity Neurologic:  MAE spontaneously. No gross focal  neurologic deficits are appreciated.  Skin:  Skin is warm, dry and intact. No rash noted. Psychiatric: Mood and affect are normal. Speech and behavior are normal.    ED Results / Procedures / Treatments   Labs (all labs ordered are listed, but only abnormal results are displayed) Labs Reviewed  BASIC METABOLIC PANEL - Abnormal; Notable for the following components:      Result Value   BUN 32 (*)    All other components within normal limits  HEPATIC FUNCTION PANEL - Abnormal; Notable for the following components:   AST 42 (*)    All other components within normal limits  CBC  LIPASE, BLOOD  D-DIMER, QUANTITATIVE  TROPONIN I (HIGH SENSITIVITY)  TROPONIN I (HIGH SENSITIVITY)     EKG  ED ECG REPORT I, Merlyn Lot, the attending physician, personally viewed and interpreted this ECG.   Date: 01/16/2022  EKG Time: 2:23  Rate: 70  Rhythm: sinus  Axis: normal  Intervals:normal intervals  ST&T Change: no stemi, no depressions    RADIOLOGY Please see ED Course for my review and interpretation.  I personally reviewed all radiographic images ordered to evaluate for the above acute complaints  and reviewed radiology reports and findings.  These findings were personally discussed with the patient.  Please see medical record for radiology report.    PROCEDURES:  Critical Care performed: No  Procedures   MEDICATIONS ORDERED IN ED: Medications - No data to display   IMPRESSION / MDM / [place] / ED COURSE  I reviewed the triage vital signs and the nursing notes.                              Differential diagnosis includes, but is not limited to, ACS, pericarditis, esophagitis, boerhaaves, pe, dissection, pna, bronchitis, costochondritis  Patient presented to the ER for evaluation of symptoms as described above.  This presenting complaint could reflect a potentially life-threatening illness therefore the patient will be placed on continuous pulse oximetry  and telemetry for monitoring.  Laboratory evaluation will be sent to evaluate for the above complaints.  Is negative.  Low risk  by Wells criteria will order D-dimer to further stratify.  Abdominal exam soft and benign.  Clinical Course as of 01/16/22 0542  Wed Jan 16, 2022  0542 Patient remains well-appearing asymptomatic in no acute distress.  Repeat troponins negative.  D-dimer is negative.  LFTs and lipase normal.  At this point believe she stable and appropriate for outpatient follow-up.  Will be given referral to cardiology.  Discussed return precautions. [PR]    Clinical Course User Index [PR] Merlyn Lot, MD    FINAL CLINICAL IMPRESSION(S) / ED DIAGNOSES   Final diagnoses:  Atypical chest pain     Rx / DC Orders   ED Discharge Orders          Ordered    Ambulatory referral to Cardiology       Comments: If you have not heard from the Cardiology office within the next 72 hours please call [phone].   01/16/22 0539             Note:  This document was prepared using Dragon voice recognition software and may include unintentional dictation errors.    Merlyn Lot, MD 01/16/22 [phone]

## 2022-01-16 NOTE — ED Notes (Signed)
Patient discharged at this time. Ambulated to lobby with independent and steady gait. Breathing unlabored speaking in full sentences. Verbalized understanding of all discharge, follow up, teaching. Discharged homed with all belongings.

## 2022-01-16 NOTE — ED Triage Notes (Signed)
Pt arrived via [facility] from home with reports of chest pain, woke from sleep, describes squeezing, tightness, substernal non-radiating, 10/10 pain, shortness of breath, resolved on EMS arrival.  No cardiac hx, on meds for thyroid and elevated cholesterol  125/76 98% P-71 Cbg 158

## 2022-01-24 ENCOUNTER — Ambulatory Visit: Payer: 59 | Admitting: Family Medicine

## 2022-02-14 ENCOUNTER — Ambulatory Visit
Admission: RE | Admit: 2022-02-14 | Discharge: 2022-02-14 | Disposition: A | Discharge status: Home / Self Care | Payer: 59 | Source: Ambulatory Visit | Attending: Oncology | Admitting: Oncology

## 2022-02-14 DIAGNOSIS — R937 Abnormal findings on diagnostic imaging of other parts of musculoskeletal system: Secondary | ICD-10-CM | POA: Diagnosis present

## 2022-02-14 DIAGNOSIS — K769 Liver disease, unspecified: Secondary | ICD-10-CM

## 2022-02-14 MED ORDER — GADOBUTROL 1 MMOL/ML IV SOLN
7.5000 mL | Freq: Once | INTRAVENOUS | Status: AC | PRN
Start: 2022-02-14 — End: 2022-02-14
  Administered 2022-02-14: 7.5 mL via INTRAVENOUS

## 2022-02-15 ENCOUNTER — Encounter: Payer: Self-pay | Admitting: Oncology

## 2022-02-15 ENCOUNTER — Inpatient Hospital Stay: Payer: 59 | Attending: Oncology | Admitting: Oncology

## 2022-02-15 VITALS — BP 97/60 | HR 65 | Temp 96.7°F | Resp 18 | Wt 171.1 lb

## 2022-02-15 DIAGNOSIS — D134 Benign neoplasm of liver: Secondary | ICD-10-CM

## 2022-02-15 DIAGNOSIS — Z79899 Other long term (current) drug therapy: Secondary | ICD-10-CM | POA: Diagnosis not present

## 2022-02-15 DIAGNOSIS — D1803 Hemangioma of intra-abdominal structures: Secondary | ICD-10-CM

## 2022-02-15 DIAGNOSIS — E039 Hypothyroidism, unspecified: Secondary | ICD-10-CM | POA: Insufficient documentation

## 2022-02-15 DIAGNOSIS — K769 Liver disease, unspecified: Secondary | ICD-10-CM | POA: Diagnosis present

## 2022-02-15 DIAGNOSIS — Z85820 Personal history of malignant melanoma of skin: Secondary | ICD-10-CM | POA: Insufficient documentation

## 2022-02-23 ENCOUNTER — Other Ambulatory Visit: Payer: Self-pay | Admitting: Family Medicine

## 2022-02-23 DIAGNOSIS — E782 Mixed hyperlipidemia: Secondary | ICD-10-CM

## 2022-02-25 ENCOUNTER — Telehealth: Payer: Self-pay

## 2022-02-25 NOTE — Telephone Encounter (Signed)
Voicemail left with Karen Franco to return call regarding follow up.

## 2022-02-25 NOTE — Telephone Encounter (Signed)
Requested Prescriptions  Pending Prescriptions Disp Refills  . ezetimibe (ZETIA) 10 MG tablet [Pharmacy Med Name: EZETIMIBE '10MG'$  TABLETS] 90 tablet 1    Sig: TAKE 1 TABLET(10 MG) BY MOUTH DAILY     Cardiovascular:  Antilipid - Sterol Transport Inhibitors Failed - 02/23/2022  6:18 AM      Failed - AST in normal range and within 360 days    AST  Date Value Ref Range Status  01/16/2022 42 (H) 15 - 41 U/L Final         Failed - Lipid Panel in normal range within the last 12 months    Cholesterol, Total  Date Value Ref Range Status  08/08/2021 195 100 - 199 mg/dL Final   LDL Chol Calc (NIH)  Date Value Ref Range Status  08/08/2021 119 (H) 0 - 99 mg/dL Final   HDL  Date Value Ref Range Status  08/08/2021 62 >39 mg/dL Final   Triglycerides  Date Value Ref Range Status  08/08/2021 78 0 - 149 mg/dL Final         Passed - ALT in normal range and within 360 days    ALT  Date Value Ref Range Status  01/16/2022 34 0 - 44 U/L Final         Passed - Patient is not pregnant      Passed - Valid encounter within last 12 months    Recent Outpatient Visits          2 months ago [facility] [place], Deanna C, MD   5 months ago Acquired hypothyroidism   [facility] [facility] [place], [place], MD   7 months ago Annual physical exam   [place] [facility] [place], Deanna C, MD   1 year ago Mixed hyperlipidemia    [facility] [place], Deanna C, MD   1 year ago Acquired hypothyroidism    [facility] [place], [place], MD      Future Appointments            In 2 weeks Agbor-Etang, Aaron Edelman, MD [facility] [place]. [place]   In 1 month Juline Patch, MD [facility] at [facility], [facility]

## 2022-02-26 ENCOUNTER — Telehealth: Payer: Self-pay

## 2022-02-26 NOTE — Progress Notes (Signed)
Hematology/Oncology Consult note [facility]  [phone] [phone]  Patient Care Team: Juline Patch, MD as PCP - General (Family Medicine)   Name of the patient: Karen Franco  [id number]  May 19, 1960   Date of visit: 02/26/22  Diagnosis-hepatic lesion possibly a hemangioma  Chief complaint/ Reason for visit-discussed MRI results and further management  Heme/Onc history: Patient is a 61 year old female transferring her care from Dr. Mike Gip.  She was seen her for a subcapsular mass in the right lobe of the liver which has been followed in the past.She has been getting yearly MRI of the abdomen.  This small lesion in the right lower lobe of the liver was consistent with a hepatic adenoma.  She does not have any baseline cirrhosis.   Patient has a prior history of melanoma which was resected.  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 Purvis Kilts was being followed as an outpatient her recent MRI in May 2022 showed subtle enlargement of this mass which was about 2 mm larger and associated with some mild mass-effect on the spinal cord.  She underwent resection of the same and was found to have an atypical meningioma.  This was followed by adjuvant radiation therapy  Interval history-patient is doing well presently and denies any specific complaints at this time  ECOG PS- 0 Pain scale- 0   Review of systems- Review of Systems  Constitutional:  Negative for chills, fever, malaise/fatigue and weight loss.  HENT:  Negative for congestion, ear discharge and nosebleeds.   Eyes:  Negative for blurred vision.  Respiratory:  Negative for cough, hemoptysis, sputum production, shortness of breath and wheezing.   Cardiovascular:  Negative for chest pain, palpitations, orthopnea and claudication.  Gastrointestinal:  Negative for abdominal pain, blood in stool, constipation, diarrhea, heartburn, melena,  nausea and vomiting.  Genitourinary:  Negative for dysuria, flank pain, frequency, hematuria and urgency.  Musculoskeletal:  Negative for back pain, joint pain and myalgias.  Skin:  Negative for rash.  Neurological:  Negative for dizziness, tingling, focal weakness, seizures, weakness and headaches.  Endo/Heme/Allergies:  Does not bruise/bleed easily.  Psychiatric/Behavioral:  Negative for depression and suicidal ideas. The patient does not have insomnia.       No Known Allergies   Past Medical History:  Diagnosis Date   Cancer ([place])    melanoma- on left ear   Dysrhythmia    palpatations if thyroid meds are "off"   Hypothyroidism    Motion sickness    cars - back seat   Seizures ([place])    22 yrs ago - controlled on Depakote   Thyroid disease      Past Surgical History:  Procedure Laterality Date   APPENDECTOMY     BREAST CYST ASPIRATION     breast cyst/ not sure which side-neg   CESAREAN SECTION     x 2   CHOLECYSTECTOMY N/A 03/01/2019   Procedure: LAPAROSCOPIC CHOLECYSTECTOMY;  Surgeon: Fredirick Maudlin, MD;  Location: [facility];  Service: General;  Laterality: N/A;   COLONOSCOPY WITH PROPOFOL N/A 01/20/2015   Procedure: COLONOSCOPY WITH PROPOFOL;  Surgeon: Lucilla Lame, MD;  Location: [place];  Service: Endoscopy;  Laterality: N/A;   COLONOSCOPY WITH PROPOFOL N/A 03/17/2020   Procedure: COLONOSCOPY WITH PROPOFOL;  Surgeon: Lucilla Lame, MD;  Location: [place];  Service: Endoscopy;  Laterality: N/A;  priority 4   MOHS SURGERY  05/2015   L)  ear   POLYPECTOMY  01/20/2015   Procedure: POLYPECTOMY;  Surgeon: Lucilla Lame, MD;  Location: [place];  Service: Endoscopy;;   POLYPECTOMY  03/17/2020   Procedure: POLYPECTOMY;  Surgeon: Lucilla Lame, MD;  Location: [place];  Service: Endoscopy;;   THYROIDECTOMY     VAGINAL HYSTERECTOMY      Social History   Socioeconomic History   Marital status: Married    Spouse name: Not on file    Number of children: Not on file   Years of education: Not on file   Highest education level: Not on file  Occupational History   Not on file  Tobacco Use   Smoking status: Never   Smokeless tobacco: Never  Vaping Use   Vaping Use: Never used  Substance and Sexual Activity   Alcohol use: Yes    Alcohol/week: 0.0 standard drinks of alcohol    Comment: 2 glass wine/month   Drug use: No   Sexual activity: Yes  Other Topics Concern   Not on file  Social History Narrative   Not on file   Social Determinants of Health   Financial Resource Strain: Not on file  Food Insecurity: Not on file  Transportation Needs: Not on file  Physical Activity: Not on file  Stress: Not on file  Social Connections: Not on file  Intimate Partner Violence: Not on file    Family History  Problem Relation Age of Onset   Cancer Mother    Heart disease Father    Diabetes Maternal Grandfather    Breast cancer Paternal Aunt      Current Outpatient Medications:    Calcium Carbonate-Vitamin D (CALCIUM-VITAMIN D) 500-200 MG-UNIT per tablet, Take 1 tablet by mouth daily., Disp: , Rfl:    cholecalciferol (VITAMIN D) 1000 units tablet, Take 1,000 Units by mouth daily., Disp: , Rfl:    levothyroxine (SYNTHROID) 100 MCG tablet, Take 1 tablet (100 mcg total) by mouth daily. Take with 36mg=188mcg, Disp: 30 tablet, Rfl: 5   levothyroxine (SYNTHROID) 88 MCG tablet, Take 1 tablet (88 mcg total) by mouth daily. Take with 1020m=188mcg daily, Disp: 30 tablet, Rfl: 5   Multiple Vitamin (MULTIVITAMIN) capsule, Take 1 capsule by mouth daily., Disp: , Rfl:    vitamin B-12 (CYANOCOBALAMIN) 1000 MCG tablet, Take 1,000 mcg by mouth daily., Disp: , Rfl:    ezetimibe (ZETIA) 10 MG tablet, TAKE 1 TABLET(10 MG) BY MOUTH DAILY, Disp: 90 tablet, Rfl: 1  Physical exam:  Vitals:   02/15/22 1116  BP: 97/60  Pulse: 65  Resp: 18  Temp: (!) 96.7 F (35.9 C)  SpO2: 100%  Weight: 171 lb 1.6 oz (77.6 kg)   Physical  Exam Constitutional:      General: She is not in acute distress. Cardiovascular:     Rate and Rhythm: Normal rate and regular rhythm.     Heart sounds: Normal heart sounds.  Pulmonary:     Effort: Pulmonary effort is normal.     Breath sounds: Normal breath sounds.  Abdominal:     General: Bowel sounds are normal.     Palpations: Abdomen is soft.  Skin:    General: Skin is warm and dry.  Neurological:     Mental Status: She is alert and oriented to person, place, and time.         Latest Ref Rng & Units 01/16/2022    2:26 AM  CMP  Glucose 70 - 99 mg/dL 93   BUN 6 - 20 mg/dL  32   Creatinine 0.44 - 1.00 mg/dL 0.62   Sodium 135 - 145 mmol/L 140   Potassium 3.5 - 5.1 mmol/L 3.8   Chloride 98 - 111 mmol/L 109   CO2 22 - 32 mmol/L 23   Calcium 8.9 - 10.3 mg/dL 9.2   Total Protein 6.5 - 8.1 g/dL 6.6   Total Bilirubin 0.3 - 1.2 mg/dL 0.5   Alkaline Phos 38 - 126 U/L 77   AST 15 - 41 U/L 42   ALT 0 - 44 U/L 34       Latest Ref Rng & Units 01/16/2022    2:26 AM  CBC  WBC 4.0 - 10.5 K/uL 7.0   Hemoglobin 12.0 - 15.0 g/dL 13.8   Hematocrit 36.0 - 46.0 % 42.1   Platelets 150 - 400 K/uL 335     No images are attached to the encounter.  MR Abdomen W Wo Contrast  Result Date: 02/15/2022 CLINICAL DATA:  Follow-up indeterminate right hepatic lobe lesion. Personal history of melanoma. EXAM: MRI ABDOMEN WITHOUT AND WITH CONTRAST TECHNIQUE: Multiplanar multisequence MR imaging of the abdomen was performed both before and after the administration of intravenous contrast. CONTRAST:  7.109m GADAVIST GADOBUTROL 1 MMOL/ML IV SOLN COMPARISON:  02/12/2021 and earlier studies dating back to 04/23/2019 FINDINGS: Lower chest: No acute findings. Hepatobiliary: 3.5 cm benign hemangioma in the posterior right hepatic lobe and 1.6 cm benign hemangioma in the left hepatic lobe remain stable. A 7 mm focus of arterial phase hyperenhancement is again seen in segment 7 on image 30/13 which is nearly isointense  with liver on later sequences and shows no significant change compared to prior exams. This most likely represents tiny benign focal nodular hyperplasia or adenoma. New or enlarging liver lesions identified. Prior cholecystectomy. No evidence of biliary obstruction. Pancreas:  No mass or inflammatory changes. Spleen:  Within normal limits in size and appearance. Adrenals/Urinary Tract: No suspicious masses identified. No evidence of hydronephrosis. Stomach/Bowel: Unremarkable. Vascular/Lymphatic: No pathologically enlarged lymph nodes identified. No acute vascular findings. Other:  None. Musculoskeletal:  No suspicious bone lesions identified. IMPRESSION: Continued stability of 7 mm focus of arterial phase hyperenhancement in segment 7 of the liver, which most likely represents benign focal nodular hyperplasia or adenoma. Stable benign hepatic hemangiomas. No evidence of abdominal metastatic disease or other acute findings. Electronically Signed   By: JMarlaine HindM.D.   On: 02/15/2022 15:03     Assessment and plan- Patient is a 61y.o. female here to discuss MRI results and further management  I have reviewed MRI abdomen images independently and discussed findings with the patient.  I have also discussed her case with Dr. WEarleen Newportfrom interventional radiology who also reached out to Dr. SKris Hartmannwho has read her most recent MRI report.  There is a 7 mm hyperenhancing focus in the segment 7 of the liver which has remained stable over the last 2 to 3 years and consistent with an adenoma or benign focal nodular hyperplasia.  She also has a 3.5 cm benign hemangioma in the right posterior hepatic lobe and a 1.6 cm hemangioma in the left hepatic lobe which has remained stable.  Given the overall stability in her findings this does not require any further follow-up with MRI.  She therefore does not require any follow-up with me at this time.   Visit Diagnosis 1. Hemangioma of liver   2. Hepatic adenoma      Dr.  ARanda Evens MD, MPH CMarc[facility]at [facility]  [facility] [phone] 02/26/2022 12:52 PM

## 2022-02-26 NOTE — Telephone Encounter (Signed)
Spoke with Karen Franco and she was notified of information provided in MRI result note from Dr. Janese Banks. No questions at this time.

## 2022-03-11 ENCOUNTER — Ambulatory Visit: Payer: 59 | Attending: Cardiology | Admitting: Cardiology

## 2022-03-11 ENCOUNTER — Other Ambulatory Visit
Admission: RE | Admit: 2022-03-11 | Discharge: 2022-03-11 | Disposition: A | Discharge status: Home / Self Care | Payer: 59 | Source: Ambulatory Visit | Attending: Cardiology | Admitting: Cardiology

## 2022-03-11 ENCOUNTER — Encounter: Payer: Self-pay | Admitting: Cardiology

## 2022-03-11 VITALS — BP 101/69 | HR 70 | Ht 67.5 in | Wt 167.4 lb

## 2022-03-11 DIAGNOSIS — R072 Precordial pain: Secondary | ICD-10-CM | POA: Diagnosis not present

## 2022-03-11 DIAGNOSIS — E7800 Pure hypercholesterolemia, unspecified: Secondary | ICD-10-CM | POA: Diagnosis not present

## 2022-03-11 DIAGNOSIS — I498 Other specified cardiac arrhythmias: Secondary | ICD-10-CM

## 2022-03-11 LAB — BASIC METABOLIC PANEL
Anion gap: 9 (ref 5–15)
BUN: 23 mg/dL — ABNORMAL HIGH (ref 6–20)
CO2: 27 mmol/L (ref 22–32)
Calcium: 9.6 mg/dL (ref 8.9–10.3)
Chloride: 104 mmol/L (ref 98–111)
Creatinine, Ser: 0.75 mg/dL (ref 0.44–1.00)
GFR, Estimated: >60 mL/min (ref >60)
Glucose, Bld: 90 mg/dL (ref 70–99)
Potassium: 4.2 mmol/L (ref 3.5–5.1)
Sodium: 140 mmol/L (ref 135–145)

## 2022-03-11 MED ORDER — IVABRADINE HCL 5 MG PO TABS: 15.0000 mg | ORAL_TABLET | Freq: Once | ORAL | 0 refills | Status: AC

## 2022-03-11 NOTE — Patient Instructions (Signed)
Medication Instructions:   Your physician recommends that you continue on your current medications as directed. Please refer to the Current Medication list given to you today.  *If you need a refill on your cardiac medications before your next appointment, please call your pharmacy*   Lab Work:  Please go to the [facility] after your appointment today for a BMP lab draw.   Testing/Procedures:  Your physician has requested that you have an echocardiogram. Echocardiography is a painless test that uses sound waves to create images of your heart. It provides your doctor with information about the size and shape of your heart and how well your heart's chambers and valves are working. This procedure takes approximately one hour. There are no restrictions for this procedure. Please do NOT wear cologne, perfume, aftershave, or lotions (deodorant is allowed). Please arrive 15 minutes prior to your appointment time.   2.    Your physician has requested that you have cardiac CT. Cardiac computed tomography (CT) is a painless test that uses an x-ray machine to take clear, detailed pictures of your heart.    Your cardiac CT will be scheduled at:  [address], [place] [address] [phone]  Monday 03/25/22  at 8:45 AM  Please arrive 15 mins early for check-in and test prep.    Please follow these instructions carefully (unless otherwise directed):   Night Before the Test: Be sure to Drink plenty of water. Do not consume any caffeinated/decaffeinated beverages or chocolate 12 hours prior to your test.   On the Day of the Test: Drink plenty of water until 1 hour prior to the test. Do not eat any food 4 hours prior to the test. You may take your regular medications prior to the test.  Take Ivabradine (Corlanor) 15 MG two hours prior to test. FEMALES- please wear underwire-free bra if available, avoid dresses & tight  clothing   After the Test: Drink plenty of water. After receiving IV contrast, you may experience a mild flushed feeling. This is normal. On occasion, you may experience a mild rash up to 24 hours after the test. This is not dangerous. If this occurs, you can take Benadryl 25 mg and increase your fluid intake. If you experience trouble breathing, this can be serious. If it is severe call 911 IMMEDIATELY. If it is mild, please call our office. If you take any of these medications: Glipizide/Metformin, Avandament, Glucavance, please do not take 48 hours after completing test unless otherwise instructed.  Please allow 2-4 weeks for scheduling of routine cardiac CTs. Some insurance companies require a pre-authorization which may delay scheduling of this test.   For non-scheduling related questions, please contact the cardiac imaging nurse navigator should you have any questions/concerns: Marchia Bond, Cardiac Imaging Nurse Navigator Gordy Clement, Cardiac Imaging Nurse Navigator [facility] Direct Office Dial: [phone]   For scheduling needs, including cancellations and rescheduling, please call [place], [phone].     Follow-Up: At [facility], you and your health needs are our priority.  As part of our continuing mission to provide you with exceptional heart care, we have created designated Provider Care Teams.  These Care Teams include your primary Cardiologist (physician) and Advanced Practice Providers (APPs -  Physician Assistants and Nurse Practitioners) who all work together to provide you with the care you need, when you need it.  We recommend signing up for the patient portal called "MyChart".  Sign up information is provided on this After Visit Summary.  MyChart is used to connect with patients for Virtual Visits (Telemedicine).  Patients are able to view lab/test results, encounter notes, upcoming appointments, etc.  Non-urgent messages  can be sent to your provider as well.   To learn more about what you can do with MyChart, go to [email or web address].    Your next appointment:   Follow up after testing   The format for your next appointment:   In Person  Provider:   You may see Kate Sable, MD or one of the following Advanced Practice Providers on your designated Care Team:   Murray Hodgkins, NP Christell Faith, PA-C Cadence Kathlen Mody, PA-C Gerrie Nordmann, NP    Other Instructions   Important Information About Sugar

## 2022-03-11 NOTE — Progress Notes (Signed)
Cardiology Office Note:    Date:  03/11/2022   ID:  Karen Franco, Karen Franco June 29, 1960, MRN [id number]  PCP:  Juline Patch, MD   [place] Providers Cardiologist:  None     Referring MD: Merlyn Lot, MD   Chief Complaint  Patient presents with   New Patient (Initial Visit)    Chest pain, dizziness happens in am, Family Hx   Karen Franco is a 61 y.o. female who is being seen today for the evaluation of chest pain at the request of Merlyn Lot, MD.   History of Present Illness:    Karen Franco is a 61 y.o. female with a hx of hyperlipidemia, hypothyroidism who presents with chest pain.  Patient had symptoms of chest pain last month while asleep.  Pain woke her up from sleep, denies having any prior symptoms.  States having history of reflux but this resolved after having her gallbladder removed.  Symptoms associated with some diaphoresis prompting her to call EMS.  Taken to the ED where work-up was unrevealing, blood pressure noted to be low.  Has occasional dizziness when she gets up too quickly, has been sitting up on her bed prior to getting up with improvement in symptoms.  Her father had a heart attack in his 66s.  She takes Zetia for hyperlipidemia, has been eating healthy, lost some weight, hoping her cholesterol numbers are improved so she can come off current cholesterol medication.  Has rare heart flutters lasting a few seconds, occurring once every 2 months.  Past Medical History:  Diagnosis Date   Cancer ([place])    melanoma- on left ear   Dysrhythmia    palpatations if thyroid meds are "off"   Hypothyroidism    Motion sickness    cars - back seat   Seizures ([place])    22 yrs ago - controlled on Depakote   Thyroid disease     Past Surgical History:  Procedure Laterality Date   APPENDECTOMY     BREAST CYST ASPIRATION     breast cyst/ not sure which side-neg   CESAREAN SECTION     x 2   CHOLECYSTECTOMY N/A 03/01/2019    Procedure: LAPAROSCOPIC CHOLECYSTECTOMY;  Surgeon: Fredirick Maudlin, MD;  Location: [facility];  Service: General;  Laterality: N/A;   COLONOSCOPY WITH PROPOFOL N/A 01/20/2015   Procedure: COLONOSCOPY WITH PROPOFOL;  Surgeon: Lucilla Lame, MD;  Location: [place];  Service: Endoscopy;  Laterality: N/A;   COLONOSCOPY WITH PROPOFOL N/A 03/17/2020   Procedure: COLONOSCOPY WITH PROPOFOL;  Surgeon: Lucilla Lame, MD;  Location: [place];  Service: Endoscopy;  Laterality: N/A;  priority 4   MOHS SURGERY  05/2015   L) ear   POLYPECTOMY  01/20/2015   Procedure: POLYPECTOMY;  Surgeon: Lucilla Lame, MD;  Location: [place];  Service: Endoscopy;;   POLYPECTOMY  03/17/2020   Procedure: POLYPECTOMY;  Surgeon: Lucilla Lame, MD;  Location: [place];  Service: Endoscopy;;   THYROIDECTOMY     VAGINAL HYSTERECTOMY      Current Medications: Current Meds  Medication Sig   Calcium Carbonate-Vitamin D (CALCIUM-VITAMIN D) 500-200 MG-UNIT per tablet Take 1 tablet by mouth daily.   ezetimibe (ZETIA) 10 MG tablet TAKE 1 TABLET(10 MG) BY MOUTH DAILY   ivabradine (CORLANOR) 5 MG TABS tablet Take 3 tablets (15 mg total) by mouth once for 1 dose. Take 2 hours prior to your CT Scan   levothyroxine (SYNTHROID) 100 MCG tablet Take 1 tablet (  100 mcg total) by mouth daily. Take with 27mg=188mcg   levothyroxine (SYNTHROID) 88 MCG tablet Take 1 tablet (88 mcg total) by mouth daily. Take with 1048m=188mcg daily   Multiple Vitamin (MULTIVITAMIN) capsule Take 1 capsule by mouth daily.   vitamin B-12 (CYANOCOBALAMIN) 1000 MCG tablet Take 1,000 mcg by mouth daily.     Allergies:   Patient has no known allergies.   Social History   Socioeconomic History   Marital status: Married    Spouse name: Not on file   Number of children: Not on file   Years of education: Not on file   Highest education level: Not on file  Occupational History   Not on file  Tobacco Use   Smoking status: Never    Smokeless tobacco: Never  Vaping Use   Vaping Use: Never used  Substance and Sexual Activity   Alcohol use: Not Currently    Comment: 2 glass wine/month   Drug use: No   Sexual activity: Yes  Other Topics Concern   Not on file  Social History Narrative   Not on file   Social Determinants of Health   Financial Resource Strain: Not on file  Food Insecurity: Not on file  Transportation Needs: Not on file  Physical Activity: Not on file  Stress: Not on file  Social Connections: Not on file     Family History: The patient's family history includes Breast cancer in her paternal aunt; Cancer in her mother; Diabetes in her maternal grandfather; Heart disease in her father.  ROS:   Please see the history of present illness.     All other systems reviewed and are negative.  EKGs/Labs/Other Studies Reviewed:    The following studies were reviewed today:   EKG:  EKG is  ordered today.  The ekg ordered today demonstrates normal sinus rhythm, heart rate 70  Recent Labs: 09/24/2021: TSH 0.074 01/16/2022: ALT 34; BUN 32; Creatinine, Ser 0.62; Hemoglobin 13.8; Platelets 335; Potassium 3.8; Sodium 140  Recent Lipid Panel    Component Value Date/Time   CHOL 195 08/08/2021 0805   TRIG 78 08/08/2021 0805   HDL 62 08/08/2021 0805   CHOLHDL 3.2 10/07/2017 0949   LDLCALC 119 (H) 08/08/2021 0805     Risk Assessment/Calculations:             Physical Exam:    VS:  BP 101/69 (BP Location: Right Arm, Patient Position: Sitting)   Pulse 70   Ht 5' 7.5" (1.715 m)   Wt 167 lb 6.4 oz (75.9 kg)   SpO2 99%   BMI 25.83 kg/m     Wt Readings from Last 3 Encounters:  03/11/22 167 lb 6.4 oz (75.9 kg)  02/15/22 171 lb 1.6 oz (77.6 kg)  01/16/22 179 lb (81.2 kg)     GEN:  Well nourished, well developed in no acute distress HEENT: Normal NECK: No JVD; No carotid bruits CARDIAC: RRR, no murmurs, rubs, gallops RESPIRATORY:  Clear to auscultation without rales, wheezing or rhonchi   ABDOMEN: Soft, non-tender, non-distended MUSCULOSKELETAL:  No edema; No deformity  SKIN: Warm and dry NEUROLOGIC:  Alert and oriented x 3 PSYCHIATRIC:  Normal affect   ASSESSMENT:    1. Precordial pain   2. Pure hypercholesterolemia   3. Fluttering heart    PLAN:    In order of problems listed above:  Chest pain, risk factors hyperlipidemia, family history of early CAD.  Get echo, get coronary CTA. Hyperlipidemia, continue Zetia.  Continue low-cholesterol diet. Rare  heart flutters once every 2 months.  Unsure if thyroid dysfunction is contributing.  If symptoms become more frequent, consider cardiac monitor.  Follow-up after echo and coronary CTA      Medication Adjustments/Labs and Tests Ordered: Current medicines are reviewed at length with the patient today.  Concerns regarding medicines are outlined above.  Orders Placed This Encounter  Procedures   CT CORONARY MORPH W/CTA COR W/SCORE W/CA W/CM &/OR WO/CM   Basic metabolic panel   EKG 81-OFBP   ECHOCARDIOGRAM COMPLETE   Meds ordered this encounter  Medications   ivabradine (CORLANOR) 5 MG TABS tablet    Sig: Take 3 tablets (15 mg total) by mouth once for 1 dose. Take 2 hours prior to your CT Scan    Dispense:  3 tablet    Refill:  0    Patient Instructions  Medication Instructions:   Your physician recommends that you continue on your current medications as directed. Please refer to the Current Medication list given to you today.  *If you need a refill on your cardiac medications before your next appointment, please call your pharmacy*   Lab Work:  Please go to the [facility] after your appointment today for a BMP lab draw.   Testing/Procedures:  Your physician has requested that you have an echocardiogram. Echocardiography is a painless test that uses sound waves to create images of your heart. It provides your doctor with information about the size and shape of your heart and how well your heart's  chambers and valves are working. This procedure takes approximately one hour. There are no restrictions for this procedure. Please do NOT wear cologne, perfume, aftershave, or lotions (deodorant is allowed). Please arrive 15 minutes prior to your appointment time.   2.    Your physician has requested that you have cardiac CT. Cardiac computed tomography (CT) is a painless test that uses an x-ray machine to take clear, detailed pictures of your heart.    Your cardiac CT will be scheduled at:  [facility] [place], [place] [address] [phone]  Monday 03/25/22  at 8:45 AM  Please arrive 15 mins early for check-in and test prep.    Please follow these instructions carefully (unless otherwise directed):   Night Before the Test: Be sure to Drink plenty of water. Do not consume any caffeinated/decaffeinated beverages or chocolate 12 hours prior to your test.   On the Day of the Test: Drink plenty of water until 1 hour prior to the test. Do not eat any food 4 hours prior to the test. You may take your regular medications prior to the test.  Take Ivabradine (Corlanor) 15 MG two hours prior to test. FEMALES- please wear underwire-free bra if available, avoid dresses & tight clothing   After the Test: Drink plenty of water. After receiving IV contrast, you may experience a mild flushed feeling. This is normal. On occasion, you may experience a mild rash up to 24 hours after the test. This is not dangerous. If this occurs, you can take Benadryl 25 mg and increase your fluid intake. If you experience trouble breathing, this can be serious. If it is severe call 911 IMMEDIATELY. If it is mild, please call our office. If you take any of these medications: Glipizide/Metformin, Avandament, Glucavance, please do not take 48 hours after completing test unless otherwise instructed.  Please allow 2-4 weeks for scheduling of routine  cardiac CTs. Some insurance companies require  a pre-authorization which may delay scheduling of this test.   For non-scheduling related questions, please contact the cardiac imaging nurse navigator should you have any questions/concerns: Marchia Bond, Cardiac Imaging Nurse Navigator Gordy Clement, Cardiac Imaging Nurse Navigator [facility] Direct Office Dial: [phone]   For scheduling needs, including cancellations and rescheduling, please call [place], [phone].     Follow-Up: At [facility], you and your health needs are our priority.  As part of our continuing mission to provide you with exceptional heart care, we have created designated Provider Care Teams.  These Care Teams include your primary Cardiologist (physician) and Advanced Practice Providers (APPs -  Physician Assistants and Nurse Practitioners) who all work together to provide you with the care you need, when you need it.  We recommend signing up for the patient portal called "MyChart".  Sign up information is provided on this After Visit Summary.  MyChart is used to connect with patients for Virtual Visits (Telemedicine).  Patients are able to view lab/test results, encounter notes, upcoming appointments, etc.  Non-urgent messages can be sent to your provider as well.   To learn more about what you can do with MyChart, go to [email or web address].    Your next appointment:   Follow up after testing   The format for your next appointment:   In Person  Provider:   You may see Kate Sable, MD or one of the following Advanced Practice Providers on your designated Care Team:   Murray Hodgkins, NP Christell Faith, PA-C Cadence Kathlen Mody, PA-C Gerrie Nordmann, NP    Other Instructions   Important Information About Sugar          Signed, Kate Sable, MD  03/11/2022 3:23 PM    [place]

## 2022-03-21 ENCOUNTER — Telehealth (HOSPITAL_COMMUNITY): Payer: Self-pay | Admitting: *Deleted

## 2022-03-21 NOTE — Telephone Encounter (Signed)
Attempted to call patient regarding upcoming cardiac CT appointment. °Left message on voicemail with name and callback number ° °Camilah Spillman RN Navigator Cardiac Imaging °[facility] °[phone] Office °[phone] Cell ° °

## 2022-03-25 ENCOUNTER — Ambulatory Visit
Admission: RE | Admit: 2022-03-25 | Discharge: 2022-03-25 | Disposition: A | Discharge status: Home / Self Care | Payer: 59 | Source: Ambulatory Visit | Attending: Cardiology | Admitting: Cardiology

## 2022-03-25 DIAGNOSIS — R072 Precordial pain: Secondary | ICD-10-CM | POA: Diagnosis present

## 2022-03-25 MED ORDER — NITROGLYCERIN 0.4 MG SL SUBL
0.8000 mg | SUBLINGUAL_TABLET | Freq: Once | SUBLINGUAL | Status: AC
  Administered 2022-03-25: 0.8 mg via SUBLINGUAL

## 2022-03-25 MED ORDER — IOHEXOL 350 MG/ML SOLN
100.0000 mL | Freq: Once | INTRAVENOUS | Status: AC | PRN
  Administered 2022-03-25: 100 mL via INTRAVENOUS

## 2022-03-25 MED ORDER — IOHEXOL 300 MG/ML  SOLN: 100.0000 mL | Freq: Once | INTRAMUSCULAR | Status: DC | PRN

## 2022-03-25 NOTE — Progress Notes (Signed)
Patient tolerated procedure well. Ambulate w/o difficulty. Denies any lightheadedness or being dizzy. Pt denies any pain at this time. Sitting in chair, no complaints voiced.  Pt is encouraged to drink additional water throughout the day and reason explained to patient. Patient verbalized understanding and all questions answered. ABC intact. No further needs at this time. Discharge from procedure area w/o issues.

## 2022-03-27 ENCOUNTER — Encounter: Payer: Self-pay | Admitting: Family Medicine

## 2022-03-27 ENCOUNTER — Ambulatory Visit (INDEPENDENT_AMBULATORY_CARE_PROVIDER_SITE_OTHER): Payer: 59 | Admitting: Family Medicine

## 2022-03-27 DIAGNOSIS — E782 Mixed hyperlipidemia: Secondary | ICD-10-CM

## 2022-03-27 DIAGNOSIS — E039 Hypothyroidism, unspecified: Secondary | ICD-10-CM | POA: Diagnosis not present

## 2022-03-27 MED ORDER — LEVOTHYROXINE SODIUM 88 MCG PO TABS: 88.0000 ug | ORAL_TABLET | Freq: Every day | ORAL | 5 refills | Status: DC

## 2022-03-27 MED ORDER — LEVOTHYROXINE SODIUM 100 MCG PO TABS: 100.0000 ug | ORAL_TABLET | Freq: Every day | ORAL | 5 refills | Status: DC

## 2022-03-27 NOTE — Progress Notes (Signed)
Date:  03/27/2022   Name:  Karen Franco   DOB:  Mar 22, 1961   MRN:  [id number]   Chief Complaint: Hyperlipidemia, Hypothyroidism, and Flu Vaccine  Hyperlipidemia This is a chronic problem. The current episode started more than 1 year ago. The problem is controlled. Recent lipid tests were reviewed and are normal. She has no history of chronic renal disease, diabetes, hypothyroidism, liver disease, obesity or nephrotic syndrome. Pertinent negatives include no myalgias or shortness of breath. Current antihyperlipidemic treatment includes diet change (stop zetia). The current treatment provides mild improvement of lipids.  Thyroid Problem Presents for follow-up visit. Symptoms include cold intolerance and weight loss. Patient reports no anxiety, constipation, diaphoresis, diarrhea, dry skin, fatigue, hair loss, heat intolerance, hoarse voice, leg swelling, menstrual problem, nail problem, palpitations, tremors, visual change or weight gain. The symptoms have been stable. Her past medical history is significant for hyperlipidemia. There is no history of diabetes.    Lab Results  Component Value Date   NA 140 03/11/2022   K 4.2 03/11/2022   CO2 27 03/11/2022   GLUCOSE 90 03/11/2022   BUN 23 (H) 03/11/2022   CREATININE 0.75 03/11/2022   CALCIUM 9.6 03/11/2022   EGFR 97 08/08/2021   GFRNONAA >60 03/11/2022   Lab Results  Component Value Date   CHOL 195 08/08/2021   HDL 62 08/08/2021   LDLCALC 119 (H) 08/08/2021   TRIG 78 08/08/2021   CHOLHDL 3.2 10/07/2017   Lab Results  Component Value Date   TSH 0.074 (L) 09/24/2021   Lab Results  Component Value Date   HGBA1C 5.2 08/08/2021   Lab Results  Component Value Date   WBC 7.0 01/16/2022   HGB 13.8 01/16/2022   HCT 42.1 01/16/2022   MCV 87.2 01/16/2022   PLT 335 01/16/2022   Lab Results  Component Value Date   ALT 34 01/16/2022   AST 42 (H) 01/16/2022   ALKPHOS 77 01/16/2022   BILITOT 0.5 01/16/2022   No results  found for: "25OHVITD2", "25OHVITD3", "VD25OH"   Review of Systems  Constitutional:  Positive for weight loss. Negative for chills, diaphoresis, fatigue, fever, unexpected weight change and weight gain.  HENT:  Negative for congestion, ear discharge, ear pain, hoarse voice, rhinorrhea, sinus pressure, sneezing and sore throat.   Respiratory:  Negative for cough, shortness of breath, wheezing and stridor.   Cardiovascular:  Negative for palpitations.  Gastrointestinal:  Negative for abdominal pain, blood in stool, constipation, diarrhea and nausea.  Endocrine: Positive for cold intolerance. Negative for heat intolerance.  Genitourinary:  Negative for dysuria, flank pain, frequency, hematuria, menstrual problem, urgency and vaginal discharge.  Musculoskeletal:  Negative for arthralgias, back pain and myalgias.  Skin:  Negative for rash.  Neurological:  Negative for dizziness, tremors, weakness and headaches.  Hematological:  Negative for adenopathy. Does not bruise/bleed easily.  Psychiatric/Behavioral:  Negative for dysphoric mood. The patient is not nervous/anxious.     Patient Active Problem List   Diagnosis Date Noted   H/O total hysterectomy 07/17/2020   Polyp of sigmoid colon    History of colonic polyps 02/14/2020   Liver lesion, right lobe 05/06/2019   Abnormal MRI, thoracic spine 05/06/2019   Melanoma in situ of pinna of left ear ([place]) 05/06/2019   S/P laparoscopic cholecystectomy 03/02/2019   Acute cholecystitis 03/01/2019   Special screening for malignant neoplasms, colon    Benign neoplasm of ascending colon    Acquired hypothyroidism 12/07/2014   Taking medication for chronic disease  12/07/2014    No Known Allergies  Past Surgical History:  Procedure Laterality Date   APPENDECTOMY     BREAST CYST ASPIRATION     breast cyst/ not sure which side-neg   CESAREAN SECTION     x 2   CHOLECYSTECTOMY N/A 03/01/2019   Procedure: LAPAROSCOPIC CHOLECYSTECTOMY;  Surgeon:  Fredirick Maudlin, MD;  Location: [facility];  Service: General;  Laterality: N/A;   COLONOSCOPY WITH PROPOFOL N/A 01/20/2015   Procedure: COLONOSCOPY WITH PROPOFOL;  Surgeon: Lucilla Lame, MD;  Location: [facility];  Service: Endoscopy;  Laterality: N/A;   COLONOSCOPY WITH PROPOFOL N/A 03/17/2020   Procedure: COLONOSCOPY WITH PROPOFOL;  Surgeon: Lucilla Lame, MD;  Location: [place];  Service: Endoscopy;  Laterality: N/A;  priority 4   MOHS SURGERY  05/2015   L) ear   POLYPECTOMY  01/20/2015   Procedure: POLYPECTOMY;  Surgeon: Lucilla Lame, MD;  Location: [facility];  Service: Endoscopy;;   POLYPECTOMY  03/17/2020   Procedure: POLYPECTOMY;  Surgeon: Lucilla Lame, MD;  Location: [facility];  Service: Endoscopy;;   THYROIDECTOMY     VAGINAL HYSTERECTOMY      Social History   Tobacco Use   Smoking status: Never   Smokeless tobacco: Never  Vaping Use   Vaping Use: Never used  Substance Use Topics   Alcohol use: Not Currently    Comment: 2 glass wine/month   Drug use: No     Medication list has been reviewed and updated.  Current Meds  Medication Sig   Calcium Carbonate-Vitamin D (CALCIUM-VITAMIN D) 500-200 MG-UNIT per tablet Take 1 tablet by mouth daily.   cholecalciferol (VITAMIN D) 1000 units tablet Take 1,000 Units by mouth daily.   ezetimibe (ZETIA) 10 MG tablet TAKE 1 TABLET(10 MG) BY MOUTH DAILY   levothyroxine (SYNTHROID) 100 MCG tablet Take 1 tablet (100 mcg total) by mouth daily. Take with 32mg=188mcg   levothyroxine (SYNTHROID) 88 MCG tablet Take 1 tablet (88 mcg total) by mouth daily. Take with 1045m=188mcg daily   Multiple Vitamin (MULTIVITAMIN) capsule Take 1 capsule by mouth daily.   vitamin B-12 (CYANOCOBALAMIN) 1000 MCG tablet Take 1,000 mcg by mouth daily.       03/27/2022    8:59 AM 11/28/2021   11:47 AM 09/24/2021   10:28 AM 07/23/2021    9:50 AM  GAD 7 : Generalized Anxiety Score  Nervous, Anxious, on Edge 0 0 0 0   Control/stop worrying 0 0 0 0  Worry too much - different things 0 0 0 0  Trouble relaxing 0 0 0 0  Restless 0 0 0 0  Easily annoyed or irritable 0 0 0 0  Afraid - awful might happen 0 0 0 0  Total GAD 7 Score 0 0 0 0  Anxiety Difficulty Not difficult at all Not difficult at all Not difficult at all Not difficult at all       03/27/2022    8:59 AM 11/28/2021   11:46 AM 09/24/2021   10:28 AM  Depression screen PHQ 2/9  Decreased Interest 0 0 0  Down, Depressed, Hopeless 0 0 0  PHQ - 2 Score 0 0 0  Altered sleeping 0 0 0  Tired, decreased energy 0 0 0  Change in appetite 0 0 0  Feeling bad or failure about yourself  0 0 0  Trouble concentrating 0 0 0  Moving slowly or fidgety/restless 0 0 0  Suicidal thoughts 0 0 0  PHQ-9 Score 0  0 0  Difficult doing work/chores Not difficult at all Not difficult at all Not difficult at all    BP Readings from Last 3 Encounters:  03/27/22 120/76  03/25/22 (!) 114/52  03/11/22 101/69    Physical Exam Vitals and nursing note reviewed. Exam conducted with a chaperone present.  Constitutional:      General: She is not in acute distress.    Appearance: She is not diaphoretic.  HENT:     Head: Normocephalic and atraumatic.     Right Ear: Tympanic membrane and external ear normal.     Left Ear: Tympanic membrane and external ear normal.     Nose: Nose normal.     Mouth/Throat:     Mouth: Mucous membranes are moist.  Eyes:     General:        Right eye: No discharge.        Left eye: No discharge.     Conjunctiva/sclera: Conjunctivae normal.     Pupils: Pupils are equal, round, and reactive to light.  Neck:     Thyroid: No thyromegaly.     Vascular: No JVD.  Cardiovascular:     Rate and Rhythm: Normal rate and regular rhythm.     Heart sounds: Normal heart sounds. No murmur heard.    No friction rub. No gallop.  Pulmonary:     Effort: Pulmonary effort is normal.     Breath sounds: Normal breath sounds. No wheezing or rhonchi.   Abdominal:     General: Bowel sounds are normal.     Palpations: Abdomen is soft. There is no mass.     Tenderness: There is no abdominal tenderness. There is no guarding.  Musculoskeletal:        General: Normal range of motion.     Cervical back: Normal range of motion and neck supple.  Lymphadenopathy:     Cervical: No cervical adenopathy.  Skin:    General: Skin is warm and dry.  Neurological:     Mental Status: She is alert.     Cranial Nerves: Cranial nerves 2-12 are intact.     Sensory: Sensation is intact.     Motor: Motor function is intact.     Deep Tendon Reflexes: Reflexes are normal and symmetric.     Wt Readings from Last 3 Encounters:  03/27/22 166 lb (75.3 kg)  03/11/22 167 lb 6.4 oz (75.9 kg)  02/15/22 171 lb 1.6 oz (77.6 kg)    BP 120/76   Pulse 65   Ht 5' 7.5" (1.715 m)   Wt 166 lb (75.3 kg)   SpO2 99%   BMI 25.62 kg/m   Assessment and Plan:  1. Mixed hyperlipidemia Chronic.  Controlled.  Patient by diet exercise has had an 80 pound weight loss and is doing well at current BMI of 25.62.  Patient has been given a Mediterranean diet to incorporate with her low calorie diet because her husband has parkinsonism and this may help with the progression of his disease as well.  In the meantime we will discontinue Zetia reemphasized the dietary approach we will recheck in 6 weeks the lipid off the medication to see if it is adequate for diet control alone.  2. Acquired hypothyroidism .  Controlled.  Stable.  Will check TSH for current level of control in the meantime we will likely continue on current regimen of levothyroxine 188 mcg/day. - levothyroxine (SYNTHROID) 100 MCG tablet; Take 1 tablet (100 mcg total) by mouth daily. Take  with 47mg=188mcg  Dispense: 30 tablet; Refill: 5 - levothyroxine (SYNTHROID) 88 MCG tablet; Take 1 tablet (88 mcg total) by mouth daily. Take with 1079m=188mcg daily  Dispense: 30 tablet; Refill: 5 - Thyroid Panel With TSH     DeOtilio MiuMD

## 2022-03-27 NOTE — Patient Instructions (Signed)
Why follow it? Research shows. Those who follow the Mediterranean diet have a reduced risk of heart disease  The diet is associated with a reduced incidence of Parkinson's and Alzheimer's diseases People following the diet may have longer life expectancies and lower rates of chronic diseases  The Dietary Guidelines for Americans recommends the Mediterranean diet as an eating plan to promote health and prevent disease  What Is the Mediterranean Diet?  Healthy eating plan based on typical foods and recipes of Mediterranean-style cooking The diet is primarily a plant based diet; these foods should make up a majority of meals   Starches - Plant based foods should make up a majority of meals - They are an important sources of vitamins, minerals, energy, antioxidants, and fiber - Choose whole grains, foods high in fiber and minimally processed items  - Typical grain sources include wheat, oats, barley, corn, brown rice, bulgar, farro, millet, polenta, couscous  - Various types of beans include chickpeas, lentils, fava beans, black beans, white beans   Fruits  Veggies - Large quantities of antioxidant rich fruits & veggies; 6 or more servings  - Vegetables can be eaten raw or lightly drizzled with oil and cooked  - Vegetables common to the traditional Mediterranean Diet include: artichokes, arugula, beets, broccoli, brussel sprouts, cabbage, carrots, celery, collard greens, cucumbers, eggplant, kale, leeks, lemons, lettuce, mushrooms, okra, onions, peas, peppers, potatoes, pumpkin, radishes, rutabaga, shallots, spinach, sweet potatoes, turnips, zucchini - Fruits common to the Mediterranean Diet include: apples, apricots, avocados, cherries, clementines, dates, figs, grapefruits, grapes, melons, nectarines, oranges, peaches, pears, pomegranates, strawberries, tangerines  Fats - Replace butter and margarine with healthy oils, such as olive oil, canola oil, and tahini  - Limit nuts to no more than a  handful a day  - Nuts include walnuts, almonds, pecans, pistachios, pine nuts  - Limit or avoid candied, honey roasted or heavily salted nuts - Olives are central to the Mediterranean diet - can be eaten whole or used in a variety of dishes   Meats Protein - Limiting red meat: no more than a few times a month - When eating red meat: choose lean cuts and keep the portion to the size of deck of cards - Eggs: approx. 0 to 4 times a week  - Fish and lean poultry: at least 2 a week  - Healthy protein sources include, chicken, turkey, lean beef, lamb - Increase intake of seafood such as tuna, salmon, trout, mackerel, shrimp, scallops - Avoid or limit high fat processed meats such as sausage and bacon  Dairy - Include moderate amounts of low fat dairy products  - Focus on healthy dairy such as fat free yogurt, skim milk, low or reduced fat cheese - Limit dairy products higher in fat such as whole or 2% milk, cheese, ice cream  Alcohol - Moderate amounts of red wine is ok  - No more than 5 oz daily for women (all ages) and men older than age 65  - No more than 10 oz of wine daily for men younger than 65  Other - Limit sweets and other desserts  - Use herbs and spices instead of salt to flavor foods  - Herbs and spices common to the traditional Mediterranean Diet include: basil, bay leaves, chives, cloves, cumin, fennel, garlic, lavender, marjoram, mint, oregano, parsley, pepper, rosemary, sage, savory, sumac, tarragon, thyme   It's not just a diet, it's a lifestyle:  The Mediterranean diet includes lifestyle factors typical of those in the   region  Foods, drinks and meals are best eaten with others and savored Daily physical activity is important for overall good health This could be strenuous exercise like running and aerobics This could also be more leisurely activities such as walking, housework, yard-work, or taking the stairs Moderation is the key; a balanced and healthy diet accommodates most  foods and drinks Consider portion sizes and frequency of consumption of certain foods   Meal Ideas & Options:  Breakfast:  Whole wheat toast or whole wheat English muffins with peanut butter & hard boiled egg Steel cut oats topped with apples & cinnamon and skim milk  Fresh fruit: banana, strawberries, melon, berries, peaches  Smoothies: strawberries, bananas, greek yogurt, peanut butter Low fat greek yogurt with blueberries and granola  Egg white omelet with spinach and mushrooms Breakfast couscous: whole wheat couscous, apricots, skim milk, cranberries  Sandwiches:  Hummus and grilled vegetables (peppers, zucchini, squash) on whole wheat bread   Grilled chicken on whole wheat pita with lettuce, tomatoes, cucumbers or tzatziki  [place] salad on whole wheat bread: tuna salad made with greek yogurt, olives, red peppers, capers, green onions Garlic rosemary lamb pita: lamb sauted with garlic, rosemary, salt & pepper; add lettuce, cucumber, greek yogurt to pita - flavor with lemon juice and black pepper  Seafood:  Mediterranean grilled salmon, seasoned with garlic, basil, parsley, lemon juice and black pepper Shrimp, lemon, and spinach whole-grain pasta salad made with low fat greek yogurt  Seared scallops with lemon orzo  Seared tuna steaks seasoned salt, pepper, coriander topped with tomato mixture of olives, tomatoes, olive oil, minced garlic, parsley, green onions and cappers  Meats:  Herbed greek chicken salad with kalamata olives, cucumber, feta  Red bell peppers stuffed with spinach, bulgur, lean ground beef (or lentils) & topped with feta   Kebabs: skewers of chicken, tomatoes, onions, zucchini, squash  [place] burgers: made with red onions, mint, dill, lemon juice, feta cheese topped with roasted red peppers Vegetarian Cucumber salad: cucumbers, artichoke hearts, celery, red onion, feta cheese, tossed in olive oil & lemon juice  Hummus and whole grain pita points with a greek salad  (lettuce, tomato, feta, olives, cucumbers, red onion) Lentil soup with celery, carrots made with vegetable broth, garlic, salt and pepper  Tabouli salad: parsley, bulgur, mint, scallions, cucumbers, tomato, radishes, lemon juice, olive oil, salt and pepper.     Mediterranean Diet A Mediterranean diet refers to food and lifestyle choices that are based on the traditions of countries located on the [employer]. It focuses on eating more fruits, vegetables, whole grains, beans, nuts, seeds, and heart-healthy fats, and eating less dairy, meat, eggs, and processed foods with added sugar, salt, and fat. This way of eating has been shown to help prevent certain conditions and improve outcomes for people who have chronic diseases, like kidney disease and heart disease. What are tips for following this plan? Reading food labels Check the serving size of packaged foods. For foods such as rice and pasta, the serving size refers to the amount of cooked product, not dry. Check the total fat in packaged foods. Avoid foods that have saturated fat or trans fats. Check the ingredient list for added sugars, such as corn syrup. Shopping  Buy a variety of foods that offer a balanced diet, including: Fresh fruits and vegetables (produce). Grains, beans, nuts, and seeds. Some of these may be available in unpackaged forms or large amounts (in bulk). Fresh seafood. Poultry and eggs. Low-fat dairy products. Buy whole  ingredients instead of prepackaged foods. Buy fresh fruits and vegetables in-season from local farmers markets. Buy plain frozen fruits and vegetables. If you do not have access to quality fresh seafood, buy precooked frozen shrimp or canned fish, such as tuna, salmon, or sardines. Stock your pantry so you always have certain foods on hand, such as olive oil, canned tuna, canned tomatoes, rice, pasta, and beans. Cooking Cook foods with extra-virgin olive oil instead of using butter or other  vegetable oils. Have meat as a side dish, and have vegetables or grains as your main dish. This means having meat in small portions or adding small amounts of meat to foods like pasta or stew. Use beans or vegetables instead of meat in common dishes like chili or lasagna. Experiment with different cooking methods. Try roasting, broiling, steaming, and sauting vegetables. Add frozen vegetables to soups, stews, pasta, or rice. Add nuts or seeds for added healthy fats and plant protein at each meal. You can add these to yogurt, salads, or vegetable dishes. Marinate fish or vegetables using olive oil, lemon juice, garlic, and fresh herbs. Meal planning Plan to eat one vegetarian meal one day each week. Try to work up to two vegetarian meals, if possible. Eat seafood two or more times a week. Have healthy snacks readily available, such as: Vegetable sticks with hummus. Greek yogurt. Fruit and nut trail mix. Eat balanced meals throughout the week. This includes: Fruit: 2-3 servings a day. Vegetables: 4-5 servings a day. Low-fat dairy: 2 servings a day. Fish, poultry, or lean meat: 1 serving a day. Beans and legumes: 2 or more servings a week. Nuts and seeds: 1-2 servings a day. Whole grains: 6-8 servings a day. Extra-virgin olive oil: 3-4 servings a day. Limit red meat and sweets to only a few servings a month. Lifestyle  Cook and eat meals together with your family, when possible. Drink enough fluid to keep your urine pale yellow. Be physically active every day. This includes: Aerobic exercise like running or swimming. Leisure activities like gardening, walking, or housework. Get 7-8 hours of sleep each night. If recommended by your health care provider, drink red wine in moderation. This means 1 glass a day for nonpregnant women and 2 glasses a day for men. A glass of wine equals 5 oz (150 mL). What foods should I eat? Fruits Apples. Apricots. Avocado. Berries. Bananas. Cherries.  Dates. Figs. Grapes. Lemons. Melon. Oranges. Peaches. Plums. Pomegranate. Vegetables Artichokes. Beets. Broccoli. Cabbage. Carrots. Eggplant. Green beans. Chard. Kale. Spinach. Onions. Leeks. Peas. Squash. Tomatoes. Peppers. Radishes. Grains Whole-grain pasta. Brown rice. Bulgur wheat. Polenta. Couscous. Whole-wheat bread. [place]. Meats and other proteins Beans. Almonds. Sunflower seeds. Pine nuts. Peanuts. [place]. Salmon. Scallops. Shrimp. [place]. Tilapia. Clams. Oysters. Eggs. Poultry without skin. Dairy Low-fat milk. Cheese. Greek yogurt. Fats and oils Extra-virgin olive oil. Avocado oil. Grapeseed oil. Beverages Water. Red wine. Herbal tea. Sweets and desserts Greek yogurt with honey. Baked apples. Poached pears. Trail mix. Seasonings and condiments Basil. Cilantro. Coriander. Cumin. Mint. Parsley. Sage. Rosemary. Tarragon. Garlic. Oregano. Thyme. Pepper. Balsamic vinegar. Tahini. Hummus. Tomato sauce. Olives. Mushrooms. The items listed above may not be a complete list of foods and beverages you can eat. Contact a dietitian for more information. What foods should I limit? This is a list of foods that should be eaten rarely or only on special occasions. Fruits Fruit canned in syrup. Vegetables Deep-fried potatoes (french fries). Grains Prepackaged pasta or rice dishes. Prepackaged cereal with added sugar. Prepackaged snacks with  added sugar. Meats and other proteins Beef. Pork. Lamb. Poultry with skin. Hot dogs. [place] Salines. Dairy Ice cream. Sour cream. Whole milk. Fats and oils Butter. Canola oil. Vegetable oil. Beef fat (tallow). Lard. Beverages Juice. Sugar-sweetened soft drinks. Beer. Liquor and spirits. Sweets and desserts Cookies. Cakes. Pies. Candy. Seasonings and condiments Mayonnaise. Pre-made sauces and marinades. The items listed above may not be a complete list of foods and beverages you should limit. Contact a dietitian for more information. Summary The  Mediterranean diet includes both food and lifestyle choices. Eat a variety of fresh fruits and vegetables, beans, nuts, seeds, and whole grains. Limit the amount of red meat and sweets that you eat. If recommended by your health care provider, drink red wine in moderation. This means 1 glass a day for nonpregnant women and 2 glasses a day for men. A glass of wine equals 5 oz (150 mL). This information is not intended to replace advice given to you by your health care provider. Make sure you discuss any questions you have with your health care provider. Document Revised: 06/04/2019 Document Reviewed: 04/01/2019 Elsevier Patient Education  [employer].

## 2022-03-28 LAB — THYROID PANEL WITH TSH
Free Thyroxine Index: 4 (ref 1.2–4.9)
T3 Uptake Ratio: 33 % (ref 24–39)
T4, Total: 12 ug/dL (ref 4.5–12.0)
TSH: 0.093 u[IU]/mL — ABNORMAL LOW (ref 0.450–4.500)

## 2022-05-02 ENCOUNTER — Ambulatory Visit: Payer: 59 | Attending: Cardiology

## 2022-05-02 DIAGNOSIS — R072 Precordial pain: Secondary | ICD-10-CM | POA: Diagnosis not present

## 2022-05-02 LAB — ECHOCARDIOGRAM COMPLETE
AR max vel: 2.23 cm2
AV Area VTI: 2.21 cm2
AV Area mean vel: 2.08 cm2
AV Mean grad: 4 mmHg
AV Peak grad: 6.8 mmHg
AV Vena cont: 0.3 cm
Ao pk vel: 1.3 m/s
Area-P 1/2: 2.82 cm2
Calc EF: 59 %
S' Lateral: 3.4 cm
Single Plane A2C EF: 57.9 %
Single Plane A4C EF: 61.2 %

## 2022-05-16 ENCOUNTER — Ambulatory Visit: Payer: 59 | Attending: Cardiology | Admitting: Cardiology

## 2022-05-16 ENCOUNTER — Encounter: Payer: Self-pay | Admitting: Cardiology

## 2022-05-16 VITALS — BP 120/68 | HR 67 | Ht 67.5 in | Wt 170.4 lb

## 2022-05-16 DIAGNOSIS — E7800 Pure hypercholesterolemia, unspecified: Secondary | ICD-10-CM

## 2022-05-16 DIAGNOSIS — R072 Precordial pain: Secondary | ICD-10-CM

## 2022-05-16 NOTE — Patient Instructions (Signed)
Medication Instructions:   Your physician recommends that you continue on your current medications as directed. Please refer to the Current Medication list given to you today.  *If you need a refill on your cardiac medications before your next appointment, please call your pharmacy*   Lab Work:  None Ordered  If you have labs (blood work) drawn today and your tests are completely normal, you will receive your results only by: MyChart Message (if you have MyChart) OR A paper copy in the mail If you have any lab test that is abnormal or we need to change your treatment, we will call you to review the results.   Testing/Procedures:  None Ordered   Follow-Up: At [facility], you and your health needs are our priority.  As part of our continuing mission to provide you with exceptional heart care, we have created designated Provider Care Teams.  These Care Teams include your primary Cardiologist (physician) and Advanced Practice Providers (APPs -  Physician Assistants and Nurse Practitioners) who all work together to provide you with the care you need, when you need it.  We recommend signing up for the patient portal called "MyChart".  Sign up information is provided on this After Visit Summary.  MyChart is used to connect with patients for Virtual Visits (Telemedicine).  Patients are able to view lab/test results, encounter notes, upcoming appointments, etc.  Non-urgent messages can be sent to your provider as well.   To learn more about what you can do with MyChart, go to [email or web address].    Your next appointment:    AS NEEDED  

## 2022-05-16 NOTE — Progress Notes (Signed)
Cardiology Office Note:    Date:  05/16/2022   ID:  Karen, Franco Jun 22, 1960, MRN [id number]  PCP:  Karen Patch, MD   [place] Providers Cardiologist:  None     Referring MD: Karen Patch, MD   Chief Complaint  Patient presents with   Follow-up    Testing f/u, no new cardiac concerns     History of Present Illness:    Karen Franco is a 62 y.o. female with a hx of hyperlipidemia, hypothyroidism who presents for follow-up.  Previously seen due to chest pain.    Due to family history with father having heart attack in his 26s, echo and coronary CTA was obtained to evaluate any significant cardiac disease.  She states feeling okay since last visit.  No further episodes of chest pain.  Primary care physician did take half of Zetia and monitoring, lipid levels while off medications.  Has no concerns at this time.   Past Medical History:  Diagnosis Date   Cancer ([place])    melanoma- on left ear   Dysrhythmia    palpatations if thyroid meds are "off"   Hypothyroidism    Motion sickness    cars - back seat   Seizures ([place])    22 yrs ago - controlled on Depakote   Thyroid disease     Past Surgical History:  Procedure Laterality Date   APPENDECTOMY     BREAST CYST ASPIRATION     breast cyst/ not sure which side-neg   CESAREAN SECTION     x 2   CHOLECYSTECTOMY N/A 03/01/2019   Procedure: LAPAROSCOPIC CHOLECYSTECTOMY;  Surgeon: Fredirick Maudlin, MD;  Location: [facility];  Service: General;  Laterality: N/A;   COLONOSCOPY WITH PROPOFOL N/A 01/20/2015   Procedure: COLONOSCOPY WITH PROPOFOL;  Surgeon: Lucilla Lame, MD;  Location: [place];  Service: Endoscopy;  Laterality: N/A;   COLONOSCOPY WITH PROPOFOL N/A 03/17/2020   Procedure: COLONOSCOPY WITH PROPOFOL;  Surgeon: Lucilla Lame, MD;  Location: [place];  Service: Endoscopy;  Laterality: N/A;  priority 4   MOHS SURGERY  05/2015   L) ear   POLYPECTOMY  01/20/2015   Procedure:  POLYPECTOMY;  Surgeon: Lucilla Lame, MD;  Location: [place];  Service: Endoscopy;;   POLYPECTOMY  03/17/2020   Procedure: POLYPECTOMY;  Surgeon: Lucilla Lame, MD;  Location: [place];  Service: Endoscopy;;   THYROIDECTOMY     VAGINAL HYSTERECTOMY      Current Medications: Current Meds  Medication Sig   Calcium Carbonate-Vitamin D (CALCIUM-VITAMIN D) 500-200 MG-UNIT per tablet Take 1 tablet by mouth daily.   cholecalciferol (VITAMIN D) 1000 units tablet Take 1,000 Units by mouth daily.   levothyroxine (SYNTHROID) 100 MCG tablet Take 1 tablet (100 mcg total) by mouth daily. Take with 69mg=188mcg   levothyroxine (SYNTHROID) 88 MCG tablet Take 1 tablet (88 mcg total) by mouth daily. Take with 1033m=188mcg daily   Multiple Vitamin (MULTIVITAMIN) capsule Take 1 capsule by mouth daily.   vitamin B-12 (CYANOCOBALAMIN) 1000 MCG tablet Take 1,000 mcg by mouth daily.     Allergies:   Patient has no known allergies.   Social History   Socioeconomic History   Marital status: Married    Spouse name: Not on file   Number of children: Not on file   Years of education: Not on file   Highest education level: Not on file  Occupational History   Not on file  Tobacco Use  Smoking status: Never   Smokeless tobacco: Never  Vaping Use   Vaping Use: Never used  Substance and Sexual Activity   Alcohol use: Not Currently    Comment: 2 glass wine/month   Drug use: No   Sexual activity: Yes  Other Topics Concern   Not on file  Social History Narrative   Not on file   Social Determinants of Health   Financial Resource Strain: Not on file  Food Insecurity: Not on file  Transportation Needs: Not on file  Physical Activity: Not on file  Stress: Not on file  Social Connections: Not on file     Family History: The patient's family history includes Breast cancer in her paternal aunt; Cancer in her mother; Diabetes in her maternal grandfather; Heart disease in her  father.  ROS:   Please see the history of present illness.     All other systems reviewed and are negative.  EKGs/Labs/Other Studies Reviewed:    The following studies were reviewed today:   EKG:  EKG is  ordered today.  The ekg ordered today demonstrates normal sinus rhythm, heart rate 70  Recent Labs: 01/16/2022: ALT 34; Hemoglobin 13.8; Platelets 335 03/11/2022: BUN 23; Creatinine, Ser 0.75; Potassium 4.2; Sodium 140 03/27/2022: TSH 0.093  Recent Lipid Panel    Component Value Date/Time   CHOL 195 08/08/2021 0805   TRIG 78 08/08/2021 0805   HDL 62 08/08/2021 0805   CHOLHDL 3.2 10/07/2017 0949   LDLCALC 119 (H) 08/08/2021 0805     Risk Assessment/Calculations:             Physical Exam:    VS:  BP 120/68 (BP Location: Left Arm, Patient Position: Sitting, Cuff Size: Normal)   Pulse 67   Ht 5' 7.5" (1.715 m)   Wt 170 lb 6.4 oz (77.3 kg)   SpO2 100%   BMI 26.29 kg/m     Wt Readings from Last 3 Encounters:  05/16/22 170 lb 6.4 oz (77.3 kg)  03/27/22 166 lb (75.3 kg)  03/11/22 167 lb 6.4 oz (75.9 kg)     GEN:  Well nourished, well developed in no acute distress HEENT: Normal NECK: No JVD; No carotid bruits CARDIAC: RRR, no murmurs, rubs, gallops RESPIRATORY:  Clear to auscultation without rales, wheezing or rhonchi  ABDOMEN: Soft, non-tender, non-distended MUSCULOSKELETAL:  No edema; No deformity  SKIN: Warm and dry NEUROLOGIC:  Alert and oriented x 3 PSYCHIATRIC:  Normal affect   ASSESSMENT:    1. Precordial pain   2. Pure hypercholesterolemia    PLAN:    In order of problems listed above:  Chest pain, coronary CT with calcium score 0, no evidence for CAD.  Echocardiogram EF 60 to 65%.  Patient made aware of results, reassured.  No further episodes of chest pain. Hyperlipidemia, recently treated on Zetia by PCP, continue low-cholesterol diet.  Follow-up as needed     Medication Adjustments/Labs and Tests Ordered: Current medicines are reviewed  at length with the patient today.  Concerns regarding medicines are outlined above.  No orders of the defined types were placed in this encounter.  No orders of the defined types were placed in this encounter.   There are no Patient Instructions on file for this visit.   Signed, Karen Sable, MD  05/16/2022 9:32 AM    [place]

## 2022-06-19 ENCOUNTER — Other Ambulatory Visit: Payer: Self-pay

## 2022-06-19 DIAGNOSIS — D329 Benign neoplasm of meninges, unspecified: Secondary | ICD-10-CM

## 2022-08-02 ENCOUNTER — Ambulatory Visit
Admission: RE | Admit: 2022-08-02 | Discharge: 2022-08-02 | Disposition: A | Discharge status: Home / Self Care | Payer: 59 | Source: Ambulatory Visit | Attending: Neurosurgery | Admitting: Neurosurgery

## 2022-08-02 DIAGNOSIS — D329 Benign neoplasm of meninges, unspecified: Secondary | ICD-10-CM | POA: Diagnosis present

## 2022-08-02 MED ORDER — GADOBUTROL 1 MMOL/ML IV SOLN
7.5000 mL | Freq: Once | INTRAVENOUS | Status: AC | PRN
  Administered 2022-08-02: 7.5 mL via INTRAVENOUS

## 2022-08-06 ENCOUNTER — Telehealth: Payer: Self-pay | Admitting: Neurosurgery

## 2022-08-06 NOTE — Telephone Encounter (Signed)
LVM

## 2022-08-07 ENCOUNTER — Telehealth: Payer: Self-pay | Admitting: Neurosurgery

## 2022-08-07 DIAGNOSIS — D329 Benign neoplasm of meninges, unspecified: Secondary | ICD-10-CM

## 2022-08-07 NOTE — Telephone Encounter (Signed)
She is returning your call from yesterday [phone]

## 2022-08-07 NOTE — Telephone Encounter (Signed)
Doing well.  MRI shows no recurrence.  Plan repeat MRI in 1 year.

## 2022-09-25 ENCOUNTER — Ambulatory Visit (INDEPENDENT_AMBULATORY_CARE_PROVIDER_SITE_OTHER): Payer: 59 | Admitting: Family Medicine

## 2022-09-25 ENCOUNTER — Encounter: Payer: Self-pay | Admitting: Family Medicine

## 2022-09-25 VITALS — BP 120/76 | HR 74 | Ht 67.5 in | Wt 167.0 lb

## 2022-09-25 DIAGNOSIS — E039 Hypothyroidism, unspecified: Secondary | ICD-10-CM | POA: Diagnosis not present

## 2022-09-25 DIAGNOSIS — E782 Mixed hyperlipidemia: Secondary | ICD-10-CM | POA: Diagnosis not present

## 2022-09-25 MED ORDER — LEVOTHYROXINE SODIUM 100 MCG PO TABS
100.0000 ug | ORAL_TABLET | Freq: Every day | ORAL | 5 refills | Status: DC
Start: 2022-09-25 — End: 2023-04-01

## 2022-09-25 MED ORDER — LEVOTHYROXINE SODIUM 88 MCG PO TABS: 88.0000 ug | ORAL_TABLET | Freq: Every day | ORAL | 5 refills | Status: DC

## 2022-09-25 NOTE — Progress Notes (Signed)
Date:  09/25/2022   Name:  Karen Franco   DOB:  10/09/1960   MRN:  [id number]   Chief Complaint: Hypothyroidism and Hyperlipidemia (No meds)  Hyperlipidemia This is a chronic problem. The current episode started more than 1 year ago. The problem is controlled. Recent lipid tests were reviewed and are normal. Exacerbating diseases include hypothyroidism. She has no history of chronic renal disease. There are no known factors aggravating her hyperlipidemia. Pertinent negatives include no chest pain, focal sensory loss, focal weakness, leg pain, myalgias or shortness of breath. She is currently on no antihyperlipidemic treatment. The current treatment provides moderate improvement of lipids. There are no compliance problems.  There are no known risk factors for coronary artery disease.  Thyroid Problem Presents for follow-up visit. Symptoms include cold intolerance and heat intolerance. Patient reports no anxiety, constipation, depressed mood, diaphoresis, diarrhea, dry skin, fatigue, hair loss, menstrual problem, nail problem, palpitations, visual change, weight gain or weight loss. Her past medical history is significant for hyperlipidemia.    Lab Results  Component Value Date   NA 140 03/11/2022   K 4.2 03/11/2022   CO2 27 03/11/2022   GLUCOSE 90 03/11/2022   BUN 23 (H) 03/11/2022   CREATININE 0.75 03/11/2022   CALCIUM 9.6 03/11/2022   EGFR 97 08/08/2021   GFRNONAA >60 03/11/2022   Lab Results  Component Value Date   CHOL 195 08/08/2021   HDL 62 08/08/2021   LDLCALC 119 (H) 08/08/2021   TRIG 78 08/08/2021   CHOLHDL 3.2 10/07/2017   Lab Results  Component Value Date   TSH 0.093 (L) 03/27/2022   Lab Results  Component Value Date   HGBA1C 5.2 08/08/2021   Lab Results  Component Value Date   WBC 7.0 01/16/2022   HGB 13.8 01/16/2022   HCT 42.1 01/16/2022   MCV 87.2 01/16/2022   PLT 335 01/16/2022   Lab Results  Component Value Date   ALT 34 01/16/2022   AST 42  (H) 01/16/2022   ALKPHOS 77 01/16/2022   BILITOT 0.5 01/16/2022   No results found for: "25OHVITD2", "25OHVITD3", "VD25OH"   Review of Systems  Constitutional:  Negative for diaphoresis, fatigue, weight gain and weight loss.  HENT:  Negative for trouble swallowing.   Eyes:  Negative for visual disturbance.  Respiratory:  Negative for chest tightness, shortness of breath and wheezing.   Cardiovascular:  Negative for chest pain and palpitations.  Gastrointestinal:  Negative for abdominal pain, constipation and diarrhea.  Endocrine: Positive for cold intolerance and heat intolerance. Negative for polydipsia and polyuria.  Genitourinary:  Negative for difficulty urinating, menstrual problem and vaginal bleeding.  Musculoskeletal:  Negative for myalgias.  Neurological:  Negative for focal weakness.  Psychiatric/Behavioral:  The patient is not nervous/anxious.     Patient Active Problem List   Diagnosis Date Noted   H/O total hysterectomy 07/17/2020   Polyp of sigmoid colon    History of colonic polyps 02/14/2020   Liver lesion, right lobe 05/06/2019   Abnormal MRI, thoracic spine 05/06/2019   Melanoma in situ of pinna of left ear (HCC) 05/06/2019   S/P laparoscopic cholecystectomy 03/02/2019   Acute cholecystitis 03/01/2019   Special screening for malignant neoplasms, colon    Benign neoplasm of ascending colon    Acquired hypothyroidism 12/07/2014   Taking medication for chronic disease 12/07/2014    No Known Allergies  Past Surgical History:  Procedure Laterality Date   APPENDECTOMY     BREAST CYST ASPIRATION  breast cyst/ not sure which side-neg   CESAREAN SECTION     x 2   CHOLECYSTECTOMY N/A 03/01/2019   Procedure: LAPAROSCOPIC CHOLECYSTECTOMY;  Surgeon: Duanne Guess, MD;  Location: [facility] ORS;  Service: General;  Laterality: N/A;   COLONOSCOPY WITH PROPOFOL N/A 01/20/2015   Procedure: COLONOSCOPY WITH PROPOFOL;  Surgeon: Midge Minium, MD;  Location: [facility];  Service: Endoscopy;  Laterality: N/A;   COLONOSCOPY WITH PROPOFOL N/A 03/17/2020   Procedure: COLONOSCOPY WITH PROPOFOL;  Surgeon: Midge Minium, MD;  Location: [facility];  Service: Endoscopy;  Laterality: N/A;  priority 4   MOHS SURGERY  05/2015   L) ear   POLYPECTOMY  01/20/2015   Procedure: POLYPECTOMY;  Surgeon: Midge Minium, MD;  Location: [facility];  Service: Endoscopy;;   POLYPECTOMY  03/17/2020   Procedure: POLYPECTOMY;  Surgeon: Midge Minium, MD;  Location: [facility];  Service: Endoscopy;;   THYROIDECTOMY     VAGINAL HYSTERECTOMY      Social History   Tobacco Use   Smoking status: Never   Smokeless tobacco: Never  Vaping Use   Vaping Use: Never used  Substance Use Topics   Alcohol use: Not Currently    Comment: 2 glass wine/month   Drug use: No     Medication list has been reviewed and updated.  Current Meds  Medication Sig   Calcium Carbonate-Vitamin D (CALCIUM-VITAMIN D) 500-200 MG-UNIT per tablet Take 1 tablet by mouth daily.   cholecalciferol (VITAMIN D) 1000 units tablet Take 1,000 Units by mouth daily.   levothyroxine (SYNTHROID) 100 MCG tablet Take 1 tablet (100 mcg total) by mouth daily. Take with 17mcg=188mcg   levothyroxine (SYNTHROID) 88 MCG tablet Take 1 tablet (88 mcg total) by mouth daily. Take with 147mcg=188mcg daily   Multiple Vitamin (MULTIVITAMIN) capsule Take 1 capsule by mouth daily.   vitamin B-12 (CYANOCOBALAMIN) 1000 MCG tablet Take 1,000 mcg by mouth daily.       09/25/2022    8:05 AM 03/27/2022    8:59 AM 11/28/2021   11:47 AM 09/24/2021   10:28 AM  GAD 7 : Generalized Anxiety Score  Nervous, Anxious, on Edge 0 0 0 0  Control/stop worrying 0 0 0 0  Worry too much - different things 0 0 0 0  Trouble relaxing 0 0 0 0  Restless 0 0 0 0  Easily annoyed or irritable 0 0 0 0  Afraid - awful might happen 0 0 0 0  Total GAD 7 Score 0 0 0 0  Anxiety Difficulty Not difficult at all Not difficult at all Not  difficult at all Not difficult at all       09/25/2022    8:05 AM 03/27/2022    8:59 AM 11/28/2021   11:46 AM  Depression screen PHQ 2/9  Decreased Interest 0 0 0  Down, Depressed, Hopeless 0 0 0  PHQ - 2 Score 0 0 0  Altered sleeping 0 0 0  Tired, decreased energy 0 0 0  Change in appetite 0 0 0  Feeling bad or failure about yourself  0 0 0  Trouble concentrating 0 0 0  Moving slowly or fidgety/restless 0 0 0  Suicidal thoughts 0 0 0  PHQ-9 Score 0 0 0  Difficult doing work/chores Not difficult at all Not difficult at all Not difficult at all    BP Readings from Last 3 Encounters:  09/25/22 120/76  05/16/22 120/68  03/27/22 120/76    Physical Exam Vitals  and nursing note reviewed.  Constitutional:      Appearance: She is well-developed.  HENT:     Head: Normocephalic.     Right Ear: Tympanic membrane and external ear normal.     Left Ear: Tympanic membrane and external ear normal.     Nose: No congestion or rhinorrhea.     Mouth/Throat:     Pharynx: Oropharynx is clear.  Eyes:     General: Lids are everted, no foreign bodies appreciated. No scleral icterus.       Left eye: No foreign body or hordeolum.     Conjunctiva/sclera: Conjunctivae normal.     Right eye: Right conjunctiva is not injected.     Left eye: Left conjunctiva is not injected.     Pupils: Pupils are equal, round, and reactive to light.  Neck:     Thyroid: No thyromegaly.     Vascular: No JVD.     Trachea: No tracheal deviation.  Cardiovascular:     Rate and Rhythm: Normal rate and regular rhythm.     Heart sounds: Normal heart sounds. No murmur heard.    No friction rub. No gallop.  Pulmonary:     Effort: Pulmonary effort is normal. No respiratory distress.     Breath sounds: Normal breath sounds. No wheezing, rhonchi or rales.  Abdominal:     General: Bowel sounds are normal.     Palpations: Abdomen is soft. There is no mass.     Tenderness: There is no abdominal tenderness. There is no  guarding or rebound.  Musculoskeletal:        General: No tenderness.     Cervical back: Neck supple.  Lymphadenopathy:     Cervical: No cervical adenopathy.  Skin:    General: Skin is warm.     Findings: No rash.  Neurological:     Mental Status: She is alert.     Deep Tendon Reflexes: Reflexes normal.     Reflex Scores:      Patellar reflexes are 2+ on the right side and 2+ on the left side. Psychiatric:        Mood and Affect: Mood is not anxious or depressed.     Wt Readings from Last 3 Encounters:  09/25/22 167 lb (75.8 kg)  05/16/22 170 lb 6.4 oz (77.3 kg)  03/27/22 166 lb (75.3 kg)    BP 120/76   Pulse 74   Ht 5' 7.5" (1.715 m)   Wt 167 lb (75.8 kg)   SpO2 98%   BMI 25.77 kg/m   Assessment and Plan:  1. Acquired hypothyroidism Chronic.  Controlled.  Stable.  Patient is doing well at current dosing of 188 mcg daily which is achieved with 100+88.  Will check thyroid panel for current level of control and will recheck in 6 months. - levothyroxine (SYNTHROID) 100 MCG tablet; Take 1 tablet (100 mcg total) by mouth daily. Take with 74mcg=188mcg  Dispense: 30 tablet; Refill: 5 - levothyroxine (SYNTHROID) 88 MCG tablet; Take 1 tablet (88 mcg total) by mouth daily. Take with 156mcg=188mcg daily  Dispense: 30 tablet; Refill: 5 - Thyroid Panel With TSH  2. Mixed hyperlipidemia Chronic.  Diet controlled.  Stable.  Will check lipid panel for current LDL control with dietary control as well as CMP to monitor alk phos and liver enzymes. - Lipid Panel With LDL/HDL Ratio - Comprehensive Metabolic Panel (CMET) - HgB A1c    Elizabeth Sauer, MD

## 2022-09-26 ENCOUNTER — Other Ambulatory Visit: Payer: Self-pay

## 2022-09-26 DIAGNOSIS — E782 Mixed hyperlipidemia: Secondary | ICD-10-CM

## 2022-09-26 LAB — LIPID PANEL WITH LDL/HDL RATIO
Cholesterol, Total: 279 mg/dL — ABNORMAL HIGH (ref 100–199)
HDL: 88 mg/dL (ref >39)
LDL Chol Calc (NIH): 182 mg/dL — ABNORMAL HIGH (ref 0–99)
LDL/HDL Ratio: 2.1 ratio (ref 0.0–3.2)
Triglycerides: 61 mg/dL (ref 0–149)
VLDL Cholesterol Cal: 9 mg/dL (ref 5–40)

## 2022-09-26 LAB — COMPREHENSIVE METABOLIC PANEL
ALT: 21 IU/L (ref 0–32)
AST: 18 IU/L (ref 0–40)
Albumin/Globulin Ratio: 1.7 (ref 1.2–2.2)
Albumin: 4.3 g/dL (ref 3.8–4.9)
Alkaline Phosphatase: 93 IU/L (ref 44–121)
BUN/Creatinine Ratio: 27 — ABNORMAL HIGH (ref 9–23)
BUN: 20 mg/dL (ref 6–24)
Bilirubin Total: 0.3 mg/dL (ref 0.0–1.2)
CO2: 27 mmol/L (ref 20–29)
Calcium: 10.2 mg/dL (ref 8.7–10.2)
Chloride: 102 mmol/L (ref 96–106)
Creatinine, Ser: 0.73 mg/dL (ref 0.57–1.00)
Globulin, Total: 2.6 g/dL (ref 1.5–4.5)
Glucose: 88 mg/dL (ref 70–99)
Potassium: 5.4 mmol/L — ABNORMAL HIGH (ref 3.5–5.2)
Sodium: 140 mmol/L (ref 134–144)
Total Protein: 6.9 g/dL (ref 6.0–8.5)
eGFR: 95 mL/min/{1.73_m2} (ref >59)

## 2022-09-26 LAB — HEMOGLOBIN A1C
Est. average glucose Bld gHb Est-mCnc: 100 mg/dL
Hgb A1c MFr Bld: 5.1 % (ref 4.8–5.6)

## 2022-09-26 LAB — THYROID PANEL WITH TSH
Free Thyroxine Index: 2.9 (ref 1.2–4.9)
T3 Uptake Ratio: 30 % (ref 24–39)
T4, Total: 9.8 ug/dL (ref 4.5–12.0)
TSH: 0.357 u[IU]/mL — ABNORMAL LOW (ref 0.450–4.500)

## 2022-09-26 MED ORDER — EZETIMIBE 10 MG PO TABS
10.0000 mg | ORAL_TABLET | Freq: Every day | ORAL | 5 refills | Status: DC
Start: 2022-09-26 — End: 2023-04-01

## 2022-11-26 IMAGING — MG MM DIGITAL SCREENING BILAT W/ TOMO AND CAD
8 series · 8 of 24 positions shown · non-contrast
Comparison: Previous exam(s).

CLINICAL DATA: Screening.

EXAM:
DIGITAL SCREENING BILATERAL MAMMOGRAM WITH TOMOSYNTHESIS AND CAD
TECHNIQUE: Bilateral screening digital craniocaudal and mediolateral oblique
mammograms were obtained. Bilateral screening digital breast
tomosynthesis was performed. The images were evaluated with
computer-aided detection.

[L MLO synth-2D]
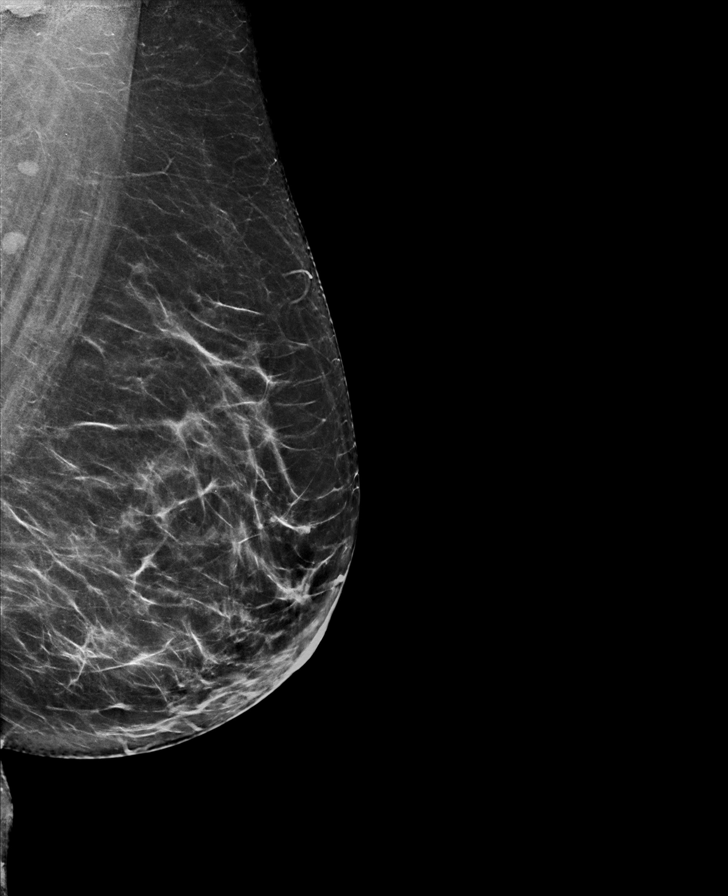

[R CC synth-2D]
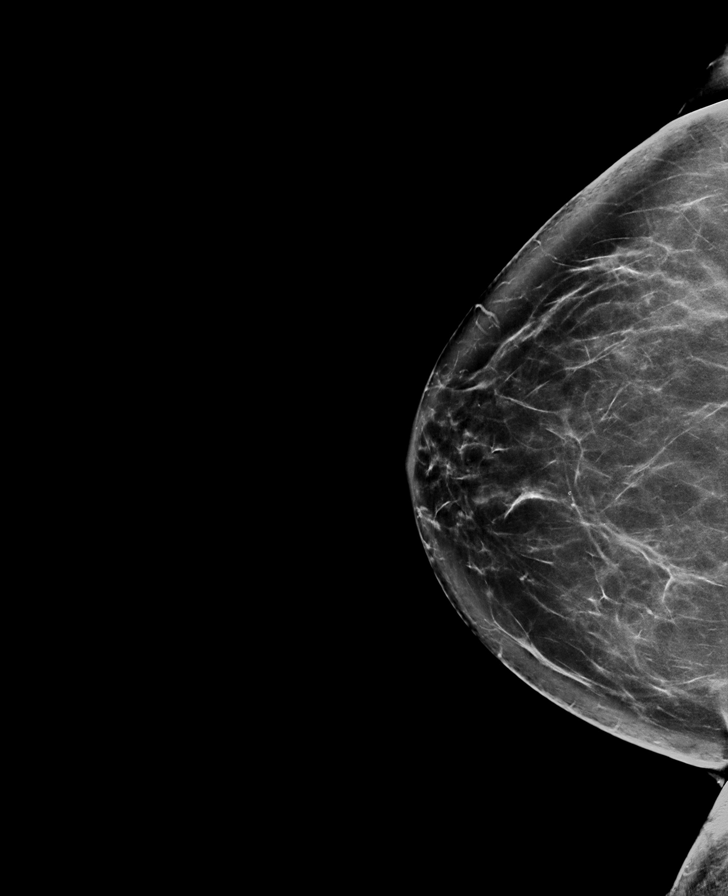

[R MLO synth-2D]
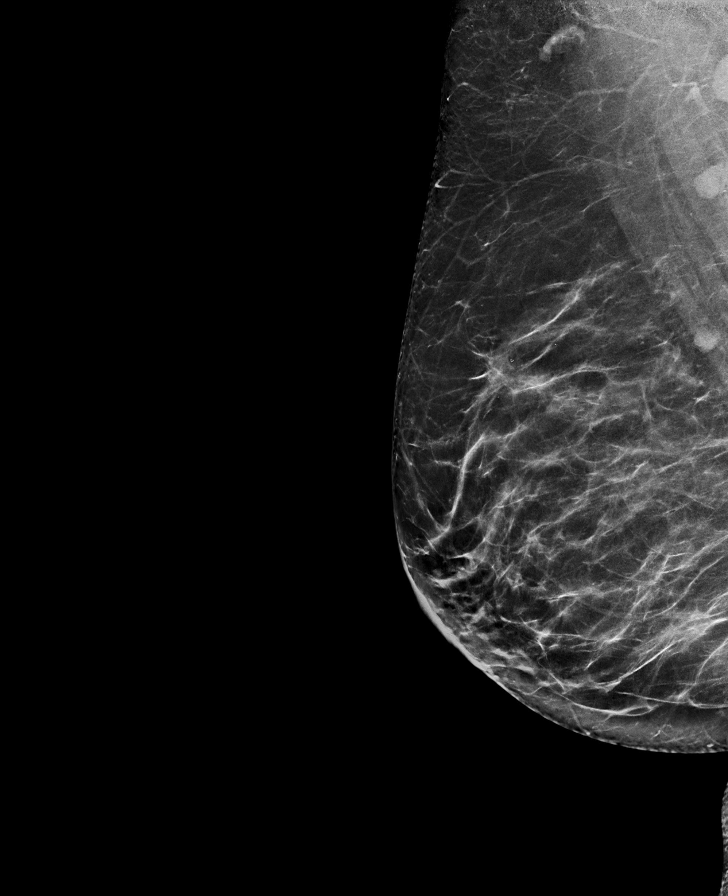

[L CC synth-2D]
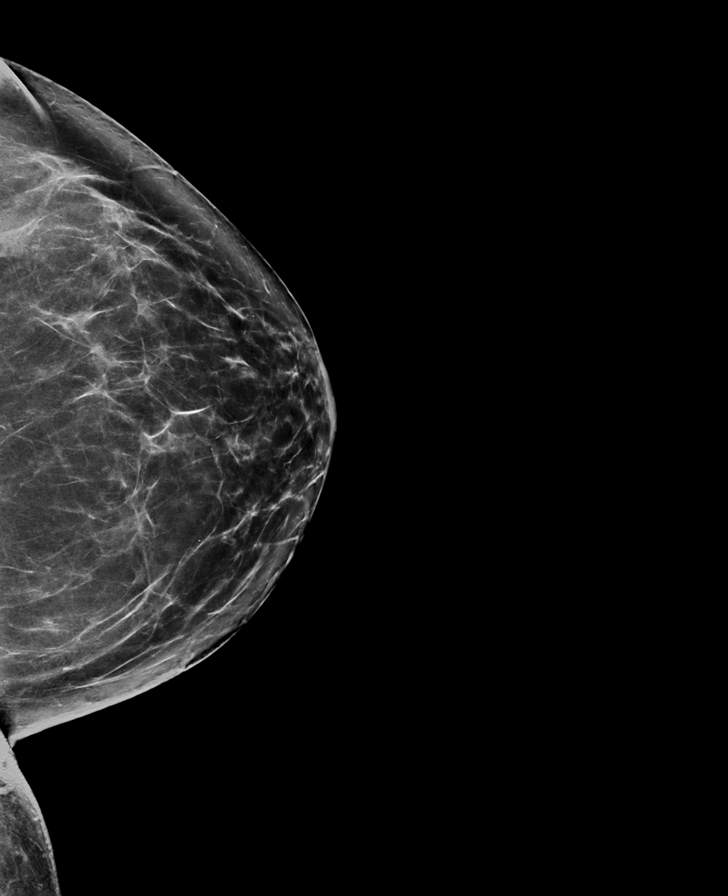

[R MLO tomo · tomo slice 42/83.0]
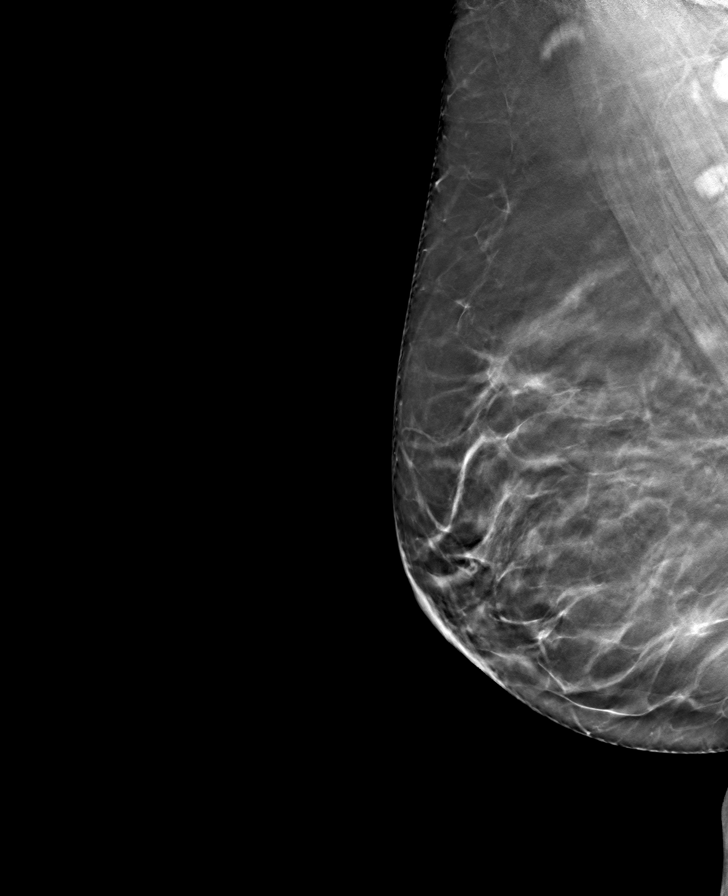

[L CC tomo · tomo slice 43/84.0]
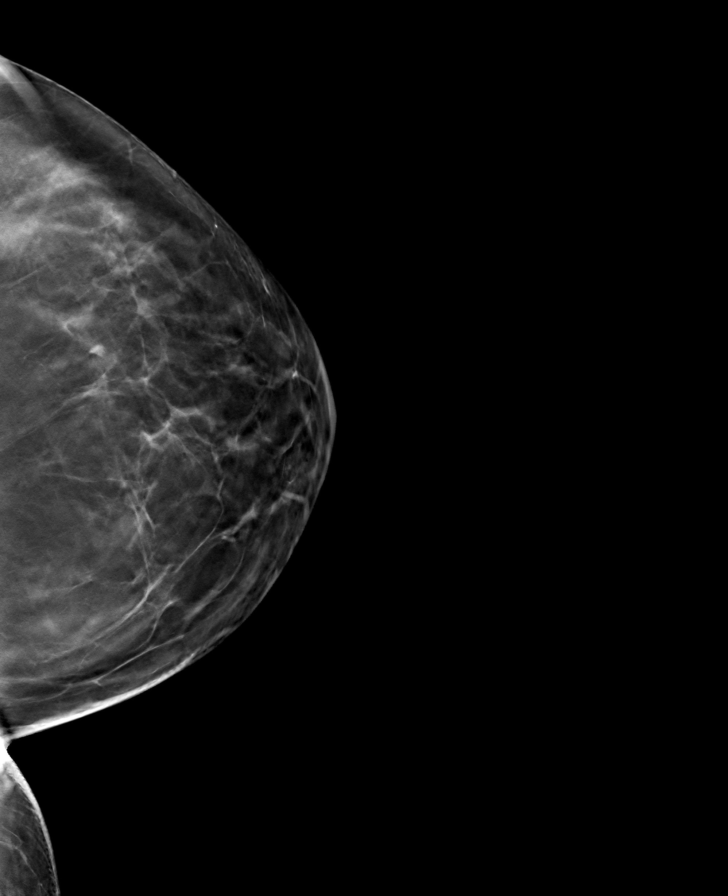

[L MLO tomo · tomo slice 43/85.0]
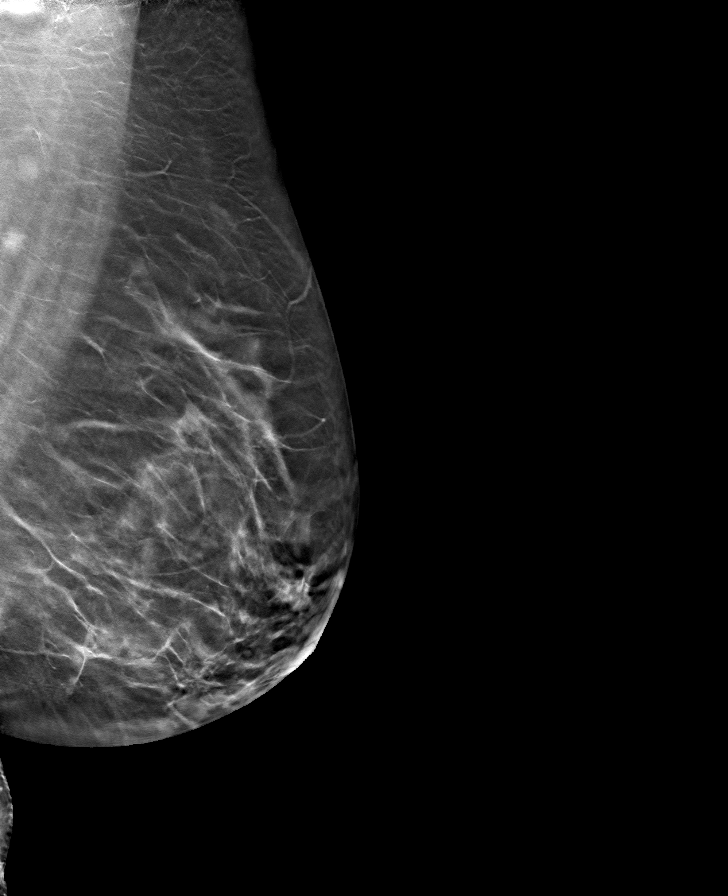

[R CC tomo · tomo slice 45/90.0]
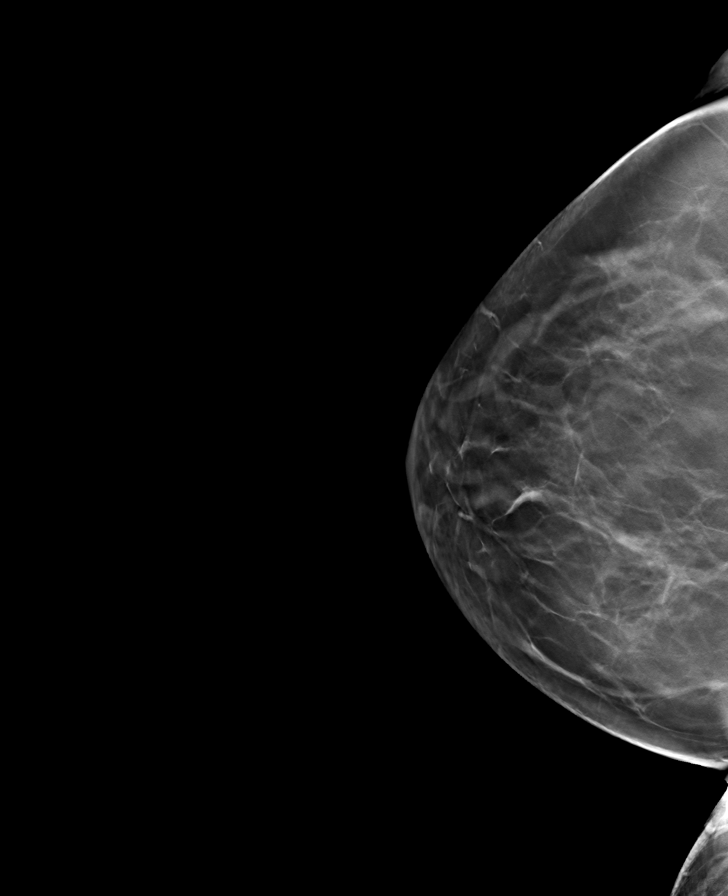

[8 of 24 positions shown; findings below may reference images not displayed]

ACR Breast Density Category b: There are scattered areas of
fibroglandular density.
FINDINGS: There are no findings suspicious for malignancy. The images were
evaluated with computer-aided detection.
IMPRESSION: No mammographic evidence of malignancy. A result letter of this
screening mammogram will be mailed directly to the patient.

RECOMMENDATION:
Screening mammogram in one year. (Code:WJ-I-BG6)

BI-RADS CATEGORY  1: Negative.

## 2023-04-01 ENCOUNTER — Ambulatory Visit (INDEPENDENT_AMBULATORY_CARE_PROVIDER_SITE_OTHER): Payer: 59 | Admitting: Family Medicine

## 2023-04-01 ENCOUNTER — Encounter: Payer: Self-pay | Admitting: Family Medicine

## 2023-04-01 VITALS — BP 117/78 | HR 73 | Resp 16 | Ht 67.5 in | Wt 170.3 lb

## 2023-04-01 DIAGNOSIS — E039 Hypothyroidism, unspecified: Secondary | ICD-10-CM | POA: Diagnosis not present

## 2023-04-01 DIAGNOSIS — Z23 Encounter for immunization: Secondary | ICD-10-CM | POA: Diagnosis not present

## 2023-04-01 DIAGNOSIS — E782 Mixed hyperlipidemia: Secondary | ICD-10-CM

## 2023-04-01 MED ORDER — LEVOTHYROXINE SODIUM 100 MCG PO TABS: 100.0000 ug | ORAL_TABLET | Freq: Every day | ORAL | 5 refills | Status: DC

## 2023-04-01 MED ORDER — LEVOTHYROXINE SODIUM 88 MCG PO TABS: 88.0000 ug | ORAL_TABLET | Freq: Every day | ORAL | 5 refills | Status: DC

## 2023-04-01 MED ORDER — EZETIMIBE 10 MG PO TABS: 10.0000 mg | ORAL_TABLET | Freq: Every day | ORAL | 5 refills | Status: DC

## 2023-04-01 NOTE — Addendum Note (Signed)
Addended by: Elizabeth Sauer C on: 04/01/2023 12:16 PM   Modules accepted: Level of Service

## 2023-04-01 NOTE — Progress Notes (Signed)
Date:  04/01/2023   Name:  Karen Franco   DOB:  Oct 18, 1960   MRN:  [id number]   Chief Complaint: Hypertension  Hypertension This is a chronic problem. The current episode started more than 1 year ago. The problem has been gradually improving since onset. The problem is controlled. Pertinent negatives include no anxiety, blurred vision, chest pain, headaches, malaise/fatigue, neck pain, orthopnea, palpitations, peripheral edema, PND, shortness of breath or sweats. There are no associated agents to hypertension. Past treatments include beta blockers. The current treatment provides moderate improvement. There are no compliance problems.  There is no history of CAD/MI or CVA. There is no history of chronic renal disease, a hypertension causing med or renovascular disease.  Hyperlipidemia This is a chronic problem. The current episode started more than 1 year ago. The problem is controlled. She has no history of chronic renal disease. There are no known factors aggravating her hyperlipidemia. Pertinent negatives include no chest pain or shortness of breath. Current antihyperlipidemic treatment includes ezetimibe. The current treatment provides moderate improvement of lipids. There are no compliance problems.     Lab Results  Component Value Date   NA 140 09/25/2022   K 5.4 (H) 09/25/2022   CO2 27 09/25/2022   GLUCOSE 88 09/25/2022   BUN 20 09/25/2022   CREATININE 0.73 09/25/2022   CALCIUM 10.2 09/25/2022   EGFR 95 09/25/2022   GFRNONAA >60 03/11/2022   Lab Results  Component Value Date   CHOL 279 (H) 09/25/2022   HDL 88 09/25/2022   LDLCALC 182 (H) 09/25/2022   TRIG 61 09/25/2022   CHOLHDL 3.2 10/07/2017   Lab Results  Component Value Date   TSH 0.357 (L) 09/25/2022   Lab Results  Component Value Date   HGBA1C 5.1 09/25/2022   Lab Results  Component Value Date   WBC 7.0 01/16/2022   HGB 13.8 01/16/2022   HCT 42.1 01/16/2022   MCV 87.2 01/16/2022   PLT 335  01/16/2022   Lab Results  Component Value Date   ALT 21 09/25/2022   AST 18 09/25/2022   ALKPHOS 93 09/25/2022   BILITOT 0.3 09/25/2022   No results found for: "25OHVITD2", "25OHVITD3", "VD25OH"   Review of Systems  Constitutional:  Negative for malaise/fatigue and unexpected weight change.  HENT:  Negative for trouble swallowing and voice change.   Eyes:  Negative for blurred vision and visual disturbance.  Respiratory:  Negative for cough, choking, shortness of breath and wheezing.   Cardiovascular:  Negative for chest pain, palpitations, orthopnea and PND.  Gastrointestinal:  Negative for blood in stool.  Endocrine: Negative for polydipsia and polyuria.  Genitourinary:  Negative for hematuria and vaginal bleeding.  Musculoskeletal:  Negative for neck pain.  Neurological:  Negative for headaches.    Patient Active Problem List   Diagnosis Date Noted   H/O total hysterectomy 07/17/2020   Polyp of sigmoid colon    History of colonic polyps 02/14/2020   Liver lesion, right lobe 05/06/2019   Abnormal MRI, thoracic spine 05/06/2019   Melanoma in situ of pinna of left ear (HCC) 05/06/2019   S/P laparoscopic cholecystectomy 03/02/2019   Acute cholecystitis 03/01/2019   Special screening for malignant neoplasms, colon    Benign neoplasm of ascending colon    Acquired hypothyroidism 12/07/2014   Taking medication for chronic disease 12/07/2014    No Known Allergies  Past Surgical History:  Procedure Laterality Date   APPENDECTOMY     BREAST CYST ASPIRATION  breast cyst/ not sure which side-neg   CESAREAN SECTION     x 2   CHOLECYSTECTOMY N/A 03/01/2019   Procedure: LAPAROSCOPIC CHOLECYSTECTOMY;  Surgeon: Duanne Guess, MD;  Location: [facility] ORS;  Service: General;  Laterality: N/A;   COLONOSCOPY WITH PROPOFOL N/A 01/20/2015   Procedure: COLONOSCOPY WITH PROPOFOL;  Surgeon: Midge Minium, MD;  Location: [facility];  Service: Endoscopy;  Laterality: N/A;    COLONOSCOPY WITH PROPOFOL N/A 03/17/2020   Procedure: COLONOSCOPY WITH PROPOFOL;  Surgeon: Midge Minium, MD;  Location: [facility];  Service: Endoscopy;  Laterality: N/A;  priority 4   MOHS SURGERY  05/2015   L) ear   POLYPECTOMY  01/20/2015   Procedure: POLYPECTOMY;  Surgeon: Midge Minium, MD;  Location: [facility];  Service: Endoscopy;;   POLYPECTOMY  03/17/2020   Procedure: POLYPECTOMY;  Surgeon: Midge Minium, MD;  Location: [facility];  Service: Endoscopy;;   THYROIDECTOMY     VAGINAL HYSTERECTOMY      Social History   Tobacco Use   Smoking status: Never   Smokeless tobacco: Never  Vaping Use   Vaping status: Never Used  Substance Use Topics   Alcohol use: Not Currently    Comment: 2 glass wine/month   Drug use: No     Medication list has been reviewed and updated.  Current Meds  Medication Sig   Calcium Carbonate-Vitamin D (CALCIUM-VITAMIN D) 500-200 MG-UNIT per tablet Take 1 tablet by mouth daily.   cholecalciferol (VITAMIN D) 1000 units tablet Take 1,000 Units by mouth daily.   ezetimibe (ZETIA) 10 MG tablet Take 1 tablet (10 mg total) by mouth daily.   levothyroxine (SYNTHROID) 100 MCG tablet Take 1 tablet (100 mcg total) by mouth daily. Take with 58mcg=188mcg   levothyroxine (SYNTHROID) 88 MCG tablet Take 1 tablet (88 mcg total) by mouth daily. Take with 195mcg=188mcg daily   Multiple Vitamin (MULTIVITAMIN) capsule Take 1 capsule by mouth daily.   vitamin B-12 (CYANOCOBALAMIN) 1000 MCG tablet Take 1,000 mcg by mouth daily.       09/25/2022    8:05 AM 03/27/2022    8:59 AM 11/28/2021   11:47 AM 09/24/2021   10:28 AM  GAD 7 : Generalized Anxiety Score  Nervous, Anxious, on Edge 0 0 0 0  Control/stop worrying 0 0 0 0  Worry too much - different things 0 0 0 0  Trouble relaxing 0 0 0 0  Restless 0 0 0 0  Easily annoyed or irritable 0 0 0 0  Afraid - awful might happen 0 0 0 0  Total GAD 7 Score 0 0 0 0  Anxiety Difficulty Not difficult at  all Not difficult at all Not difficult at all Not difficult at all       04/01/2023    8:40 AM 09/25/2022    8:05 AM 03/27/2022    8:59 AM  Depression screen PHQ 2/9  Decreased Interest 0 0 0  Down, Depressed, Hopeless 0 0 0  PHQ - 2 Score 0 0 0  Altered sleeping  0 0  Tired, decreased energy  0 0  Change in appetite  0 0  Feeling bad or failure about yourself   0 0  Trouble concentrating  0 0  Moving slowly or fidgety/restless  0 0  Suicidal thoughts  0 0  PHQ-9 Score  0 0  Difficult doing work/chores  Not difficult at all Not difficult at all    BP Readings from Last 3 Encounters:  04/01/23 117/78  09/25/22 120/76  05/16/22 120/68    Physical Exam Vitals and nursing note reviewed. Exam conducted with a chaperone present.  Constitutional:      General: She is not in acute distress.    Appearance: She is not diaphoretic.  HENT:     Head: Normocephalic and atraumatic.     Right Ear: Tympanic membrane and external ear normal. There is no impacted cerumen.     Left Ear: Tympanic membrane and external ear normal.     Nose: Nose normal. No congestion or rhinorrhea.     Mouth/Throat:     Mouth: Mucous membranes are moist.     Pharynx: No oropharyngeal exudate.  Eyes:     General:        Right eye: No discharge.        Left eye: No discharge.     Conjunctiva/sclera: Conjunctivae normal.     Pupils: Pupils are equal, round, and reactive to light.  Neck:     Thyroid: No thyromegaly.     Vascular: No JVD.  Cardiovascular:     Rate and Rhythm: Normal rate and regular rhythm.     Heart sounds: Normal heart sounds. No murmur heard.    No friction rub. No gallop.  Pulmonary:     Effort: Pulmonary effort is normal.     Breath sounds: Normal breath sounds. No wheezing, rhonchi or rales.  Abdominal:     General: Bowel sounds are normal.     Palpations: Abdomen is soft. There is no mass.     Tenderness: There is no abdominal tenderness. There is no guarding or rebound.   Musculoskeletal:        General: Normal range of motion.     Cervical back: Normal range of motion and neck supple.  Lymphadenopathy:     Cervical: No cervical adenopathy.  Skin:    General: Skin is warm and dry.  Neurological:     Mental Status: She is alert.     Motor: No weakness.     Deep Tendon Reflexes: Reflexes are normal and symmetric.     Wt Readings from Last 3 Encounters:  04/01/23 170 lb 4.8 oz (77.2 kg)  09/25/22 167 lb (75.8 kg)  05/16/22 170 lb 6.4 oz (77.3 kg)    BP 117/78   Pulse 73   Resp 16   Ht 5' 7.5" (1.715 m)   Wt 170 lb 4.8 oz (77.2 kg)   SpO2 100%   BMI 26.28 kg/m   Assessment and Plan: 1. Mixed hyperlipidemia Chronic.  Controlled.  Stable.  Asymptomatic.  Tolerating medication well.  Without muscle weakness or myalgias.  Continue Zetia 10 mg once a day.  Will check lipid panel for current level of LDL and triglyceride control.  Will check CMP for hepatic concerns.  Will recheck patient in 6 months - ezetimibe (ZETIA) 10 MG tablet; Take 1 tablet (10 mg total) by mouth daily.  Dispense: 30 tablet; Refill: 5 - Lipid Panel With LDL/HDL Ratio - Comprehensive metabolic panel  2. Acquired hypothyroidism Chronic.  Controlled.  Stable.  Asymptomatic.  Tolerating current dosing of alternating levothyroxine 100 mcg with levothyroxine 88 mcg daily.  Will check TSH for current level of control and adjust accordingly.  Will recheck patient in 6 months - levothyroxine (SYNTHROID) 100 MCG tablet; Take 1 tablet (100 mcg total) by mouth daily. Take with 7mcg=188mcg  Dispense: 30 tablet; Refill: 5 - levothyroxine (SYNTHROID) 88 MCG tablet; Take 1 tablet (88 mcg  total) by mouth daily. Take with 125mcg=188mcg daily  Dispense: 30 tablet; Refill: 5 - TSH     Elizabeth Sauer, MD

## 2023-04-02 ENCOUNTER — Encounter: Payer: Self-pay | Admitting: Family Medicine

## 2023-04-02 LAB — COMPREHENSIVE METABOLIC PANEL
ALT: 35 [IU]/L — ABNORMAL HIGH (ref 0–32)
AST: 40 [IU]/L (ref 0–40)
Albumin: 4.2 g/dL (ref 3.8–4.9)
Alkaline Phosphatase: 103 [IU]/L (ref 44–121)
BUN/Creatinine Ratio: 23 (ref 12–28)
BUN: 15 mg/dL (ref 8–27)
Bilirubin Total: 0.4 mg/dL (ref 0.0–1.2)
CO2: 26 mmol/L (ref 20–29)
Calcium: 10.3 mg/dL (ref 8.7–10.3)
Chloride: 103 mmol/L (ref 96–106)
Creatinine, Ser: 0.64 mg/dL (ref 0.57–1.00)
Globulin, Total: 2.3 g/dL (ref 1.5–4.5)
Glucose: 83 mg/dL (ref 70–99)
Potassium: 5 mmol/L (ref 3.5–5.2)
Sodium: 141 mmol/L (ref 134–144)
Total Protein: 6.5 g/dL (ref 6.0–8.5)
eGFR: 101 mL/min/{1.73_m2} (ref >59)

## 2023-04-02 LAB — LIPID PANEL WITH LDL/HDL RATIO
Cholesterol, Total: 230 mg/dL — ABNORMAL HIGH (ref 100–199)
HDL: 71 mg/dL (ref >39)
LDL Chol Calc (NIH): 140 mg/dL — ABNORMAL HIGH (ref 0–99)
LDL/HDL Ratio: 2 ratio (ref 0.0–3.2)
Triglycerides: 109 mg/dL (ref 0–149)
VLDL Cholesterol Cal: 19 mg/dL (ref 5–40)

## 2023-04-02 LAB — TSH: TSH: 0.674 u[IU]/mL (ref 0.450–4.500)

## 2023-04-02 NOTE — Patient Instructions (Signed)

## 2023-04-15 ENCOUNTER — Other Ambulatory Visit: Payer: Self-pay | Admitting: Family Medicine

## 2023-04-15 ENCOUNTER — Encounter: Payer: Self-pay | Admitting: Family Medicine

## 2023-04-15 MED ORDER — ROSUVASTATIN CALCIUM 5 MG PO TABS: 5.0000 mg | ORAL_TABLET | Freq: Every day | ORAL | 2 refills | Status: DC

## 2023-07-19 ENCOUNTER — Other Ambulatory Visit: Payer: Self-pay | Admitting: Family Medicine

## 2023-07-25 ENCOUNTER — Ambulatory Visit
Admission: RE | Admit: 2023-07-25 | Discharge: 2023-07-25 | Disposition: A | Discharge status: Home / Self Care | Source: Ambulatory Visit | Attending: Neurosurgery | Admitting: Neurosurgery

## 2023-07-25 DIAGNOSIS — D329 Benign neoplasm of meninges, unspecified: Secondary | ICD-10-CM | POA: Insufficient documentation

## 2023-07-25 MED ORDER — GADOBUTROL 1 MMOL/ML IV SOLN
7.0000 mL | Freq: Once | INTRAVENOUS | Status: AC | PRN
  Administered 2023-07-25: 7 mL via INTRAVENOUS

## 2023-08-18 ENCOUNTER — Other Ambulatory Visit: Payer: Self-pay | Admitting: Family Medicine

## 2023-08-19 ENCOUNTER — Other Ambulatory Visit: Payer: Self-pay | Admitting: Family Medicine

## 2023-08-19 ENCOUNTER — Other Ambulatory Visit: Payer: Self-pay

## 2023-09-15 ENCOUNTER — Other Ambulatory Visit: Payer: Self-pay | Admitting: Family Medicine

## 2023-09-18 ENCOUNTER — Encounter: Payer: Self-pay | Admitting: Family Medicine

## 2023-09-18 ENCOUNTER — Ambulatory Visit (INDEPENDENT_AMBULATORY_CARE_PROVIDER_SITE_OTHER): Payer: 59 | Admitting: Family Medicine

## 2023-09-18 VITALS — BP 98/62 | HR 66 | Ht 67.5 in | Wt 181.0 lb

## 2023-09-18 DIAGNOSIS — E782 Mixed hyperlipidemia: Secondary | ICD-10-CM | POA: Diagnosis not present

## 2023-09-18 DIAGNOSIS — E039 Hypothyroidism, unspecified: Secondary | ICD-10-CM | POA: Diagnosis not present

## 2023-09-18 DIAGNOSIS — Z1231 Encounter for screening mammogram for malignant neoplasm of breast: Secondary | ICD-10-CM | POA: Diagnosis not present

## 2023-09-18 MED ORDER — LEVOTHYROXINE SODIUM 88 MCG PO TABS
88.0000 ug | ORAL_TABLET | Freq: Every day | ORAL | 5 refills | Status: DC
Start: 2023-09-18 — End: 2024-01-05

## 2023-09-18 MED ORDER — ROSUVASTATIN CALCIUM 5 MG PO TABS: 5.0000 mg | ORAL_TABLET | Freq: Every day | ORAL | 5 refills | Status: DC

## 2023-09-18 MED ORDER — LEVOTHYROXINE SODIUM 100 MCG PO TABS
100.0000 ug | ORAL_TABLET | Freq: Every day | ORAL | 5 refills | Status: DC
Start: 2023-09-18 — End: 2024-01-05

## 2023-09-18 MED ORDER — EZETIMIBE 10 MG PO TABS
10.0000 mg | ORAL_TABLET | Freq: Every day | ORAL | 5 refills | Status: DC
Start: 2023-09-18 — End: 2024-02-09

## 2023-09-18 NOTE — Progress Notes (Signed)
 Date:  09/18/2023   Name:  Karen Franco   DOB:  [birth year]   MRN:  [id number]   Chief Complaint: Medication Refill (Patient presents today for refills on her thyroid  and cholesterol medications. She is dong well and taking her medications as directed.  )  Hyperlipidemia This is a chronic problem. The current episode started more than 1 year ago. The problem is resistant. Recent lipid tests were reviewed and are normal. She has no history of chronic renal disease, diabetes, hypothyroidism, liver disease, obesity or nephrotic syndrome. There are no known factors aggravating her hyperlipidemia. Pertinent negatives include no chest pain or shortness of breath. Current antihyperlipidemic treatment includes statins. The current treatment provides moderate improvement of lipids. There are no compliance problems.  Risk factors for coronary artery disease include dyslipidemia.  Thyroid  Problem Presents for follow-up visit. Symptoms include diaphoresis. Patient reports no anxiety, cold intolerance, constipation, depressed mood, diarrhea, dry skin, fatigue, hair loss, heat intolerance, hoarse voice, menstrual problem, nail problem, palpitations, visual change, weight gain or weight loss. Her past medical history is significant for hyperlipidemia. There is no history of diabetes.    Lab Results  Component Value Date   NA 141 04/01/2023   K 5.0 04/01/2023   CO2 26 04/01/2023   GLUCOSE 83 04/01/2023   BUN 15 04/01/2023   CREATININE 0.64 04/01/2023   CALCIUM  10.3 04/01/2023   EGFR 101 04/01/2023   GFRNONAA >60 03/11/2022   Lab Results  Component Value Date   CHOL 230 (H) 04/01/2023   HDL 71 04/01/2023   LDLCALC 140 (H) 04/01/2023   TRIG 109 04/01/2023   CHOLHDL 3.2 10/07/2017   Lab Results  Component Value Date   TSH 0.674 04/01/2023   Lab Results  Component Value Date   HGBA1C 5.1 09/25/2022   Lab Results  Component Value Date   WBC 7.0 01/16/2022   HGB 13.8 01/16/2022   HCT  42.1 01/16/2022   MCV 87.2 01/16/2022   PLT 335 01/16/2022   Lab Results  Component Value Date   ALT 35 (H) 04/01/2023   AST 40 04/01/2023   ALKPHOS 103 04/01/2023   BILITOT 0.4 04/01/2023   No results found for: "25OHVITD2", "25OHVITD3", "VD25OH"   Review of Systems  Constitutional:  Positive for diaphoresis. Negative for fatigue, weight gain and weight loss.  HENT:  Negative for hoarse voice.   Respiratory:  Negative for cough, shortness of breath and wheezing.   Cardiovascular:  Negative for chest pain, palpitations and leg swelling.  Gastrointestinal:  Negative for constipation and diarrhea.  Endocrine: Negative for cold intolerance and heat intolerance.  Genitourinary:  Negative for menstrual problem.  Psychiatric/Behavioral:  The patient is not nervous/anxious.     Patient Active Problem List   Diagnosis Date Noted   H/O total hysterectomy 07/17/2020   Polyp of sigmoid colon    History of colonic polyps 02/14/2020   Liver lesion, right lobe 05/06/2019   Abnormal MRI, thoracic spine 05/06/2019   Melanoma in situ of pinna of left ear (HCC) 05/06/2019   S/P laparoscopic cholecystectomy 03/02/2019   Acute cholecystitis 03/01/2019   Special screening for malignant neoplasms, colon    Benign neoplasm of ascending colon    Acquired hypothyroidism 12/07/2014   Taking medication for chronic disease 12/07/2014    No Known Allergies  Past Surgical History:  Procedure Laterality Date   APPENDECTOMY     BREAST CYST ASPIRATION     breast cyst/ not sure which side-neg  CESAREAN SECTION     x 2   CHOLECYSTECTOMY N/A 03/01/2019   Procedure: LAPAROSCOPIC CHOLECYSTECTOMY;  Surgeon: Mercy Stall, MD;  Location: [facility];  Service: General;  Laterality: N/A;   COLONOSCOPY WITH PROPOFOL  N/A 01/20/2015   Procedure: COLONOSCOPY WITH PROPOFOL ;  Surgeon: Marnee Sink, MD;  Location: [facility];  Service: Endoscopy;  Laterality: N/A;   COLONOSCOPY WITH PROPOFOL  N/A  03/17/2020   Procedure: COLONOSCOPY WITH PROPOFOL ;  Surgeon: Marnee Sink, MD;  Location: [facility];  Service: Endoscopy;  Laterality: N/A;  priority 4   MOHS SURGERY  05/2015   L) ear   POLYPECTOMY  01/20/2015   Procedure: POLYPECTOMY;  Surgeon: Marnee Sink, MD;  Location: [facility];  Service: Endoscopy;;   POLYPECTOMY  03/17/2020   Procedure: POLYPECTOMY;  Surgeon: Marnee Sink, MD;  Location: [facility];  Service: Endoscopy;;   THYROIDECTOMY     VAGINAL HYSTERECTOMY      Social History   Tobacco Use   Smoking status: Never   Smokeless tobacco: Never  Vaping Use   Vaping status: Never Used  Substance Use Topics   Alcohol use: Not Currently    Comment: 2 glass wine/month   Drug use: No     Medication list has been reviewed and updated.  Current Meds  Medication Sig   Calcium  Carbonate-Vitamin D (CALCIUM -VITAMIN D) 500-200 MG-UNIT per tablet Take 1 tablet by mouth daily.   cholecalciferol (VITAMIN D) 1000 units tablet Take 1,000 Units by mouth daily.   Multiple Vitamin (MULTIVITAMIN) capsule Take 1 capsule by mouth daily.   vitamin B-12 (CYANOCOBALAMIN) 1000 MCG tablet Take 1,000 mcg by mouth daily.   [DISCONTINUED] ezetimibe  (ZETIA ) 10 MG tablet Take 1 tablet (10 mg total) by mouth daily.   [DISCONTINUED] levothyroxine  (SYNTHROID ) 100 MCG tablet Take 1 tablet (100 mcg total) by mouth daily. Take with 36mcg=188mcg   [DISCONTINUED] levothyroxine  (SYNTHROID ) 88 MCG tablet Take 1 tablet (88 mcg total) by mouth daily. Take with 156mcg=188mcg daily   [DISCONTINUED] rosuvastatin  (CRESTOR ) 5 MG tablet TAKE 1 TABLET(5 MG) BY MOUTH DAILY       09/18/2023    8:09 AM 09/25/2022    8:05 AM 03/27/2022    8:59 AM 11/28/2021   11:47 AM  GAD 7 : Generalized Anxiety Score  Nervous, Anxious, on Edge 0 0 0 0  Control/stop worrying 0 0 0 0  Worry too much - different things 0 0 0 0  Trouble relaxing 0 0 0 0  Restless 0 0 0 0  Easily annoyed or irritable 0 0 0 0   Afraid - awful might happen 0 0 0 0  Total GAD 7 Score 0 0 0 0  Anxiety Difficulty Not difficult at all Not difficult at all Not difficult at all Not difficult at all       09/18/2023    8:09 AM 04/01/2023    8:40 AM 09/25/2022    8:05 AM  Depression screen PHQ 2/9  Decreased Interest 0 0 0  Down, Depressed, Hopeless 0 0 0  PHQ - 2 Score 0 0 0  Altered sleeping 0  0  Tired, decreased energy 0  0  Change in appetite 0  0  Feeling bad or failure about yourself  0  0  Trouble concentrating 0  0  Moving slowly or fidgety/restless 0  0  Suicidal thoughts 0  0  PHQ-9 Score 0  0  Difficult doing work/chores Not difficult at all  Not difficult at  all    BP Readings from Last 3 Encounters:  09/18/23 98/62  04/01/23 117/78  09/25/22 120/76    Physical Exam Vitals and nursing note reviewed.  Constitutional:      General: She is not in acute distress.    Appearance: She is not diaphoretic.  HENT:     Head: Normocephalic and atraumatic.     Right Ear: Tympanic membrane, ear canal and external ear normal.     Left Ear: Tympanic membrane, ear canal and external ear normal.     Nose: Nose normal.     Mouth/Throat:     Mouth: Mucous membranes are moist.  Eyes:     General:        Right eye: No discharge.        Left eye: No discharge.     Conjunctiva/sclera: Conjunctivae normal.     Pupils: Pupils are equal, round, and reactive to light.  Neck:     Thyroid : No thyromegaly.     Vascular: No JVD.  Cardiovascular:     Rate and Rhythm: Normal rate and regular rhythm.     Heart sounds: Normal heart sounds. No murmur heard.    No friction rub. No gallop.  Pulmonary:     Effort: Pulmonary effort is normal.     Breath sounds: Normal breath sounds.  Abdominal:     General: Bowel sounds are normal.     Palpations: Abdomen is soft. There is no mass.     Tenderness: There is no abdominal tenderness. There is no guarding.  Musculoskeletal:        General: Normal range of motion.      Cervical back: Normal range of motion and neck supple.  Lymphadenopathy:     Cervical: No cervical adenopathy.  Skin:    General: Skin is warm and dry.  Neurological:     Mental Status: She is alert.     Deep Tendon Reflexes: Reflexes are normal and symmetric.     Wt Readings from Last 3 Encounters:  09/18/23 181 lb (82.1 kg)  04/01/23 170 lb 4.8 oz (77.2 kg)  09/25/22 167 lb (75.8 kg)    BP 98/62   Pulse 66   Ht 5' 7.5" (1.715 m)   Wt 181 lb (82.1 kg)   SpO2 100%   BMI 27.93 kg/m   Assessment and Plan:  1. Mixed hyperlipidemia (Primary) Chronic.  Controlled.  Stable.  Asymptomatic with all myalgias or muscle weakness.  Continue Zetia  10 mg once a day and rosuvastatin  5 mg once a day.  Will check lipid panel for current level of LDL control and CMP for hepatic surveillance. - ezetimibe  (ZETIA ) 10 MG tablet; Take 1 tablet (10 mg total) by mouth daily.  Dispense: 30 tablet; Refill: 5 - rosuvastatin  (CRESTOR ) 5 MG tablet; Take 1 tablet (5 mg total) by mouth daily.  Dispense: 30 tablet; Refill: 5 - Lipid Panel With LDL/HDL Ratio - Comprehensive metabolic panel with GFR  2. Acquired hypothyroidism Chronic.  Controlled.  Stable.  Patient's on a combination of a 188 mcg combination.  We will recheck TSH to see if this is sufficiently supplementing. - levothyroxine  (SYNTHROID ) 100 MCG tablet; Take 1 tablet (100 mcg total) by mouth daily. Take with 37mcg=188mcg  Dispense: 30 tablet; Refill: 5 - levothyroxine  (SYNTHROID ) 88 MCG tablet; Take 1 tablet (88 mcg total) by mouth daily. Take with 158mcg=188mcg daily  Dispense: 30 tablet; Refill: 5 - TSH  3. Encounter for screening mammogram for malignant neoplasm of  breast Discussed with patient and mammogram diagnostic ordered. - MM 3D SCREENING MAMMOGRAM BILATERAL BREAST   Alayne Allis, MD

## 2023-09-19 ENCOUNTER — Encounter: Payer: Self-pay | Admitting: Family Medicine

## 2023-09-19 LAB — COMPREHENSIVE METABOLIC PANEL WITH GFR
ALT: 21 IU/L (ref 0–32)
AST: 23 IU/L (ref 0–40)
Albumin: 4.1 g/dL (ref 3.8–4.9)
Alkaline Phosphatase: 93 IU/L (ref 44–121)
BUN/Creatinine Ratio: 30 — ABNORMAL HIGH (ref 12–28)
BUN: 20 mg/dL (ref 8–27)
Bilirubin Total: 0.4 mg/dL (ref 0.0–1.2)
CO2: 22 mmol/L (ref 20–29)
Calcium: 9.7 mg/dL (ref 8.7–10.3)
Chloride: 103 mmol/L (ref 96–106)
Creatinine, Ser: 0.67 mg/dL (ref 0.57–1.00)
Globulin, Total: 2.4 g/dL (ref 1.5–4.5)
Glucose: 92 mg/dL (ref 70–99)
Potassium: 5.3 mmol/L — ABNORMAL HIGH (ref 3.5–5.2)
Sodium: 139 mmol/L (ref 134–144)
Total Protein: 6.5 g/dL (ref 6.0–8.5)
eGFR: 100 mL/min/{1.73_m2} (ref >59)

## 2023-09-19 LAB — LIPID PANEL WITH LDL/HDL RATIO
Cholesterol, Total: 165 mg/dL (ref 100–199)
HDL: 74 mg/dL (ref >39)
LDL Chol Calc (NIH): 81 mg/dL (ref 0–99)
LDL/HDL Ratio: 1.1 ratio (ref 0.0–3.2)
Triglycerides: 49 mg/dL (ref 0–149)
VLDL Cholesterol Cal: 10 mg/dL (ref 5–40)

## 2023-09-19 LAB — TSH: TSH: 0.027 u[IU]/mL — ABNORMAL LOW (ref 0.450–4.500)

## 2023-11-03 ENCOUNTER — Inpatient Hospital Stay: Admission: RE | Admit: 2023-11-03 | Source: Ambulatory Visit

## 2023-12-26 ENCOUNTER — Encounter: Payer: Self-pay | Admitting: Student

## 2024-01-02 ENCOUNTER — Ambulatory Visit (INDEPENDENT_AMBULATORY_CARE_PROVIDER_SITE_OTHER): Admitting: Student

## 2024-01-02 ENCOUNTER — Encounter: Payer: Self-pay | Admitting: Student

## 2024-01-02 VITALS — BP 112/70 | HR 68 | Ht 67.5 in | Wt 182.0 lb

## 2024-01-02 DIAGNOSIS — E039 Hypothyroidism, unspecified: Secondary | ICD-10-CM | POA: Diagnosis not present

## 2024-01-02 NOTE — Assessment & Plan Note (Signed)
 Last TSH 0.027 on 09/18/2023 and levothyroxine  was decreased from 188 mcg daily to 100 mcg daily, now having symptoms of hypothyroidism. Also mild hyperkalemia of 5.3. suspect symptoms are due to under replacement of thyroid  hormone. Will check TSH, cmp , and CBC today and adjust level of synthroid  based on results.

## 2024-01-02 NOTE — Progress Notes (Signed)
 Established Patient Office Visit  Subjective   Patient ID: Karen Franco, female    DOB: 20-Apr-1961  Age: 63 y.o. MRN: [id number]  Chief Complaint  Patient presents with   Thyroid  Problem    Medication dose change, patient reports mot feeling well, muscle spasms recently     Karen Franco with medical hx listed below presents today for follow up of acquired hypothyroid s/p thyroidectomy for benign thyroid  mass. Reports she was on levothyroxine  188 mcg daily until last visit with Dr. Joshua in May and decreased to 88 mcg daily. Has been feeling more fatigued, cold, constipated, about 10 lb weight gain, and muslce cramping after physical activity.   Patient Active Problem List   Diagnosis Date Noted   H/O total hysterectomy 07/17/2020   Polyp of sigmoid colon    History of colonic polyps 02/14/2020   Liver lesion, right lobe 05/06/2019   Abnormal MRI, thoracic spine 05/06/2019   Melanoma in situ of pinna of left ear (HCC) 05/06/2019   S/P laparoscopic cholecystectomy 03/02/2019   Acute cholecystitis 03/01/2019   Special screening for malignant neoplasms, colon    Benign neoplasm of ascending colon    Acquired hypothyroidism 12/07/2014   Taking medication for chronic disease 12/07/2014      ROS Refer to HPI    Objective:     Outpatient Encounter Medications as of 01/02/2024  Medication Sig   Calcium  Carbonate-Vitamin D (CALCIUM -VITAMIN D) 500-200 MG-UNIT per tablet Take 1 tablet by mouth daily.   cholecalciferol (VITAMIN D) 1000 units tablet Take 1,000 Units by mouth daily.   ezetimibe  (ZETIA ) 10 MG tablet Take 1 tablet (10 mg total) by mouth daily.   levothyroxine  (SYNTHROID ) 88 MCG tablet Take 1 tablet (88 mcg total) by mouth daily. Take with 165mcg=188mcg daily   Multiple Vitamin (MULTIVITAMIN) capsule Take 1 capsule by mouth daily.   rosuvastatin  (CRESTOR ) 5 MG tablet Take 1 tablet (5 mg total) by mouth daily.   vitamin B-12 (CYANOCOBALAMIN) 1000 MCG tablet  Take 1,000 mcg by mouth daily.   levothyroxine  (SYNTHROID ) 100 MCG tablet Take 1 tablet (100 mcg total) by mouth daily. Take with 62mcg=188mcg (Patient not taking: Reported on 01/02/2024)   No facility-administered encounter medications on file as of 01/02/2024.    BP 112/70   Pulse 68   Ht 5' 7.5 (1.715 m)   Wt 182 lb (82.6 kg)   SpO2 99%   BMI 28.08 kg/m  BP Readings from Last 3 Encounters:  01/02/24 112/70  09/18/23 98/62  04/01/23 117/78    Physical Exam Constitutional:      Appearance: Normal appearance.  HENT:     Mouth/Throat:     Mouth: Mucous membranes are moist.     Pharynx: Oropharynx is clear.  Neck:     Comments: Thyroidectomy scar Cardiovascular:     Rate and Rhythm: Normal rate and regular rhythm.  Pulmonary:     Effort: Pulmonary effort is normal.     Breath sounds: No rhonchi or rales.  Abdominal:     General: Abdomen is flat. Bowel sounds are normal. There is no distension.     Palpations: Abdomen is soft.     Tenderness: There is no abdominal tenderness.  Musculoskeletal:        General: Normal range of motion.     Right lower leg: No edema.     Left lower leg: No edema.  Skin:    General: Skin is warm and dry.     Capillary  Refill: Capillary refill takes less than 2 seconds.  Neurological:     General: No focal deficit present.     Mental Status: She is alert and oriented to person, place, and time.  Psychiatric:        Mood and Affect: Mood normal.        Behavior: Behavior normal.        09/18/2023    8:09 AM 04/01/2023    8:40 AM 09/25/2022    8:05 AM  Depression screen PHQ 2/9  Decreased Interest 0 0 0  Down, Depressed, Hopeless 0 0 0  PHQ - 2 Score 0 0 0  Altered sleeping 0  0  Tired, decreased energy 0  0  Change in appetite 0  0  Feeling bad or failure about yourself  0  0  Trouble concentrating 0  0  Moving slowly or fidgety/restless 0  0  Suicidal thoughts 0  0  PHQ-9 Score 0  0  Difficult doing work/chores Not difficult  at all  Not difficult at all       09/18/2023    8:09 AM 09/25/2022    8:05 AM 03/27/2022    8:59 AM 11/28/2021   11:47 AM  GAD 7 : Generalized Anxiety Score  Nervous, Anxious, on Edge 0 0 0 0  Control/stop worrying 0 0 0 0  Worry too much - different things 0 0 0 0  Trouble relaxing 0 0 0 0  Restless 0 0 0 0  Easily annoyed or irritable 0 0 0 0  Afraid - awful might happen 0 0 0 0  Total GAD 7 Score 0 0 0 0  Anxiety Difficulty Not difficult at all Not difficult at all Not difficult at all Not difficult at all    No results found for any visits on 01/02/24.  Last CBC Lab Results  Component Value Date   WBC 7.0 01/16/2022   HGB 13.8 01/16/2022   HCT 42.1 01/16/2022   MCV 87.2 01/16/2022   MCH 28.6 01/16/2022   RDW 14.7 01/16/2022   PLT 335 01/16/2022   Last metabolic panel Lab Results  Component Value Date   GLUCOSE 92 09/18/2023   NA 139 09/18/2023   K 5.3 (H) 09/18/2023   CL 103 09/18/2023   CO2 22 09/18/2023   BUN 20 09/18/2023   CREATININE 0.67 09/18/2023   EGFR 100 09/18/2023   CALCIUM  9.7 09/18/2023   PHOS 4.1 07/27/2019   PROT 6.5 09/18/2023   ALBUMIN 4.1 09/18/2023   LABGLOB 2.4 09/18/2023   AGRATIO 1.7 09/25/2022   BILITOT 0.4 09/18/2023   ALKPHOS 93 09/18/2023   AST 23 09/18/2023   ALT 21 09/18/2023   ANIONGAP 9 03/11/2022   Last lipids Lab Results  Component Value Date   CHOL 165 09/18/2023   HDL 74 09/18/2023   LDLCALC 81 09/18/2023   TRIG 49 09/18/2023   CHOLHDL 3.2 10/07/2017   Last hemoglobin A1c Lab Results  Component Value Date   HGBA1C 5.1 09/25/2022   Last thyroid  functions Lab Results  Component Value Date   TSH 0.027 (L) 09/18/2023   T4TOTAL 9.8 09/25/2022      The 10-year ASCVD risk score (Arnett DK, et al., 2019) is: 2%    Assessment & Plan:  Acquired hypothyroidism Assessment & Plan: Last TSH 0.027 on 09/18/2023 and levothyroxine  was decreased from 188 mcg daily to 100 mcg daily, now having symptoms of  hypothyroidism. Also mild hyperkalemia of 5.3. suspect symptoms are due  to under replacement of thyroid  hormone. Will check TSH, cmp , and CBC today and adjust level of synthroid  based on results.   Orders: -     TSH + free T4 -     CBC with Differential/Platelet -     Iron, TIBC and Ferritin Panel -     Comprehensive metabolic panel with GFR     No follow-ups on file.    Harlene Saddler, MD

## 2024-01-03 LAB — COMPREHENSIVE METABOLIC PANEL WITH GFR
ALT: 23 IU/L (ref 0–32)
AST: 27 IU/L (ref 0–40)
Albumin: 4.3 g/dL (ref 3.9–4.9)
Alkaline Phosphatase: 93 IU/L (ref 44–121)
BUN/Creatinine Ratio: 28 (ref 12–28)
BUN: 21 mg/dL (ref 8–27)
Bilirubin Total: 0.2 mg/dL (ref 0.0–1.2)
CO2: 25 mmol/L (ref 20–29)
Calcium: 9.9 mg/dL (ref 8.7–10.3)
Chloride: 103 mmol/L (ref 96–106)
Creatinine, Ser: 0.76 mg/dL (ref 0.57–1.00)
Globulin, Total: 2.3 g/dL (ref 1.5–4.5)
Glucose: 83 mg/dL (ref 70–99)
Potassium: 5.2 mmol/L (ref 3.5–5.2)
Sodium: 139 mmol/L (ref 134–144)
Total Protein: 6.6 g/dL (ref 6.0–8.5)
eGFR: 89 mL/min/1.73 (ref >59)

## 2024-01-03 LAB — IRON,TIBC AND FERRITIN PANEL
Ferritin: 111 ng/mL (ref 15–150)
Iron Saturation: 15 % (ref 15–55)
Iron: 41 ug/dL (ref 27–139)
Total Iron Binding Capacity: 270 ug/dL (ref 250–450)
UIBC: 229 ug/dL (ref 118–369)

## 2024-01-03 LAB — CBC WITH DIFFERENTIAL/PLATELET
Basophils Absolute: 0 x10E3/uL (ref 0.0–0.2)
Basos: 1 %
EOS (ABSOLUTE): 0.1 x10E3/uL (ref 0.0–0.4)
Eos: 2 %
Hematocrit: 44.1 % (ref 34.0–46.6)
Hemoglobin: 14.2 g/dL (ref 11.1–15.9)
Immature Grans (Abs): 0 x10E3/uL (ref 0.0–0.1)
Immature Granulocytes: 0 %
Lymphocytes Absolute: 1.8 x10E3/uL (ref 0.7–3.1)
Lymphs: 25 %
MCH: 30.6 pg (ref 26.6–33.0)
MCHC: 32.2 g/dL (ref 31.5–35.7)
MCV: 95 fL (ref 79–97)
Monocytes Absolute: 0.4 x10E3/uL (ref 0.1–0.9)
Monocytes: 5 %
Neutrophils Absolute: 4.9 x10E3/uL (ref 1.4–7.0)
Neutrophils: 67 %
Platelets: 309 x10E3/uL (ref 150–450)
RBC: 4.64 x10E6/uL (ref 3.77–5.28)
RDW: 13.3 % (ref 11.7–15.4)
WBC: 7.3 x10E3/uL (ref 3.4–10.8)

## 2024-01-03 LAB — TSH+FREE T4
Free T4: 1.26 ng/dL (ref 0.82–1.77)
TSH: 12.2 u[IU]/mL — ABNORMAL HIGH (ref 0.450–4.500)

## 2024-01-05 ENCOUNTER — Ambulatory Visit: Payer: Self-pay | Admitting: Student

## 2024-01-05 MED ORDER — LEVOTHYROXINE SODIUM 137 MCG PO TABS: 150.0000 ug | ORAL_TABLET | Freq: Every day | ORAL | 0 refills | Status: DC

## 2024-02-09 ENCOUNTER — Other Ambulatory Visit: Payer: Self-pay

## 2024-02-09 ENCOUNTER — Other Ambulatory Visit: Payer: Self-pay | Admitting: Student

## 2024-02-09 DIAGNOSIS — E782 Mixed hyperlipidemia: Secondary | ICD-10-CM

## 2024-02-09 MED ORDER — EZETIMIBE 10 MG PO TABS: 10.0000 mg | ORAL_TABLET | Freq: Every day | ORAL | 1 refills | Status: DC

## 2024-02-11 NOTE — Telephone Encounter (Signed)
 Refilling for 90 days.  Requested Prescriptions  Pending Prescriptions Disp Refills   ezetimibe  (ZETIA ) 10 MG tablet [Pharmacy Med Name: EZETIMIBE  10MG  TABLETS] 90 tablet 0    Sig: TAKE 1 TABLET(10 MG) BY MOUTH DAILY     Cardiovascular:  Antilipid - Sterol Transport Inhibitors Failed - 02/11/2024  8:50 AM      Failed - Lipid Panel in normal range within the last 12 months    Cholesterol, Total  Date Value Ref Range Status  09/18/2023 165 100 - 199 mg/dL Final   LDL Chol Calc (NIH)  Date Value Ref Range Status  09/18/2023 81 0 - 99 mg/dL Final   HDL  Date Value Ref Range Status  09/18/2023 74 >39 mg/dL Final   Triglycerides  Date Value Ref Range Status  09/18/2023 49 0 - 149 mg/dL Final         Passed - AST in normal range and within 360 days    AST  Date Value Ref Range Status  01/02/2024 27 0 - 40 IU/L Final         Passed - ALT in normal range and within 360 days    ALT  Date Value Ref Range Status  01/02/2024 23 0 - 32 IU/L Final         Passed - Patient is not pregnant      Passed - Valid encounter within last 12 months    Recent Outpatient Visits           1 month ago Acquired hypothyroidism   [facility], Harlene, MD   4 months ago Mixed hyperlipidemia   [facility] Joshua Cathryne JAYSON, MD

## 2024-03-07 ENCOUNTER — Other Ambulatory Visit: Payer: Self-pay | Admitting: Student

## 2024-03-09 ENCOUNTER — Ambulatory Visit (INDEPENDENT_AMBULATORY_CARE_PROVIDER_SITE_OTHER): Admitting: Student

## 2024-03-09 ENCOUNTER — Encounter: Payer: Self-pay | Admitting: Student

## 2024-03-09 VITALS — BP 116/74 | HR 83 | Ht 67.5 in | Wt 197.0 lb

## 2024-03-09 DIAGNOSIS — E785 Hyperlipidemia, unspecified: Secondary | ICD-10-CM | POA: Insufficient documentation

## 2024-03-09 DIAGNOSIS — Z23 Encounter for immunization: Secondary | ICD-10-CM | POA: Diagnosis not present

## 2024-03-09 DIAGNOSIS — E7800 Pure hypercholesterolemia, unspecified: Secondary | ICD-10-CM

## 2024-03-09 DIAGNOSIS — E782 Mixed hyperlipidemia: Secondary | ICD-10-CM

## 2024-03-09 DIAGNOSIS — E039 Hypothyroidism, unspecified: Secondary | ICD-10-CM

## 2024-03-09 MED ORDER — ROSUVASTATIN CALCIUM 10 MG PO TABS: 10.0000 mg | ORAL_TABLET | Freq: Every day | ORAL | 1 refills | Status: AC

## 2024-03-09 NOTE — Assessment & Plan Note (Addendum)
 Felling better on increased levothyroxine  137 mcg daily. TSH today. Adjust levothyroxine  as needed based on labs.

## 2024-03-09 NOTE — Progress Notes (Signed)
 Established Patient Office Visit  Subjective   Patient ID: Karen Franco, female    DOB: Oct 06, 1960  Age: 64 y.o. MRN: [id number]  Chief Complaint  Patient presents with   Establish Care    Heron Macario Baumler with medical hx listed below presents today for transfer of care. Formerly seeing Dr. Joshua prior to retiring. No acute complaints today. Please refer to problem based charting for further details and assessment and plan of current problem and chronic medical conditions.   Patient Active Problem List   Diagnosis Date Noted   HLD (hyperlipidemia) 03/09/2024   H/O total hysterectomy 07/17/2020   History of colonic polyps 02/14/2020   Melanoma in situ of pinna of left ear (HCC) 05/06/2019   Acquired hypothyroidism 12/07/2014      ROS Refer to HPI    Objective:     Outpatient Encounter Medications as of 03/09/2024  Medication Sig   Calcium  Carbonate-Vitamin D (CALCIUM -VITAMIN D) 500-200 MG-UNIT per tablet Take 1 tablet by mouth daily.   cholecalciferol (VITAMIN D) 1000 units tablet Take 1,000 Units by mouth daily.   levothyroxine  (SYNTHROID ) 137 MCG tablet Take 1 tablet (137 mcg total) by mouth daily.   Multiple Vitamin (MULTIVITAMIN) capsule Take 1 capsule by mouth daily.   vitamin B-12 (CYANOCOBALAMIN) 1000 MCG tablet Take 1,000 mcg by mouth daily.   [DISCONTINUED] ezetimibe  (ZETIA ) 10 MG tablet TAKE 1 TABLET(10 MG) BY MOUTH DAILY   [DISCONTINUED] rosuvastatin  (CRESTOR ) 5 MG tablet Take 1 tablet (5 mg total) by mouth daily.   rosuvastatin  (CRESTOR ) 10 MG tablet Take 1 tablet (10 mg total) by mouth daily.   No facility-administered encounter medications on file as of 03/09/2024.    BP 116/74   Pulse 83   Ht 5' 7.5 (1.715 m)   Wt 197 lb (89.4 kg)   SpO2 98%   BMI 30.40 kg/m  BP Readings from Last 3 Encounters:  03/09/24 116/74  01/02/24 112/70  09/18/23 98/62    Physical Exam Constitutional:      Appearance: Normal appearance.  HENT:      Mouth/Throat:     Mouth: Mucous membranes are moist.     Pharynx: Oropharynx is clear.  Cardiovascular:     Rate and Rhythm: Normal rate and regular rhythm.  Pulmonary:     Effort: Pulmonary effort is normal.     Breath sounds: No rhonchi or rales.  Abdominal:     General: Abdomen is flat. Bowel sounds are normal. There is no distension.     Palpations: Abdomen is soft.     Tenderness: There is no abdominal tenderness.  Musculoskeletal:        General: Normal range of motion.     Right lower leg: No edema.     Left lower leg: No edema.  Skin:    General: Skin is warm and dry.     Capillary Refill: Capillary refill takes less than 2 seconds.  Neurological:     General: No focal deficit present.     Mental Status: She is alert and oriented to person, place, and time.  Psychiatric:        Mood and Affect: Mood normal.        Behavior: Behavior normal.        03/09/2024    8:03 AM 09/18/2023    8:09 AM 04/01/2023    8:40 AM  Depression screen PHQ 2/9  Decreased Interest 0 0 0  Down, Depressed, Hopeless 0 0 0  PHQ -  2 Score 0 0 0  Altered sleeping 1 0   Tired, decreased energy 0 0   Change in appetite 0 0   Feeling bad or failure about yourself  0 0   Trouble concentrating 0 0   Moving slowly or fidgety/restless 0 0   Suicidal thoughts 0 0   PHQ-9 Score 1 0   Difficult doing work/chores Not difficult at all Not difficult at all        03/09/2024    8:04 AM 09/18/2023    8:09 AM 09/25/2022    8:05 AM 03/27/2022    8:59 AM  GAD 7 : Generalized Anxiety Score  Nervous, Anxious, on Edge 0 0 0 0  Control/stop worrying 0 0 0 0  Worry too much - different things 0 0 0 0  Trouble relaxing 0 0 0 0  Restless 0 0 0 0  Easily annoyed or irritable 0 0 0 0  Afraid - awful might happen 0 0 0 0  Total GAD 7 Score 0 0 0 0  Anxiety Difficulty Not difficult at all Not difficult at all Not difficult at all Not difficult at all    No results found for any visits on 03/09/24.  Last  CBC Lab Results  Component Value Date   WBC 7.3 01/02/2024   HGB 14.2 01/02/2024   HCT 44.1 01/02/2024   MCV 95 01/02/2024   MCH 30.6 01/02/2024   RDW 13.3 01/02/2024   PLT 309 01/02/2024   Last metabolic panel Lab Results  Component Value Date   GLUCOSE 83 01/02/2024   NA 139 01/02/2024   K 5.2 01/02/2024   CL 103 01/02/2024   CO2 25 01/02/2024   BUN 21 01/02/2024   CREATININE 0.76 01/02/2024   EGFR 89 01/02/2024   CALCIUM  9.9 01/02/2024   PHOS 4.1 07/27/2019   PROT 6.6 01/02/2024   ALBUMIN 4.3 01/02/2024   LABGLOB 2.3 01/02/2024   AGRATIO 1.7 09/25/2022   BILITOT 0.2 01/02/2024   ALKPHOS 93 01/02/2024   AST 27 01/02/2024   ALT 23 01/02/2024   ANIONGAP 9 03/11/2022   Last lipids Lab Results  Component Value Date   CHOL 165 09/18/2023   HDL 74 09/18/2023   LDLCALC 81 09/18/2023   TRIG 49 09/18/2023   CHOLHDL 3.2 10/07/2017   Last hemoglobin A1c Lab Results  Component Value Date   HGBA1C 5.1 09/25/2022   Last thyroid  functions Lab Results  Component Value Date   TSH 12.200 (H) 01/02/2024   T4TOTAL 9.8 09/25/2022   FREET4 1.26 01/02/2024      The 10-year ASCVD risk score (Arnett DK, et al., 2019) is: 2.2%    Assessment & Plan:  Acquired hypothyroidism Assessment & Plan: Felling better on increased levothyroxine  137 mcg daily. TSH today. Adjust levothyroxine  as needed based on labs.  Orders: -     TSH + free T4  Pure hypercholesterolemia Assessment & Plan: Well controlled on rosuvastatin  5 mg and zetia  10 mg daily. Tolerating well without side effects. Has not been on higher dose of statin. Will increase rosuvastatin  and stop zetia  to simplify regimen.    Mixed hyperlipidemia Assessment & Plan: Well controlled on rosuvastatin  5 mg and zetia  10 mg daily. Tolerating well without side effects. Has not been on higher dose of statin. Will increase rosuvastatin  and stop zetia  to simplify regimen.   Orders: -     Rosuvastatin  Calcium ; Take 1 tablet  (10 mg total) by mouth daily.  Dispense: 90 tablet;  Refill: 1  Encounter for immunization -     Pneumococcal conjugate vaccine 20-valent -     Flu vaccine trivalent PF, 6mos and older(Flulaval,Afluria,Fluarix,Fluzone)     Return in about 6 months (around 09/07/2024) for physical.    Harlene Saddler, MD

## 2024-03-09 NOTE — Addendum Note (Signed)
 Addended by: LEMON RAISIN on: 03/09/2024 11:13 AM   Modules accepted: Level of Service

## 2024-03-09 NOTE — Patient Instructions (Signed)
 Please call to schedule your mammogram at [phone]

## 2024-03-09 NOTE — Telephone Encounter (Signed)
 Requested medications are due for refill today.  yes  Requested medications are on the active medications list.  yes  Last refill. 01/05/2024 #60 0 rf  Future visit scheduled.   today  Notes to clinic.  Abnormal labs. Rx written to expire today 03/09/2024.    Requested Prescriptions  Pending Prescriptions Disp Refills   levothyroxine  (SYNTHROID ) 137 MCG tablet [Pharmacy Med Name: LEVOTHYROXINE  0.137MG  ( ) TAB] 60 tablet 0    Sig: TAKE 1 TABLET(137 MCG) BY MOUTH DAILY     Endocrinology:  Hypothyroid Agents Failed - 03/09/2024 12:23 PM      Failed - TSH in normal range and within 360 days    TSH  Date Value Ref Range Status  01/02/2024 12.200 (H) 0.450 - 4.500 uIU/mL Final         Passed - Valid encounter within last 12 months    Recent Outpatient Visits           Today Acquired hypothyroidism   [place] [facility], [place]   2 months ago Acquired hypothyroidism   [place] [facility] at [facility], [place]   5 months ago Mixed hyperlipidemia   [facility] at [facility], [place]

## 2024-03-09 NOTE — Assessment & Plan Note (Addendum)
 Well controlled on rosuvastatin  5 mg and zetia  10 mg daily. Tolerating well without side effects. Has not been on higher dose of statin. Will increase rosuvastatin  and stop zetia  to simplify regimen.

## 2024-03-10 ENCOUNTER — Ambulatory Visit: Payer: Self-pay | Admitting: Student

## 2024-03-10 ENCOUNTER — Other Ambulatory Visit: Payer: Self-pay

## 2024-03-10 LAB — TSH+FREE T4
Free T4: 1.39 ng/dL (ref 0.82–1.77)
TSH: 8.75 u[IU]/mL — ABNORMAL HIGH (ref 0.450–4.500)

## 2024-05-04 ENCOUNTER — Ambulatory Visit
Admission: RE | Admit: 2024-05-04 | Discharge: 2024-05-04 | Disposition: A | Discharge status: Home / Self Care | Source: Ambulatory Visit | Attending: Family Medicine | Admitting: Family Medicine

## 2024-05-04 DIAGNOSIS — Z1231 Encounter for screening mammogram for malignant neoplasm of breast: Secondary | ICD-10-CM | POA: Diagnosis present

## 2024-05-11 ENCOUNTER — Ambulatory Visit: Payer: Self-pay | Admitting: Family Medicine

## 2024-05-18 ENCOUNTER — Other Ambulatory Visit: Payer: Self-pay | Admitting: Student

## 2024-05-19 NOTE — Telephone Encounter (Signed)
 Requested Prescriptions  Pending Prescriptions Disp Refills   levothyroxine  (SYNTHROID ) 137 MCG tablet [Pharmacy Med Name: LEVOTHYROXINE  0.137MG  ( ) TAB] 90 tablet 0    Sig: TAKE 1 TABLET(137 MCG) BY MOUTH DAILY     Endocrinology:  Hypothyroid Agents Failed - 05/19/2024  2:56 PM      Failed - TSH in normal range and within 360 days    TSH  Date Value Ref Range Status  03/09/2024 8.750 (H) 0.450 - 4.500 uIU/mL Final         Passed - Valid encounter within last 12 months    Recent Outpatient Visits           2 months ago Acquired hypothyroidism   [place] [facility] at [facility], [place]   4 months ago Acquired hypothyroidism   [place] [facility] at [facility], [place]   8 months ago Mixed hyperlipidemia   [facility] at [facility], [place]

## 2024-09-07 ENCOUNTER — Encounter: Admitting: Student
